# Patient Record
Sex: Female | Born: 1970 | Race: White | Hispanic: No | Marital: Married | Smoking: Never smoker
Health system: Southern US, Community
[De-identification: ages and names within clinical notes are randomized; demographics above are authoritative.]

## PROBLEM LIST (undated history)

## (undated) DIAGNOSIS — Z95810 Presence of automatic (implantable) cardiac defibrillator: Secondary | ICD-10-CM

## (undated) DIAGNOSIS — I472 Ventricular tachycardia: Secondary | ICD-10-CM

## (undated) DIAGNOSIS — R000 Tachycardia, unspecified: Secondary | ICD-10-CM

## (undated) DIAGNOSIS — F329 Major depressive disorder, single episode, unspecified: Secondary | ICD-10-CM

## (undated) DIAGNOSIS — F319 Bipolar disorder, unspecified: Secondary | ICD-10-CM

## (undated) DIAGNOSIS — T1491XA Suicide attempt, initial encounter: Secondary | ICD-10-CM

## (undated) DIAGNOSIS — I428 Other cardiomyopathies: Secondary | ICD-10-CM

## (undated) DIAGNOSIS — I10 Essential (primary) hypertension: Secondary | ICD-10-CM

## (undated) DIAGNOSIS — F419 Anxiety disorder, unspecified: Secondary | ICD-10-CM

## (undated) DIAGNOSIS — F32A Depression, unspecified: Secondary | ICD-10-CM

## (undated) DIAGNOSIS — Z9289 Personal history of other medical treatment: Secondary | ICD-10-CM

## (undated) DIAGNOSIS — M1990 Unspecified osteoarthritis, unspecified site: Secondary | ICD-10-CM

## (undated) HISTORY — DX: Tachycardia, unspecified: R00.0

## (undated) HISTORY — DX: Suicide attempt, initial encounter: T14.91XA

## (undated) HISTORY — DX: Unspecified osteoarthritis, unspecified site: M19.90

## (undated) HISTORY — DX: Other cardiomyopathies: I42.8

## (undated) HISTORY — PX: CARDIAC CATHETERIZATION: SHX172

## (undated) HISTORY — DX: Major depressive disorder, single episode, unspecified: F32.9

## (undated) HISTORY — PX: NEUROPLASTY / TRANSPOSITION MEDIAN NERVE AT CARPAL TUNNEL BILATERAL: SUR894

## (undated) HISTORY — DX: Ventricular tachycardia: I47.2

## (undated) HISTORY — DX: Depression, unspecified: F32.A

## (undated) HISTORY — DX: Anxiety disorder, unspecified: F41.9

---

## 1997-04-01 HISTORY — PX: TUBAL LIGATION: SHX77

## 1998-08-19 ENCOUNTER — Emergency Department (HOSPITAL_COMMUNITY): Admission: EM | Admit: 1998-08-19 | Discharge: 1998-08-19 | Payer: Self-pay | Admitting: Emergency Medicine

## 2001-05-07 ENCOUNTER — Encounter: Payer: Self-pay | Admitting: Emergency Medicine

## 2001-05-07 ENCOUNTER — Emergency Department (HOSPITAL_COMMUNITY): Admission: EM | Admit: 2001-05-07 | Discharge: 2001-05-07 | Payer: Self-pay | Admitting: Emergency Medicine

## 2001-12-22 ENCOUNTER — Inpatient Hospital Stay (HOSPITAL_COMMUNITY): Admission: EM | Admit: 2001-12-22 | Discharge: 2001-12-23 | Payer: Self-pay | Admitting: Emergency Medicine

## 2001-12-22 ENCOUNTER — Encounter: Payer: Self-pay | Admitting: Emergency Medicine

## 2002-04-18 ENCOUNTER — Emergency Department (HOSPITAL_COMMUNITY): Admission: EM | Admit: 2002-04-18 | Discharge: 2002-04-18 | Payer: Self-pay | Admitting: Internal Medicine

## 2002-04-18 ENCOUNTER — Encounter: Payer: Self-pay | Admitting: *Deleted

## 2002-07-06 ENCOUNTER — Encounter: Payer: Self-pay | Admitting: Emergency Medicine

## 2002-07-06 ENCOUNTER — Emergency Department (HOSPITAL_COMMUNITY): Admission: EM | Admit: 2002-07-06 | Discharge: 2002-07-06 | Payer: Self-pay | Admitting: Emergency Medicine

## 2002-07-14 ENCOUNTER — Ambulatory Visit (HOSPITAL_COMMUNITY): Admission: RE | Admit: 2002-07-14 | Discharge: 2002-07-14 | Payer: Self-pay | Admitting: Orthopaedic Surgery

## 2002-07-14 ENCOUNTER — Encounter: Payer: Self-pay | Admitting: Orthopaedic Surgery

## 2002-09-02 ENCOUNTER — Ambulatory Visit (HOSPITAL_COMMUNITY): Admission: RE | Admit: 2002-09-02 | Discharge: 2002-09-02 | Payer: Self-pay | Admitting: Orthopaedic Surgery

## 2002-09-02 ENCOUNTER — Encounter: Payer: Self-pay | Admitting: Orthopaedic Surgery

## 2002-09-27 ENCOUNTER — Emergency Department (HOSPITAL_COMMUNITY): Admission: EM | Admit: 2002-09-27 | Discharge: 2002-09-27 | Payer: Self-pay | Admitting: Emergency Medicine

## 2002-09-27 ENCOUNTER — Encounter: Payer: Self-pay | Admitting: Emergency Medicine

## 2002-10-14 ENCOUNTER — Encounter: Payer: Self-pay | Admitting: Family Medicine

## 2002-10-14 ENCOUNTER — Ambulatory Visit (HOSPITAL_COMMUNITY): Admission: RE | Admit: 2002-10-14 | Discharge: 2002-10-14 | Payer: Self-pay | Admitting: Family Medicine

## 2003-01-02 ENCOUNTER — Emergency Department (HOSPITAL_COMMUNITY): Admission: EM | Admit: 2003-01-02 | Discharge: 2003-01-02 | Payer: Self-pay | Admitting: Emergency Medicine

## 2003-01-02 ENCOUNTER — Encounter: Payer: Self-pay | Admitting: Emergency Medicine

## 2003-05-11 ENCOUNTER — Encounter (HOSPITAL_COMMUNITY): Admission: RE | Admit: 2003-05-11 | Discharge: 2003-06-10 | Payer: Self-pay | Admitting: Preventative Medicine

## 2003-05-30 ENCOUNTER — Ambulatory Visit (HOSPITAL_COMMUNITY): Admission: RE | Admit: 2003-05-30 | Discharge: 2003-05-30 | Payer: Self-pay | Admitting: Preventative Medicine

## 2003-06-30 ENCOUNTER — Ambulatory Visit (HOSPITAL_COMMUNITY): Admission: RE | Admit: 2003-06-30 | Discharge: 2003-06-30 | Payer: Self-pay | Admitting: Orthopaedic Surgery

## 2003-08-02 ENCOUNTER — Ambulatory Visit (HOSPITAL_COMMUNITY): Admission: RE | Admit: 2003-08-02 | Discharge: 2003-08-02 | Payer: Self-pay | Admitting: Orthopaedic Surgery

## 2003-09-12 ENCOUNTER — Encounter (HOSPITAL_COMMUNITY): Admission: RE | Admit: 2003-09-12 | Discharge: 2003-10-12 | Payer: Self-pay | Admitting: Orthopaedic Surgery

## 2003-09-22 ENCOUNTER — Emergency Department (HOSPITAL_COMMUNITY): Admission: EM | Admit: 2003-09-22 | Discharge: 2003-09-22 | Payer: Self-pay | Admitting: Emergency Medicine

## 2003-12-10 ENCOUNTER — Emergency Department (HOSPITAL_COMMUNITY): Admission: EM | Admit: 2003-12-10 | Discharge: 2003-12-10 | Payer: Self-pay | Admitting: Emergency Medicine

## 2004-02-06 ENCOUNTER — Ambulatory Visit (HOSPITAL_COMMUNITY): Admission: RE | Admit: 2004-02-06 | Discharge: 2004-02-06 | Payer: Self-pay | Admitting: Orthopaedic Surgery

## 2004-03-18 ENCOUNTER — Emergency Department (HOSPITAL_COMMUNITY): Admission: EM | Admit: 2004-03-18 | Discharge: 2004-03-18 | Payer: Self-pay | Admitting: Emergency Medicine

## 2004-08-19 ENCOUNTER — Emergency Department: Payer: Self-pay | Admitting: General Practice

## 2004-08-23 ENCOUNTER — Inpatient Hospital Stay (HOSPITAL_COMMUNITY): Admission: RE | Admit: 2004-08-23 | Discharge: 2004-08-28 | Payer: Self-pay | Admitting: Orthopaedic Surgery

## 2004-08-28 ENCOUNTER — Inpatient Hospital Stay: Admission: AD | Admit: 2004-08-28 | Discharge: 2004-08-30 | Payer: Self-pay | Admitting: Internal Medicine

## 2004-10-30 ENCOUNTER — Ambulatory Visit (HOSPITAL_COMMUNITY): Admission: RE | Admit: 2004-10-30 | Discharge: 2004-10-30 | Payer: Self-pay | Admitting: Orthopaedic Surgery

## 2005-05-30 ENCOUNTER — Emergency Department (HOSPITAL_COMMUNITY): Admission: EM | Admit: 2005-05-30 | Discharge: 2005-05-30 | Payer: Self-pay | Admitting: Emergency Medicine

## 2005-05-31 ENCOUNTER — Ambulatory Visit (HOSPITAL_COMMUNITY): Admission: RE | Admit: 2005-05-31 | Discharge: 2005-05-31 | Payer: Self-pay | Admitting: Emergency Medicine

## 2005-06-16 ENCOUNTER — Emergency Department (HOSPITAL_COMMUNITY): Admission: EM | Admit: 2005-06-16 | Discharge: 2005-06-16 | Payer: Self-pay

## 2005-08-29 ENCOUNTER — Emergency Department (HOSPITAL_COMMUNITY): Admission: EM | Admit: 2005-08-29 | Discharge: 2005-08-29 | Payer: Self-pay | Admitting: Emergency Medicine

## 2005-09-02 IMAGING — CR DG ANKLE COMPLETE 3+V*L*
1 series · 4 of 4 positions shown · non-contrast
Comparison: none

REASON FOR EXAM: Fall
COMMENTS:  LMP: 4 wks ago

[Series 1: view not recorded · 0.17mm/px · 4 of 4 slices shown]
[im 1/4]
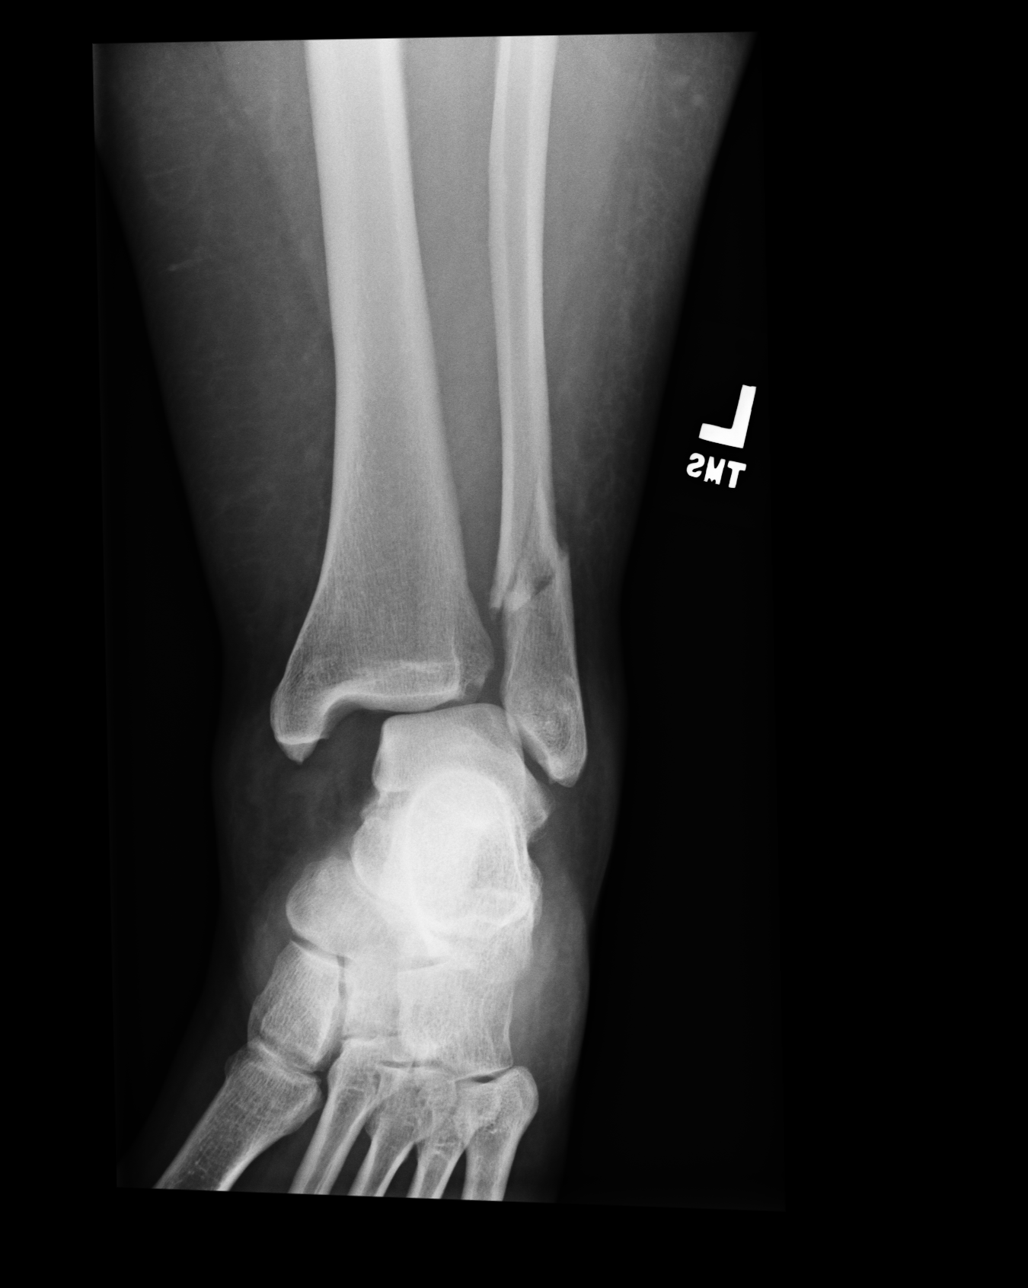
[im 2/4]
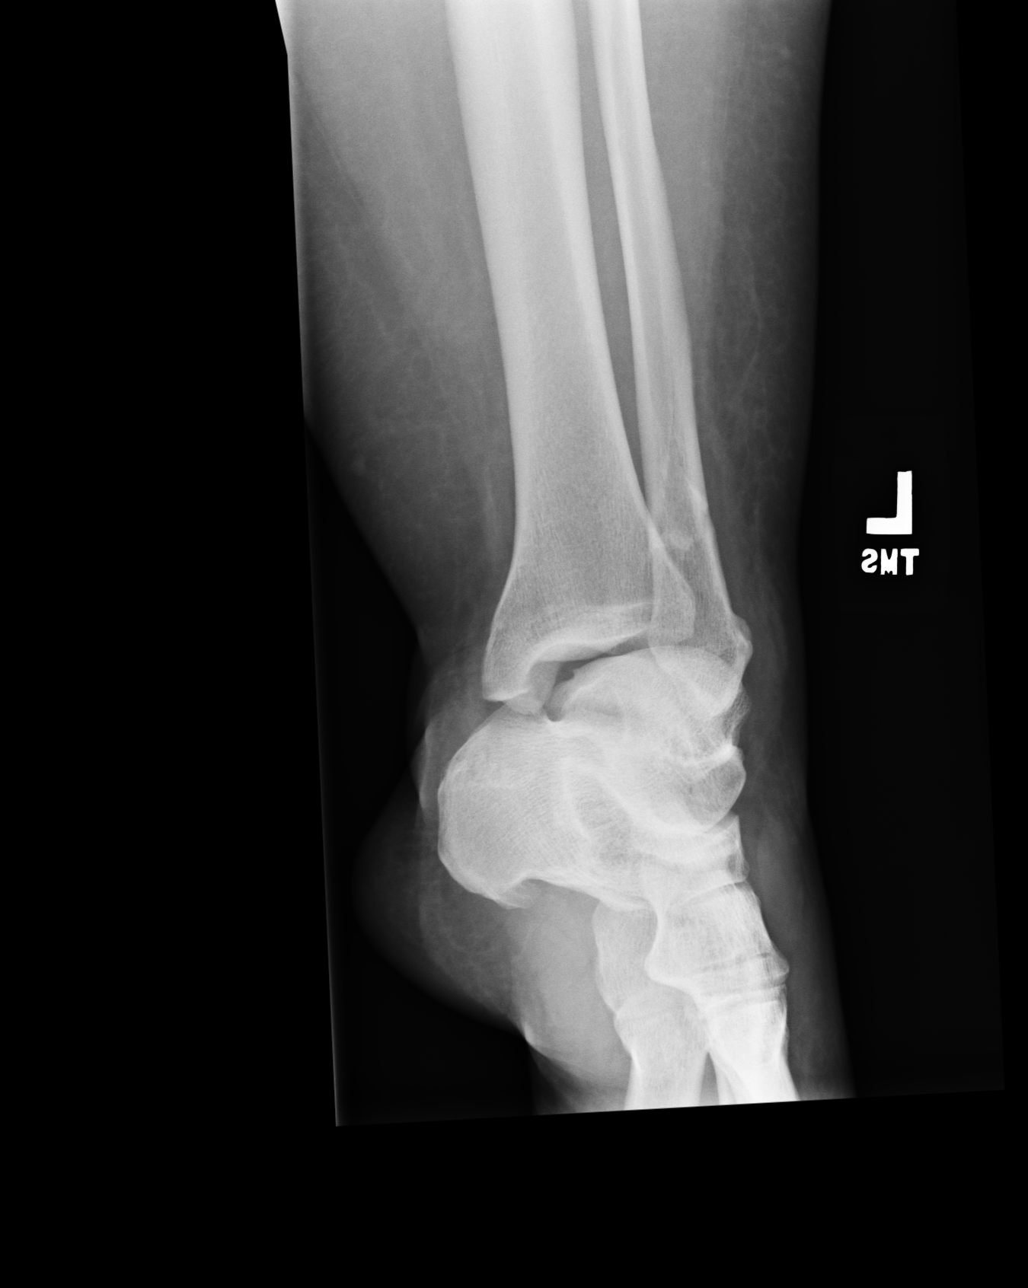
[im 3/4]
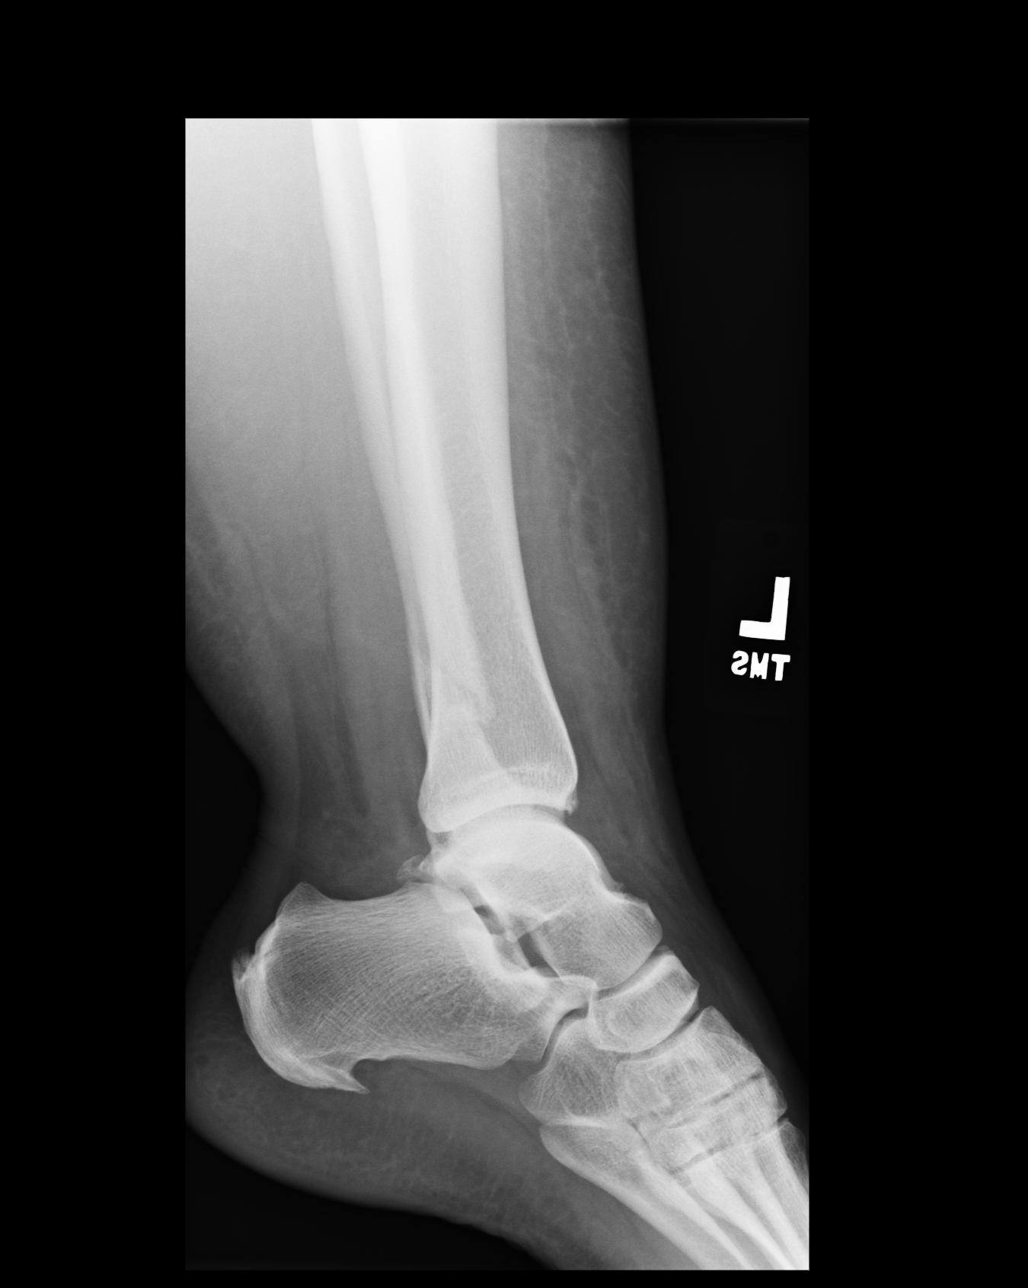
[im 4/4]
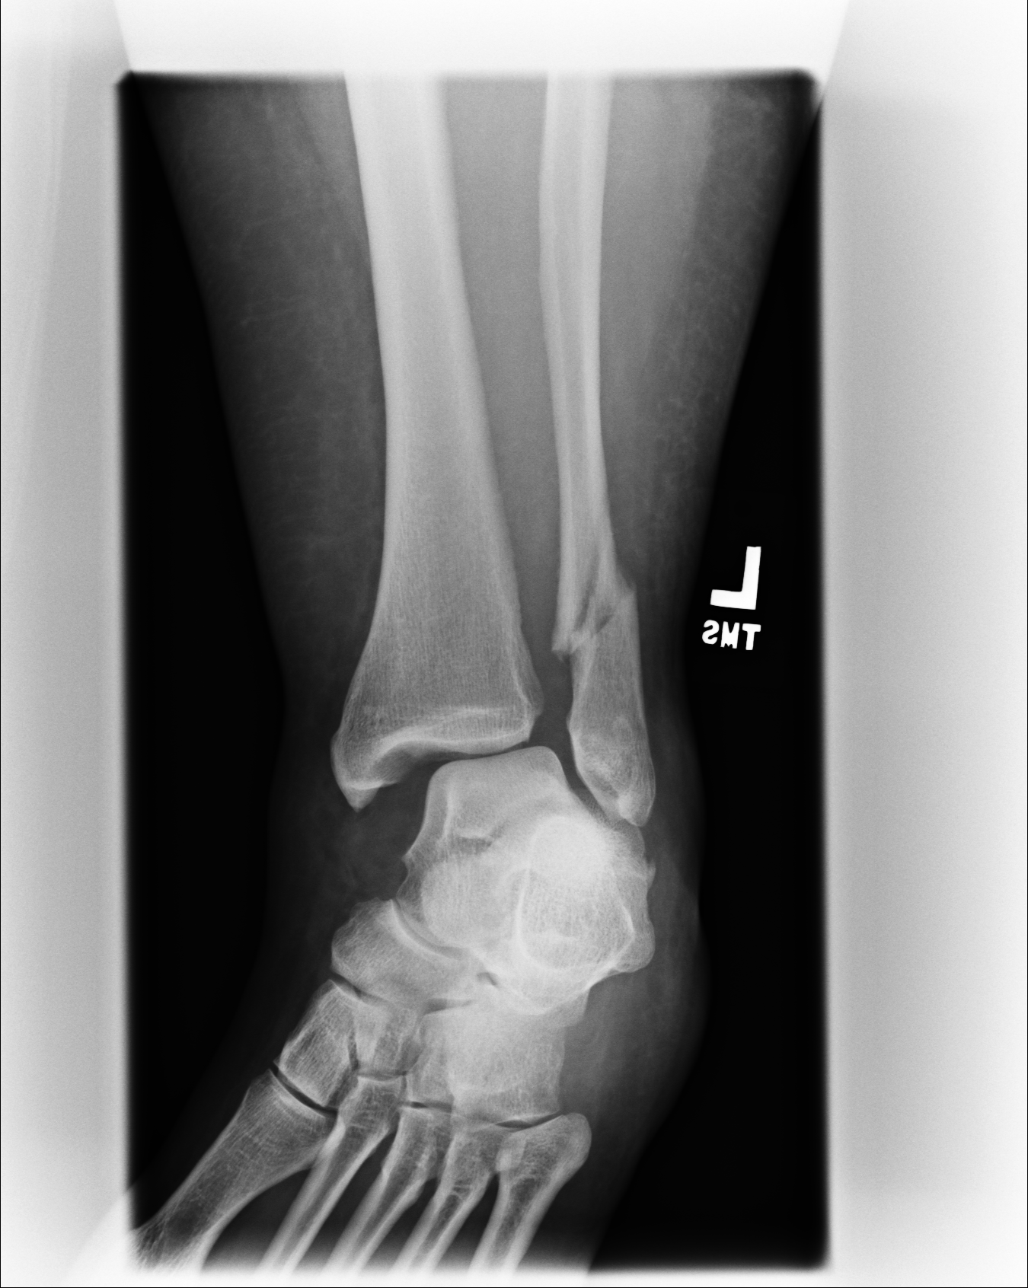

[4 of 4 positions shown; findings below may reference images not displayed]

PROCEDURE:     DXR - DXR ANKLE LEFT COMPLETE  - August 19, 2004  [DATE]

RESULT:     A comminuted spiral fracture is demonstrated along the distal
shaft of the fibula.  This demonstrates intraarticular involvement that
extends to the distal tibiofibular joint.  The distal fibula demonstrates
mild lateral displacement.  The tibia demonstrates medial displacement with
medial angulation.  There is destruction of the ankle mortise.  Degenerative
changes are demonstrated involving the calcaneus evident by spurring of the
Achilles insertion and along the plantar aspect of the calcaneus.  No
further fractures or dislocations are appreciated.
IMPRESSION: See above.

## 2005-09-07 ENCOUNTER — Emergency Department (HOSPITAL_COMMUNITY): Admission: EM | Admit: 2005-09-07 | Discharge: 2005-09-07 | Payer: Self-pay | Admitting: Emergency Medicine

## 2006-06-29 ENCOUNTER — Emergency Department (HOSPITAL_COMMUNITY): Admission: EM | Admit: 2006-06-29 | Discharge: 2006-06-29 | Payer: Self-pay | Admitting: Emergency Medicine

## 2008-07-14 ENCOUNTER — Emergency Department (HOSPITAL_COMMUNITY): Admission: EM | Admit: 2008-07-14 | Discharge: 2008-07-14 | Payer: Self-pay | Admitting: Emergency Medicine

## 2008-07-16 ENCOUNTER — Emergency Department (HOSPITAL_COMMUNITY): Admission: EM | Admit: 2008-07-16 | Discharge: 2008-07-16 | Payer: Self-pay | Admitting: Emergency Medicine

## 2009-09-17 ENCOUNTER — Emergency Department (HOSPITAL_COMMUNITY): Admission: EM | Admit: 2009-09-17 | Discharge: 2009-09-17 | Payer: Self-pay | Admitting: Emergency Medicine

## 2010-07-07 ENCOUNTER — Emergency Department (HOSPITAL_COMMUNITY)
Admission: EM | Admit: 2010-07-07 | Discharge: 2010-07-07 | Disposition: A | Discharge status: Home / Self Care | Payer: Medicaid Other | Attending: Emergency Medicine | Admitting: Emergency Medicine

## 2010-07-07 DIAGNOSIS — IMO0002 Reserved for concepts with insufficient information to code with codable children: Secondary | ICD-10-CM | POA: Insufficient documentation

## 2010-07-07 DIAGNOSIS — N61 Mastitis without abscess: Secondary | ICD-10-CM | POA: Insufficient documentation

## 2010-07-11 LAB — CULTURE, ROUTINE-ABSCESS

## 2010-07-11 LAB — GLUCOSE, CAPILLARY: Glucose-Capillary: 98 mg/dL (ref 70–99)

## 2010-08-17 ENCOUNTER — Emergency Department (HOSPITAL_COMMUNITY)
Admission: EM | Admit: 2010-08-17 | Discharge: 2010-08-17 | Disposition: A | Discharge status: Home / Self Care | Payer: Medicaid Other | Attending: Emergency Medicine | Admitting: Emergency Medicine

## 2010-08-17 DIAGNOSIS — I10 Essential (primary) hypertension: Secondary | ICD-10-CM | POA: Insufficient documentation

## 2010-08-17 DIAGNOSIS — IMO0002 Reserved for concepts with insufficient information to code with codable children: Secondary | ICD-10-CM | POA: Insufficient documentation

## 2010-08-17 DIAGNOSIS — Z79899 Other long term (current) drug therapy: Secondary | ICD-10-CM | POA: Insufficient documentation

## 2010-08-17 DIAGNOSIS — F319 Bipolar disorder, unspecified: Secondary | ICD-10-CM | POA: Insufficient documentation

## 2010-08-17 NOTE — Discharge Summary (Signed)
NAME:  Karla Anderson, Karla Anderson             ACCOUNT NO.:  [id number]   MEDICAL RECORD NO.:  [id number]          PATIENT TYPE:  INP   LOCATION:  A330                          FACILITY:  [facility]   PHYSICIAN:  Vickki Hearing, M.D.DATE OF BIRTH:  Jul 29, 1970   DATE OF ADMISSION:  08/23/2004  DATE OF DISCHARGE:  05/30/2006LH                                 DISCHARGE SUMMARY   ADMISSION DIAGNOSES:  1.  Weber C type fracture, left ankle with lateral malleolus fracture.  2.  Syndesmosis rupture and medial deltoid ligament tear.   DISCHARGE DIAGNOSES:  1.  Weber C type fracture, left ankle with lateral malleolus fracture.  2.  Syndesmosis rupture and medial deltoid ligament tear.   OPERATIVE PROCEDURE:  Open treatment internal fixation of left ankle with  syndesmosis screw placement and repair of medial deltoid ligament.   SURGEON:  J. Darreld Mclean, M.D.   ASSISTANT:  Lolita Cram, [facility].   ANESTHESIA:  General.   HISTORY OF PRESENT ILLNESS:  This is a 40 year old white female admitted by  Dr. Hilda Lias for fracture left ankle. The patient was seen in the office of  Dr. Hilda Lias on Aug 21, 2004. She injured her ankle on Aug 18, 2004. She was  stepping out of a door at home that had some new construction work and fell.  The patient has other medical problems including irregular heart beat,  hypertension, and depression/anxiety. Has a history of carpal tunnel release  on the right and left by Dr. Hilda Lias, bilateral tubal ligation, dental  surgery and cesarean section.   ADMISSION MEDICATIONS:  She was on Naprosyn, Vicodin ES, Zoloft, Topamax,  and Geodon.   ALLERGIES:  None.   HOSPITAL COURSE:  The patient was admitted on the date stated. Underwent  surgery on Aug 23, 2004. Had a lateral plate and repair of the medial  deltoid ligament and a syndesmosis screw placed. She tolerated the procedure  well and was brought back to the floor. Prior to surgery a medical  consultation was done to assist  in the patient's medical problems and their  management. His consultation was done by Dr. Vania Rea. She  progressed well with physical therapy and at the time of discharge, was  afebrile. Vital signs were stable. The pain was reasonably controlled with  Lortab 1.5 tablets every 4 hours for pain.   DISCHARGE MEDICATIONS:  1.  Tenormin 25 mg daily.  2.  Lovenox 40 mg subcutaneous q. 24. This should continue for a period of      20 days.  3.  Lorcet Plus 1 every 4 hours for pain.  4.  Zoloft 150 mg daily orally.  5.  Topamax 150 mg orally b.i.d.  6.  Geodon 60 mg daily at night.  7.  Ambien 10 mg orally q.h.s.   FOLLOW UP:  This patient should be seen in followup by Dr. Hilda Lias in  approximately 7 days.   DISPOSITION:  The patient is discharged to the [facility].   SPECIAL INSTRUCTIONS:  She is non-weight bearing. She should see physical  therapy daily for gait assistance and training. While recumbent,  legs should  be elevated. Any questions about the patient's care should be referred to  [phone], Dr. Sanjuan Dame office.   CONDITION ON DISCHARGE:  Overall condition is improved. Ambulatory with a  walker and standby assistance.      SEH/MEDQ  D:  08/28/2004  T:  08/28/2004  Job:  [id number]   cc:   Teola Bradley, M.D.  [phone] [address]  [place] [address]  Fax: [phone]   Vickki Hearing, M.D.  Fax: [phone]   [facility]

## 2010-08-17 NOTE — Op Note (Signed)
NAME:  Karla Anderson, Karla Anderson             ACCOUNT NO.:  [id number]   MEDICAL RECORD NO.:  [id number]          PATIENT TYPE:  AMB   LOCATION:  DAY                           FACILITY:  [facility]   PHYSICIAN:  J. Darreld Mclean, M.D. DATE OF BIRTH:  September 25, 1970   DATE OF PROCEDURE:  08/23/2004  DATE OF DISCHARGE:                                 OPERATIVE REPORT   PREOPERATIVE DIAGNOSES:  1.  Fracture lateral malleolus of the left ankle.  2.  Syndesmosis rupture, left ankle.  3.  Medial deltoid ligament tear, left ankle.   POSTOPERATIVE DIAGNOSES:  1.  Fracture lateral malleolus of the left ankle.  2.  Syndesmosis rupture, left ankle.  3.  Medial deltoid ligament tear, left ankle.   OPERATION/PROCEDURE:  1.  OTIR left ankle.  2.  Syndesmosis screw placement.  3.  Repair of the medial deltoid ligament.   SURGEON:  J. Darreld Mclean, M.D.   ASSISTANT:  Lolita Cram, P.A-C.   ANESTHESIA:  General.   DRAINS:  No drains used.  Posterior spinal applied.   INDICATIONS FOR PHYSICIAN ASSISTANT:  This is a small community hospital.  There is no house staff.  The use of a physician's assistant is medically  indicated for this rather difficult procedure.   INDICATIONS FOR PROCEDURE:  The patient is a 40 year old female, morbidly  obese. Sustained the above-mentioned injury over the weekend.  She was seen  at an outlying hospital.  We saw her in the office on Tuesday.  The patient  has significant injury.  She is morbidly obese and was tentatively scheduled  for bariatric-type surgery at [facility] later this year.  She is at high risk.  Risks and imponderables of the procedure were discussed with the patient in  detail.  The patient appeared to understand and agreed to the procedure as  outlined.   DESCRIPTION OF PROCEDURE:  The patient was seen in the holding area  identified.  The left ankle was identified as the surgical site.  The  patient had already placed a mark. I placed a mark.  The patient was  brought  to the operating room and given general anesthesia while supine. A  tourniquet placed, deflated, on the left upper thigh.  Prepped and draped in  the usual manner.  Time out was held.  Identified the left ankle as the  correct surgical site and identified the patient as Karla Anderson.  The leg  was then elevated and wrapped circumferentially with an Esmarch bandage,  tourniquet inflated to 350 mmHg.  Esmarch bandage was removed.  The incision  was made medially.  The deltoid ligament tear was identified. Tags were then  placed with #1 pop-off Surgilon suture laterally.  This was opened and  fracture was identified, reduced and a 7-hole plate was used with a locking  screw proximal.  The metal screw hole was left without a screw in it, just  at the fracture site.  I was going to use this as a syndesmosis screw.   Attention directed to the medial side.  Pulled back the medial deltoid  ligament with repair.  We placed this syndesmosis screw. It was a little low  and I repositioned it more anteriorly.  It was brought in the syndesmosis.  However, the medial joint mortise was still a little widened and we opened  up the repair slightly, looked in.  There was nothing blocking it.  There  were no fragments.  We reclosed this.  Wounds were reapproximated using 2-00  chromic and 0 chromic, 2-0 plain and skin staples on both sides.  Tourniquet  was left one hour and 20 minutes.  Bulky dressing applied.  Posterior splint  applied.  The patient tolerated the procedure well and went to recovery in  good condition.  She is admitted.      JWK/MEDQ  D:  08/23/2004  T:  08/23/2004  Job:  [id number]

## 2010-08-17 NOTE — H&P (Signed)
NAME:  Karla Anderson, Karla Anderson                       ACCOUNT NO.:  [id number]   MEDICAL RECORD NO.:  [id number]                   PATIENT TYPE:  EMS   LOCATION:  ED                                   FACILITY:  [facility]   PHYSICIAN:  Gracelyn Nurse, M.D.              DATE OF BIRTH:  02/14/1971   DATE OF ADMISSION:  12/22/2001  DATE OF DISCHARGE:                                HISTORY & PHYSICAL   CHIEF COMPLAINT:  Chest pain and palpitations.   HISTORY OF PRESENT ILLNESS:  This is a 40 year old white female who has a  known history of supraventricular tachycardia.  She presents today with a  one-day history of chest pain and palpitations.  She said this came on after  an argument with her ex-husband.  She has been taking Cardizem in the past  to control this, but she has been out of these medications for about a month  now.  She has no other symptoms associated with this.   PAST MEDICAL HISTORY:  1. Supraventricular tachycardia.  2. Heart murmur.  3. Hypertension.  4. Hyperlipidemia.  5. Status post C-section x 1.   ALLERGIES:  No known drug allergies.   MEDICATIONS:  1. Cardizem, dose unknown.  2. Benicar, dose unknown.   SOCIAL HISTORY:  She stopped smoking cigarettes two years ago.  Drinks  alcohol occasionally.  Does no illicit drugs.  She is divorced.  One child.  Works at [employer].   FAMILY HISTORY:  Her father is age 71.  He has had an MI and a stroke.  Mother, age 15, is generally healthy.  She has one sister who is generally  healthy.   REVIEW OF SYSTEMS:  As per HPI.  Also significant for weight gain, stress,  chronic nausea.  All other systems are reviewed and are normal.   PHYSICAL EXAMINATION:  VITAL SIGNS:  Temperature 100.7, pulse 145,  respirations 20, blood pressure 143/70.  GENERAL:  This is a morbidly obese white female in no acute distress.  HEENT:  Pupils are equal, round, and reactive to light.  Extraocular  movements intact.  Oral mucosa is moist.   Oropharynx is clear.  Poor  dentition.  CARDIOVASCULAR:  Tachycardic.  Very distant heart sounds.  There does appear  to be a 1/6 systolic murmur.  LUNGS:  Clear to auscultation.  ABDOMEN:  Obese.  Nontender, nondistended.  Bowel sounds are positive.  EXTREMITIES:  With 1+ lower extremity edema.  NEUROLOGIC:  Cranial nerves II-XII grossly intact.  Strength is 5/5  bilaterally.  SKIN:  Moist.  No rashes.   ADMISSION LABORATORY DATA:  White blood count 9.7, hemoglobin 12.2,  platelets 242.  Sodium 137, potassium 4.2, chloride 105, CO2 27, BUN 6,  creatinine 1.0, glucose 115.  CK is 124 with an MB fraction of 0.6 and  troponin I is 0.02.   EKG shows an supraventricular tachycardia with a rate of  124 and what  appears to be a left bundle branch block.   ASSESSMENT AND PLAN:  1. Supraventricular tachycardia:  The patient was given IV bolus of Cardizem     in the ER and placed on a Cardizem drip.  Heart rate has slowed down to     the low 100s.  Chest pain has been relieved.  Will go ahead and start the     patient on oral Cardizem and wean her off the drip.  I will go ahead and     cycle her cardiac enzymes, though I have low suspicion that this was from     coronary artery disease.  Will go ahead and consult cardiology since she     is followed by Dr. Graciela Husbands at [facility].  2. Hypertension:  Will monitor it once Cardizem is restarted as it appeared     to be controlled on that in the past.  3. Low-grade fever:  Will check a chest x-ray and a UA.                                               Gracelyn Nurse, M.D.    JDJ/MEDQ  D:  12/22/2001  T:  12/22/2001  Job:  [id number]

## 2010-08-17 NOTE — Op Note (Signed)
NAME:  Karla Anderson, Karla Anderson                       ACCOUNT NO.:  [id number]   MEDICAL RECORD NO.:  [id number]                   PATIENT TYPE:  AMB   LOCATION:  DAY                                  FACILITY:  [facility]   PHYSICIAN:  J. Darreld Mclean, M.D.              DATE OF BIRTH:  [birth year]   DATE OF PROCEDURE:  DATE OF DISCHARGE:                                 OPERATIVE REPORT   PREOPERATIVE DIAGNOSIS:  Carpal tunnel syndrome, left.   POSTOPERATIVE DIAGNOSIS:  Carpal tunnel syndrome, left.   PROCEDURE:  1. Release of volar carpal ligament, saline neurolysis.  2. Aponeurotomy left median nerve.   ANESTHESIA:  Bier block--please refer to anesthesia record for tourniquet  time.   SURGEON:  J. Darreld Mclean, M.D.   ASSISTANT:  Candace Cruise, [facility].   INDICATIONS:  The patient has pain and tenderness in the left hand, median  nerve distribution.  Nerve conduction velocities and EMGs showed carpal  tunnel syndrome bilaterally.  She underwent right carpal tunnel release  several weeks ago and now is here for her left.  She understands the  procedure, risks, and imponderables.   DESCRIPTION OF PROCEDURE:  The patient was placed supine on the operating  room table with the hand table attached.  The patient had Bier block  anesthesia given by anesthesia.  She was prepped and draped in the usual  manner.  A __________ incision was made.  We identified the left side as the  correct site; and identified the patient was Karla Anderson.   Incision was made with careful dissection; the median nerve was identified  proximally.  A vessel loop placed around the median nerve.  Saline  neurolysis aponeurotomy carried out.  The volar carpal ligament was incised.  Before doing this __________ .  The retinaculum was cut proximally.  A  specimen of the volar carpal ligament sent to pathology.   Nerve was inspected; no apparent injury.  The wound was then reapproximated  using 3-0 Nylon in an interrupted  vertical mattress manner.  Sterile  dressing applied; bulky dressing applied; sheet cotton applied.  Sheet  cotton cut  dorsally.  Volar plaster splint applied.  Ace bandage applied, loosely.  The  patient tolerated the procedure well and went to recovery in good condition.  Prescription for Vicodin ES given for pain.  I will see her in the office in  approximately 10 days to 2 weeks; if any difficulty, she is to contact me  through the office hospital beeper system.      ___________________________________________                                            Teola Bradley, M.D.   JWK/MEDQ  D:  08/02/2003  T:  08/02/2003  Job:  [id number]

## 2010-08-17 NOTE — Procedures (Signed)
[facility]  Patient:    Karla Anderson, Karla Anderson Visit Number: [id number] MRN: [id number]          Service Type: EMS Location: ED Attending Physician:  Herbert Seta Dictated by:   Kari Baars, M.D. Admit Date:  05/07/2001 Discharge Date: 05/07/2001                            EKG Interpretations  TIME/DATE:  1809, May 07, 2001.  The rhythm is a tachycardic rhythm with a rate about 153.  There appears to be a left bundle branch block.  This is a regular rhythm, and I do not believe this represents ventricular tachycardia, but that is a possibility, and clinical correlation is suggested.  IMPRESSION:  Abnormal electrocardiogram. Dictated by:   Kari Baars, M.D. Attending Physician:  Herbert Seta DD:  05/08/01 TD:  05/09/01 Job: [id number] XL/KG401

## 2010-08-17 NOTE — Op Note (Signed)
NAME:  Karla Anderson, Karla Anderson             ACCOUNT NO.:  [id number]   MEDICAL RECORD NO.:  [id number]          PATIENT TYPE:  AMB   LOCATION:  DAY                           FACILITY:  [facility]   PHYSICIAN:  J. Darreld Mclean, M.D. DATE OF BIRTH:  1970-08-17   DATE OF PROCEDURE:  10/30/2004  DATE OF DISCHARGE:                                 OPERATIVE REPORT   PREOPERATIVE DIAGNOSIS:  Status post fracture of the left ankle, syndesmosis  rupture.   POSTOPERATIVE DIAGNOSIS:  Status post fracture of the left ankle,  syndesmosis rupture.   PROCEDURE:  Removal of syndesmotic screw of the left ankle.   ANESTHESIA:  General.   SURGEON:  Dr. Hilda Lias.   SPECIMENS:  Removed orthopedic screw.   TOURNIQUET TIME:  12 minutes.   DRAINS:  No drains.   INDICATIONS:  The patient is a 40 year old female who is approximately eight  weeks status post syndesmotic rupture of the left ankle who fracture the  lateral malleolus and deltoid ligament rupture. She had repair of all of  these. She is now for removal of the syndesmosis screw. The patient is  morbidly obese and has no other significant medical problems. She is  medically stable and able to undergo the procedure. Risks and imponderables  of the procedure were expressed. The patient appeared to understand. Labs  were within normal limits.   DESCRIPTION OF PROCEDURE:  The patient was seen in the holding area. The  left ankle was identified as the correct surgical site. She placed a mark  over the area and so did I. She was brought back to the operating room and  placed supine on operating table and was given general anesthesia.  Tourniquet placed deflated in the left upper thigh. Prepped and draped in  the usual manner. We had a time out and identified the patient and  identified we were doing the left ankle for removal of the syndesmosis  screw. The leg was elevated, wrapped circumferentially with an Esmarch  bandage, tourniquet was inflated. Esmarch  bandage was removed. With the use  of a C arm, I was able to place a metal device near where the screw was and  made an incision. I made an incision over the skin. There was some what I  could not tell if it was purulent material, but it was not normal appearing  material, and I went ahead and got a culture of that. Screw was identified,  dissected down so I could see the screw, and I removed the screw. Then I  placed some orthopedic putty, bone graft type, in the screw hole. Wounds  were reapproximated using 2-0 chromic and skin staples. Tourniquet deflated  for 12 minutes. Sterile dressing applied. Bulky dressing applied. Ace  bandage  applied. She tolerated the procedure well and will go to recovery in good  condition. She can bear weight on it as tolerated. She has pain medications  at home. We will give her a prescription for Levaquin, pending the results  of the culture.       JWK/MEDQ  D:  10/30/2004  T:  10/30/2004  Job:  [id number]

## 2010-08-17 NOTE — H&P (Signed)
NAME:  Karla Anderson, Karla Anderson                       ACCOUNT NO.:  [id number]   MEDICAL RECORD NO.:  [id number]                   PATIENT TYPE:  AMB   LOCATION:  DAY                                  FACILITY:  [facility]   PHYSICIAN:  J. Darreld Mclean, M.D.              DATE OF BIRTH:  08-30-1970   DATE OF ADMISSION:  DATE OF DISCHARGE:                                HISTORY & PHYSICAL   CHIEF COMPLAINT:  Carpal tunnel syndrome.   Patient is a 40 year old female with pain and tenderness in both hands with  increasing numbness in both hands.  She is right-hand dominant.  She has  been having paresthesias and pain at night and getting progressively worse.  I had seen her in the office recently for low back pain and neck pain.  Because of associated __________ in both hands, I thought she had carpal  tunnel syndrome.  She underwent nerve conduction velocities in Dr. Chriss Driver  office on March 25th.  That shows abnormal nerve conduction velocities  indicative of carpal tunnel syndrome bilaterally, moderate, in both wrists.  Patient says the right hand hurts more than the left and she will have to  have her right hand done first.   Patient has a history of hypertension.  She complains at times that her  heart will race.  She has not been diagnosed as having any significant heart  problems.   CURRENT MEDICATIONS:  1. Zoloft 50 mg once a day.  2. Trazodone 100 mg once daily.  3. Topamax 25 mg 2 tablets b.i.d.  4. Dayspan 5 mg 1 every 6 hours p.r.n. back spasm.  5. Vicodin ES 1 every 4 hours p.r.n. pain.   She denies any allergies.   The patient does not smoke.  She denies alcoholic beverages.  She has a GED  degree.  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   She had a C-section in 1997 and dental surgeries in 2004 as her only  surgeries.   The family has a history of heart disease.  Her father had a stroke.   The patient is divorced.  Lives here in  [place].  Is currently  unemployed.   PHYSICAL EXAMINATION:  VITAL SIGNS:  BP 142/92, pulse 78, respirations 18.  Afebrile.  Height 5 feet 4-1/4.  She weighs over 350 pounds.  GENERAL:  Patient is alert and cooperative.  HEENT:  Negative.  NECK:  Supple.  LUNGS:  Clear to P&A.  HEART:  Regular rate and rhythm without murmur heard.  ABDOMEN:  Negative without masses felt except morbidly obese.  EXTREMITIES:  Decreased sensation in both hands and in the median nerve  distribution.  Positive Tinel's.  Positive Phalen's.  Left and right appear  to be equal.  Range of motion is good.  MUSCULOSKELETAL:  She has lower back pain and neck pain.  NEUROLOGIC:  Reflexes are normal.  DTRs  are normal.  SKIN:  Intact.   IMPRESSION:  1. Bilateral carpal tunnel syndrome, right greater than left.  2. History of low back pain.  3. History of neck pain.  4. History of hypertension.  5. Morbidly obese.   I have discussed with patient the plan and procedure of carpal tunnel  release as an outpatient on the right hand.  She appears to understand the  procedure.  I have discussed the risks and imponderables, including  infection, nerve injury, need for splint, need to keep it dry.  I have  discussed with her about a Bier block anesthesia.  She appears to understand  and agrees to the procedure as outlined.  Labs are pending.     ___________________________________________                                         Teola Bradley, M.D.   JWK/MEDQ  D:  06/29/2003  T:  06/29/2003  Job:  [id number]

## 2010-08-17 NOTE — Consult Note (Signed)
NAME:  Karla Anderson, Karla Anderson                       ACCOUNT NO.:  [id number]   MEDICAL RECORD NO.:  [id number]                   PATIENT TYPE:  INP   LOCATION:  A209                                 FACILITY:  [facility]   PHYSICIAN:  Gerrit Friends. Dietrich Pates, M.D. [facility]        DATE OF BIRTH:  Jul 13, 1970   DATE OF CONSULTATION:  12/22/2001  DATE OF DISCHARGE:                                   CONSULTATION   HISTORY OF PRESENT ILLNESS:  A 40 year old woman previously evaluated by Dr.  Sherryl Manges for a rate-related left bundle branch block, now admitted with  palpitations and chest pain.  The patient was last seen in September 2002,  at which time her symptoms appeared to be relatively well controlled with  atenolol 100 mg b.i.d.  She discontinued this medication a few months ago  without any dramatic change in her symptomatology.  She does have fairly  frequent palpitations.  On an event recorder obtained in 2002, symptoms were  not correlated with her rhythm.  She was admitted to [facility]  through the emergency department after developing recurrent symptoms during  psychologic stress.  She has done well in the hospital but continues to  report palpitations when her rhythm is normal sinus and sinus tachycardia at  a rate of approximately 100 beats per minute.  She has once again  demonstrated her rate-related left bundle,which occurs at a heart rate of  approximately 140.   PAST MEDICAL HISTORY:  Notable for hypertension that has been well  controlled with medical therapy.  She has a known heart murmur.  She has  been obese for some time.  An echocardiogram performed at an outside  physician's office in April 2002 was interpreted as normal.   SOCIAL HISTORY:  Divorced with one daughter; works as a Geophysicist/field seismologist.  Minimal tobacco use which was discontinued two years ago.  No excessive use  of alcohol.  She does have substantial caffeine intake.   FAMILY HISTORY:  Negative for  coronary disease.   REVIEW OF SYSTEMS:  Negative except for some dental problems.   PHYSICAL EXAMINATION:  GENERAL:  A pleasant overweight woman with a shy  demeanor.  VITAL SIGNS:  Heart rate 80 and regular, respirations 18, blood pressure  140/70.  NECK:  No jugular venous distention.  LUNGS:  Clear.  CARDIAC:  Grade 2/6 basilar systolic ejection murmur.  ABDOMEN:  Soft and nontender.  EXTREMITIES:  No edema; normal distal pulses.  ENDOCRINE:  No thyromegaly.  SKIN:  No significant lesions.   LABORATORY DATA:  Electrocardiograms were reviewed; her initial tracing  shows sinus tachycardia with a left bundle branch block.  Subsequent  tracings are normal.   Laboratory notable for hematuria.   IMPRESSION:  No paroxysmal supraventricular tachycardia has been documented  in the past or during this admission.  She once again does demonstrate rate-  related left bundle branch block.  In her case, this is  a benign condition.  I would recommend using beta-blocker on a p.r.n. basis for symptoms.  Benzodiazepines might be helpful as well.  She does not require any  additional hospitalization nor testing.  We will be happy to continue to  assist in this nice woman's care as needed.  A TSH level is pending at the  time of this dictation.                                                Gerrit Friends. Dietrich Pates, M.D. [facility]    RMR/MEDQ  D:  12/22/2001  T:  12/23/2001  Job:  [id number]

## 2010-08-17 NOTE — H&P (Signed)
NAME:  Karla Anderson, Karla Anderson             ACCOUNT NO.:  [id number]   MEDICAL RECORD NO.:  [id number]          PATIENT TYPE:  AMB   LOCATION:  DAY                           FACILITY:  [facility]   PHYSICIAN:  J. Darreld Mclean, M.D. DATE OF BIRTH:  07-30-1970   DATE OF ADMISSION:  DATE OF DISCHARGE:  LH                                HISTORY & PHYSICAL   CHIEF COMPLAINT:  Fracture of the left ankle.   HISTORY OF PRESENT ILLNESS:  The patient is a 40 year old white female who  was to be [facility] to [facility] to undergo removal of syndesmosis screw  from the left ankle.  She is now 8 weeks status post open reduction internal  fixation of a left ankle fracture.  Over the past 8 weeks she has done very  well.  X-rays now show the syndesmosis ligament has healed as well as the  lateral malleolus fracture to the left ankle.   It is now time to start physical therapy and the necessity to remove the  syndesmosis screw from the left ankle.   PAST MEDICAL HISTORY:  Positive for hypertension, depression, anxiety,  irregular heartbeat.   OPERATIONS:  C-section, dental surgery, bilateral tubal ligation, carpal  tunnel syndrome, right and left hand.   MEDICATIONS:  1.  Naprosyn 500 mg b.i.d. p.c.  2.  Flexeril 10 mg q.8h. p.r.n. for muscle spasm.  3.  Ambien 10 mg at bedtime.  4.  Geodon 80 mg 1 tab daily.  5.  Zoloft 100 mg 1 tab daily.  6.  Vicodin ES  1 tab q.4h. p.r.n. for pain.  7.  Tenormin 25 mg daily.   ALLERGIES:  None.   FAMILY/SOCIAL HISTORY:  The patient is divorced.  She has one child.  She  does not use alcohol or tobacco products.  The father has coronary artery  disease and has recently had a stroke.  The mother has a history of  congestive heart failure.  A sister has bipolar disease.   The patient has a GED diploma.   REVIEW OF SYSTEMS:  The patient is followed at the mental health center for  her depression and anxiety.   PHYSICAL EXAMINATION:  GENERAL APPEARANCE:  The  patient is 5 feet 4 inches  and weighs 325 pounds, alert and oriented times three. In general the  patient is an obese white female who is very pleasant and cooperative to  work with.  VITAL SIGNS:  Pulse 80, respirations 20.  The patient is afebrile.  HEENT:  Normal.  The patient has only one central incisor.  Otherwise she is  edentulous.  NECK:  Supple.  No thyromegaly or masses are palpated.  LUNGS:  Clear to A&P.  HEART:  Regular rhythm without murmur.  No organomegaly or masses are  palpated.  Normal bowel sounds are auscultated.  EXTREMITIES:  The left lower extremity is in a posterior splint.  She has  two surgical incisions to the ankle.  The more medial one is a 5-x-5-mm in  the mid portion of the ankle itself.  Laterally the wound is well healed.  There are signs of old fracture and blister to the ankle and foot, and  swelling to the ankle and foot.  Neurovascular is intact.  Range of motion  is limited secondary to immobilization.  The other extremities are within  normal limits.  Neurovascular is intact to them.  CNS:  Intact.  SKIN:  Intact.   IMPRESSION:  1.  Eight weeks status post lateral malleolus fracture and open treatment      and internal fixation thereof.  2.  Eight weeks status post syndesmosis ligament rupture, healed.  3.  Deltoid ligament rupture, left ankle, healed.  4.  A history of depression and hypertension, irregular heartbeat.   PLAN:  The patient is to be admitted to [facility] on October 30, 2004 to  undergo removal of syndesmosis screw from the left ankle.  Labs are pending.      Br   BC/MEDQ  D:  10/24/2004  T:  10/24/2004  Job:  [id number]

## 2010-08-17 NOTE — H&P (Signed)
NAME:  Karla Anderson, Karla Anderson                       ACCOUNT NO.:  [id number]   MEDICAL RECORD NO.:  [id number]                   PATIENT TYPE:  AMB   LOCATION:  DAY                                  FACILITY:  [facility]   PHYSICIAN:  J. Darreld Mclean, M.D.              DATE OF BIRTH:  May 29, 1970   DATE OF ADMISSION:  DATE OF DISCHARGE:                                HISTORY & PHYSICAL   CHIEF COMPLAINT:  Pain and tenderness in the left hand.   HISTORY OF PRESENT ILLNESS:  The patient is a 40 year old white female who  is being admitted through Day Surgery on Aug 02, 2003 to undergo release of  carpal tunnel of the left hand.   The patient has been followed here in the office, for the past several  months, because of carpal tunnel syndrome bilateral hands.  NCVs and EMGs  have shown that she has moderate carpal tunnel syndrome in the bilateral  hands. She most recently underwent carpal tunnel release in her right hand  and is doing very well postoperatively.  She has developed increased pain,  tenderness, and nocturnal numbness and tingling in the left hand.  She  denies carpal tunnel release of the left hand.   She underwent original procedure in the right hand a month ago.  She  __________ with preoperative as well as postoperative complications and  protocol.   PAST MEDICAL HISTORY:  Positive for irregular heart beat, hypertension,  depression, and exogenous obesity.  She denies diabetes mellitus, stroke,  cardiac or respiratory problems.   OPERATIONS:  C-section, bilateral tubal ligation, carpal tunnel release  right hand, dental work.   MEDICATIONS:  1. Zoloft 50 mg 1 tab daily.  2. Trazodone 100 mg every day.  3. Topamax 100 mg b.i.d.  4. __________ , antihypertensive medication, dosage unknown.   LOCAL MEDICAL DOCTOR:  She is followed up by the [facility]  physicians.   FAMILY AND SOCIAL HISTORY:  The patient is divorced.  She does not use  alcohol or tobacco  products.  Her father has had an MI and a stroke, but is  still living. Mother suffered a bout of congestive heart failure, but is  also living. She has 1 sister who has bipolar personality.   REVIEW OF SYSTEMS:  Review of systems is negative except for the following:  The patient has had a history of problems with drugs in the past; at one  time attempted suicide because of this.  She is now followed by the mental  health physicians in [place] in [facility].   PHYSICAL EXAMINATION:  VITAL SIGNS:  The patient is 5 feet 4 inches tall,  weighs 325 pounds, alert and oriented x3.  She is afebrile, pulse is 88,  respirations 12, blood pressure 140/110.  HEENT:  Within normal limits except for absence of multiple teeth.  There is  erythema around the tonsillar  pillars, otherwise negative.  NECK:  Supple.  No thyromegaly or masses palpated.  LUNGS:  Clear to A&P.  HEART:  Regular rhythm without murmur; no cardiomegaly noted.  ABDOMEN:  Abdomen is protuberant, obese, soft and nontender.  No  organomegaly or masses palpated.  Hyperactive bowel sounds are auscultated.  EXTREMITIES:  Left hand has positive Tinel's and Phalen's sign.  She had  full range of motion of the fingers and wrist.  Neurovascular is intact.  Examination of the right hand revealed well-healed surgical incision across  the volar surface of the right wrist.  She had full range of motion of the  fingers and hands. There was slight tenderness around the incision and  swelling in the thenar area of the right palm; otherwise was negative.  Lower extremities neurovascularly intact within normal limits.  NEUROLOGIC:  CNS was intact.  SKIN:  Intact.   IMPRESSION:  1. Carpal tunnel syndrome left hand.  2. Hypertension.  3. History of irregular heart beat.  4. Depression.  5. History of suicidal attempt and drug abuse.   PLAN:  The patient will be admitted to [facility] on Aug 02, 2003 to  undergo  carpal tunnel release of the left hand.  Labs are pending.     _____________________________________  ___________________________________________  Candace Cruise, Doylene Bode, M.D.   BB/MEDQ  D:  08/01/2003  T:  08/01/2003  Job:  [id number]

## 2010-08-17 NOTE — Op Note (Signed)
NAME:  Karla Anderson, Karla Anderson                       ACCOUNT NO.:  [id number]   MEDICAL RECORD NO.:  [id number]                   PATIENT TYPE:  AMB   LOCATION:  DAY                                  FACILITY:  [facility]   PHYSICIAN:  J. Darreld Mclean, M.D.              DATE OF BIRTH:  June 05, 1970   DATE OF PROCEDURE:  DATE OF DISCHARGE:                                 OPERATIVE REPORT   PREOPERATIVE DIAGNOSIS:  Carpal tunnel syndrome, right.   POSTOPERATIVE DIAGNOSIS:  Carpal tunnel syndrome, right.   PROCEDURE:  1. Release of volar carpal tunnel ligament, saline neurolysis.  2. Aponeurotomy right median nerve.   ANESTHESIA:  Bier block--please refer to anesthesia record for tourniquet  time.   DRAINS:  No drains.   SPLINTS:  Volar plaster splint.   SURGEON:  J. Darreld Mclean, M.D.   INDICATIONS:  This patient is a 40 year old female who had nerve conduction  velocities showing bilateral carpal tunnel.  She says the right hand hurts  more and she would like to have that done.  She has decreased sensation in  of the median nerve distribution.  The risks and imponderables were  discussed preoperatively; and she appeared to understand the procedure and  asked appropriate questions.   DESCRIPTION OF PROCEDURE:  The patient was seen in the anesthesia holding  area and the right hand was identified as the correct site for the carpal  tunnel release.  Marks were placed by me and the patient.  The patient was  taken to the operating room and placed supine on the operating room table  with hand table attached.  Bier block anesthesia was given.  We again  reascertained that the right hand was the correct site.   The patient was prepped and draped in the usual manner.  An outline for  incision was made with careful dissection.  The median nerve was identified  proximally.  A vessel loop placed around the nerve.  Then a groove director  placed in the volar carpal space.  The volar carpal  ligament was then  incised.  The nerve was markedly compressed.  The retinaculum was cut  proximally.  A specimen of volar carpal ligament was sent to pathology.   Saline neurolysis aponeurotomy carried out.  Nerve was inspected, no  apparent injury.  The wound was reapproximated with 3-0 Nylon interrupted  vertical mattress manner.  Sterile dressing applied; bulky dressing applied;  sheet cotton applied.  Sheet cotton cut dorsally.  Volar plaster splint  applied.  Ace bandage applied, loosely.  The patient tolerated the procedure well and went to recovery in good  condition.  Prescription for Vicodin ES given for pain.  I will the patient  in the office in approximately 10 days to 2 weeks; if any difficulty, she is  to contact me through the office hospital beeper system.      ___________________________________________  Teola Bradley, M.D.   JWK/MEDQ  D:  06/30/2003  T:  06/30/2003  Job:  [id number]

## 2010-08-17 NOTE — Discharge Summary (Signed)
   NAME:  Karla Anderson, Karla Anderson                       ACCOUNT NO.:  [id number]   MEDICAL RECORD NO.:  [id number]                   PATIENT TYPE:  INP   LOCATION:  A209                                 FACILITY:  [facility]   PHYSICIAN:  Gracelyn Nurse, M.D.              DATE OF BIRTH:  [birth year]   DATE OF ADMISSION:  12/22/2001  DATE OF DISCHARGE:  12/23/2001                                 DISCHARGE SUMMARY   DISCHARGE DIAGNOSES:  1. Sinus tachycardia.     a. Thought to be stress induced.  2. History of heart murmur.  3. Hypertension.  4. Hyperlipidemia.  5. Status post cesarean section x1.   DISCHARGE MEDICATIONS:  1. Atenolol 100 mg p.r.n. not to exceed 2 a day.  2. Xanax 0.25 mg t.i.d. p.r.n.   REASON FOR ADMISSION:  This is a 40 year old white female with known history  of tachycardia.  She has been seen in the past by a cardiologist and worked  up for this.  She is supposed to be on atenolol but ran out of it about a  month ago.  This episode of palpitations and some mild chest pain came on  after an argument with her ex-husband.   HOSPITAL COURSE:  1. SINUS TACHYCARDIA:  Cardiology was consulted and saw her and determined     this to be a sinus tachycardia, not a supraventricular tachycardia.  She     will be placed on atenolol on a p.r.n. basis and perhaps some anxiolytics     also would be helpful.  No further workup is planned at this time.   1. STRESS:  We will prescribed some anxiolytics to help with this.     Recommended that the patient seek counseling also.   1.     MORBID OBESITY:  She was interested in inquiring about bariatric surgery, so     I gave her the name and number of [facility] to further inquire     about this; however, it does seem that her family is more interested in     this than actually the patient.   DISPOSITION:  The patient is discharged in stable condition.  Heart rate is  around 82 at time of discharge.                      Gracelyn Nurse, M.D.    JDJ/MEDQ  D:  12/23/2001  T:  12/23/2001  Job:  [id number]   cc:   Gerrit Friends. Dietrich Pates, M.D. [facility]  [address]  [place]  [place] [address]  Fax: 1

## 2010-08-17 NOTE — Consult Note (Signed)
NAME:  Karla Anderson, Karla Anderson NO.:  [id number]   MEDICAL RECORD NO.:  [id number]          PATIENT TYPE:  INP   LOCATION:  A330                          FACILITY:  [facility]   PHYSICIAN:  Vania Rea, M.D. DATE OF BIRTH:  1970/06/21   DATE OF CONSULTATION:  08/23/2004  DATE OF DISCHARGE:                                   CONSULTATION   REQUESTING PHYSICIAN:  J. Darreld Mclean, M.D.   REASON FOR CONSULTATION:  Morbidly obese young lady with hypertension,  depression.  Admitted for repair of fractured left ankle.   HISTORY OF PRESENT ILLNESS:  This is a 40 year old Caucasian lady who is  status post internal fixation of a left lateral malleolar fracture today.  The patient apparently misstepped while coming out of her home, twisted her  ankle, and sustained multiple injuries around the ankle, including a  fracture.  Currently, the patient's only acute medical problems are  difficulty urinating postoperatively since being placed on a narcotic PCA.   PAST MEDICAL HISTORY:  1.  Depression with anxiety.  2.  Hypertension.  3.  Morbid obesity.  4.  Palpitations associated with anxiety.   PAST SURGICAL HISTORY:  1.  Status post bilateral tubal ligations.  2.  Status post bilateral carpal tunnel repair/release.  3.  History of C-section.   MEDICATIONS:  1.  Naproxen 500 mg twice daily for knee pain.  2.  Vicodin 1 tablet every 4 hours p.r.n. pain.  3.  Zoloft 150 mg daily.  4.  Topamax 150 mg twice daily.  5.  Geodon 60 mg at bedtime.   ALLERGIES:  No known drug allergies.   SOCIAL HISTORY:  Patient denies tobacco, alcohol, or illegal drug use.  She  is a single mother.   FAMILY HISTORY:  Significant for father with coronary artery disease and  stroke.  Mother with CHF.  Sister with bipolar disorder.   REVIEW OF SYSTEMS:  On a 10-point review of systems, only positive symptoms  are episodic headaches, self-treated with Naprosyn.  Dyspnea on exertion  related to  her morbid obesity.  She plans to have gastric bypass surgery  later this year.  Episodic pain in her right knee related to her morbidly  obese condition.   PHYSICAL EXAMINATION:  VITAL SIGNS:  Temperature 98.4, pulse 106,  respirations 20, blood pressure 121/81.  GENERAL:  This is a very pleasant young Caucasian female lying in bed.  HEENT:  Pupils are round and equal.  She is not dehydrated.  NECK:  She has no lymphadenopathy.  She has a thick neck.  Jugular venous  distention is not appreciated.  CHEST:  Clear to auscultation bilaterally.  CARDIOVASCULAR:  She has a regular rhythm with a 1/6 systolic murmur.  ABDOMEN:  Obese, soft and nontender.  EXTREMITIES:  Left lower extremity is in a cast and traction.  The right  lower extremity is without edema.  She has good dorsalis pedis pulse.   ASSESSMENT/PLAN:  1.  History of hypertension:  Apparently controlled on diet.  She does have      a history of Cardizem use but is  not using it currently.  Will monitor      her blood pressures throughout this visit.  2.  Status post repair of fractures and torn ligaments around the left      ankle.  The pain is adequately controlled by a narcotic pump.  3.  Urinary retention related to narcotic use.  Will place a Foley catheter.  4.  Depression without acute exacerbation:  Will continue her home      medications.  5.  Will get her baseline labs to be sure there is no metabolic disturbance      at this time.   We wish to thank Dr. Hilda Lias for allowing [place] to participate in the care of  this lady.      LC/MEDQ  D:  08/23/2004  T:  08/23/2004  Job:  [id number]

## 2010-08-17 NOTE — H&P (Signed)
NAME:  Karla Anderson, KRETSCH             ACCOUNT NO.:  [id number]   MEDICAL RECORD NO.:  [id number]          PATIENT TYPE:  AMB   LOCATION:  DAY                           FACILITY:  [facility]   PHYSICIAN:  J. Darreld Mclean, M.D. DATE OF BIRTH:  Jul 09, 1970   DATE OF ADMISSION:  DATE OF DISCHARGE:  LH                                HISTORY & PHYSICAL   CHIEF COMPLAINT:  Injury to the left ankle.   HISTORY OF PRESENT ILLNESS:  The patient is a 40 year old white female who  is to be admitted to the day hospital tomorrow, after undergoing open  treatment internal fixation of a lateral malleolus fracture and repair of  deltoid ligament.  The patient was initially seen here in our office, on Aug 21, 2004, after she sustained a fracture and an injury to the left ankle on  Aug 18, 2004.  She stated that she was stepping out of the door of her home  that had some new construction work.  She miscalculated the distance from  the step to the door and fell, twisting her left ankle.  In doing so, she  sustained the above mentioned injury.  She was taken by EMS to the [facility] for initial care.  There x-rays showed that she had a  nondisplaced fracture of the lateral malleolus and a deltoid ligament tear.  There were no other fractures or bony abnormalities noted.  The patient was  treated with the means of a posterior splint to the left lower extremity,  crutches, pain medication, and asked to follow up with her orthopedist.  She  denies loss of consciousness, head trauma, or syncope.  There were no other  reported injuries.   PAST MEDICAL HISTORY:  1.  Irregular heart beat.  2.  Hypertension.  3.  Depression/anxiety.   OPERATIONS:  1.  C-section.  2.  Dental surgery.  3.  Bilateral tubal ligation.  4.  Carpal tunnel release, right and left hands, per Dr. Hilda Lias.   MEDICATIONS:  1.  Naprosyn 500 mg b.i.d.  2.  Vicodin ES one tablet every four hours as needed for pain.  3.   Zoloft 150 mg one half tablet daily.  4.  Topamax 150 mg b.i.d.  5.  Geodon 60 mg at bedtime.   ALLERGIES:  None.   FAMILY AND SOCIAL HISTORY:  Her father has a history of coronary artery  disease and stroke.  Her mother has a history of CHF.  A sister is living,  has trouble with bipolar disease.  The patient is single.  She has one  child.  She does not use alcohol or tobacco products and states she has had  trouble with drugs in years past.  She has a GED diploma.   REVIEW OF SYSTEMS:  Positive for shortness of breath on exertion and  depression.  She is [facility] for her  depressive disorder.   PHYSICAL EXAMINATION:  VITAL SIGNS:  The patient is 5 foot 4 inches tall,  weighs 325 pounds.  She is afebrile.  Pulse is  80, respirations 20, blood  pressure 128/88.  GENERAL:  The patient is alert and oriented to person, place, and time.  HEENT:  Within normal limits except the patient has only one central  incisor, otherwise she is edentulous.  NECK:  Supple.  No thyromegaly or masses palpated.  LUNGS:  Clear to A&P.  HEART:  Regular rhythm.  ABDOMEN:  No organomegaly or masses palpated.  Normal bowel sounds are  auscultated.  There is no pain, tenderness on palpation.  EXTREMITIES:  Left lower extremity is in a posterior splint.  She has  swelling into the dorsum of the toes.  She had good capillary refill.  Neurovascular appears to be intact to that extremity.  Other extremities  within normal limits, neurovascularly intact exam.  CNS:  Intact.   IMPRESSION:  1.  Nondisplaced lateral malleolus fracture with ruptured deltoid ligament.  2.  History of depression.  3.  Hypertension.  4.  Irregular heart rhythm.   PLAN:  1.  The patient is to be admitted to the day hospital on Aug 23, 2004,      following open treatment internal fixation of the lateral malleolus and      repair of deltoid ligament.  2.  Labs are pending.      BC/MEDQ   D:  08/22/2004  T:  08/22/2004  Job:  [id number]

## 2010-10-31 DIAGNOSIS — Z9289 Personal history of other medical treatment: Secondary | ICD-10-CM

## 2010-10-31 HISTORY — DX: Personal history of other medical treatment: Z92.89

## 2010-11-06 ENCOUNTER — Emergency Department (HOSPITAL_COMMUNITY): Payer: Medicaid Other

## 2010-11-06 ENCOUNTER — Inpatient Hospital Stay (HOSPITAL_COMMUNITY)
Admission: EM | Admit: 2010-11-06 | Discharge: 2010-11-11 | DRG: 419 | Disposition: A | Discharge status: Home / Self Care | Payer: Medicaid Other | Attending: General Surgery | Admitting: General Surgery

## 2010-11-06 ENCOUNTER — Encounter: Payer: Self-pay | Admitting: *Deleted

## 2010-11-06 DIAGNOSIS — K819 Cholecystitis, unspecified: Secondary | ICD-10-CM

## 2010-11-06 DIAGNOSIS — F319 Bipolar disorder, unspecified: Secondary | ICD-10-CM | POA: Diagnosis present

## 2010-11-06 DIAGNOSIS — K8000 Calculus of gallbladder with acute cholecystitis without obstruction: Principal | ICD-10-CM | POA: Diagnosis present

## 2010-11-06 HISTORY — DX: Bipolar disorder, unspecified: F31.9

## 2010-11-06 LAB — DIFFERENTIAL
Basophils Absolute: 0 10*3/uL (ref 0.0–0.1)
Basophils Relative: 1 % (ref 0–1)
Eosinophils Absolute: 0.1 10*3/uL (ref 0.0–0.7)
Eosinophils Relative: 1 % (ref 0–5)
Lymphocytes Relative: 31 % (ref 12–46)
Monocytes Absolute: 0.8 10*3/uL (ref 0.1–1.0)

## 2010-11-06 LAB — URINALYSIS, ROUTINE W REFLEX MICROSCOPIC
Glucose, UA: NEGATIVE mg/dL
Leukocytes, UA: NEGATIVE
Specific Gravity, Urine: 1.02 (ref 1.005–1.030)
Urobilinogen, UA: 0.2 mg/dL (ref 0.0–1.0)

## 2010-11-06 LAB — CBC
HCT: 34.6 % — ABNORMAL LOW (ref 36.0–46.0)
MCH: 28.2 pg (ref 26.0–34.0)
MCHC: 32.1 g/dL (ref 30.0–36.0)
MCV: 88 fL (ref 78.0–100.0)
Platelets: 244 10*3/uL (ref 150–400)
RDW: 15.3 % (ref 11.5–15.5)

## 2010-11-06 LAB — COMPREHENSIVE METABOLIC PANEL
AST: 17 U/L (ref 0–37)
CO2: 23 mEq/L (ref 19–32)
Calcium: 8.7 mg/dL (ref 8.4–10.5)
Creatinine, Ser: 1.13 mg/dL — ABNORMAL HIGH (ref 0.50–1.10)
GFR calc non Af Amer: 54 mL/min — ABNORMAL LOW (ref >60)
Sodium: 136 mEq/L (ref 135–145)
Total Protein: 6 g/dL (ref 6.0–8.3)

## 2010-11-06 LAB — POCT PREGNANCY, URINE: Preg Test, Ur: NEGATIVE

## 2010-11-06 MED ORDER — PROMETHAZINE HCL 25 MG/ML IJ SOLN
12.5000 mg | Freq: Four times a day (QID) | INTRAMUSCULAR | Status: DC | PRN
  Administered 2010-11-08 – 2010-11-09 (×3): 12.5 mg via INTRAVENOUS
  Filled 2010-11-06 (×3): qty 1

## 2010-11-06 MED ORDER — ENOXAPARIN SODIUM 80 MG/0.8ML ~~LOC~~ SOLN
40.0000 mg | SUBCUTANEOUS | Status: DC
  Administered 2010-11-06 – 2010-11-10 (×4): 40 mg via SUBCUTANEOUS
  Filled 2010-11-06 (×4): qty 0.8

## 2010-11-06 MED ORDER — ONDANSETRON HCL 4 MG/2ML IJ SOLN
4.0000 mg | Freq: Once | INTRAMUSCULAR | Status: AC
  Administered 2010-11-06: 4 mg via INTRAVENOUS
  Filled 2010-11-06: qty 2

## 2010-11-06 MED ORDER — MORPHINE SULFATE 4 MG/ML IJ SOLN
4.0000 mg | Freq: Once | INTRAMUSCULAR | Status: AC
  Administered 2010-11-06: 4 mg via INTRAVENOUS
  Filled 2010-11-06: qty 1

## 2010-11-06 MED ORDER — HYDROMORPHONE HCL 1 MG/ML IJ SOLN
INTRAMUSCULAR | Status: AC
  Administered 2010-11-06: 1 mg via INTRAVENOUS
  Filled 2010-11-06: qty 1

## 2010-11-06 MED ORDER — ENOXAPARIN SODIUM 80 MG/0.8ML ~~LOC~~ SOLN
SUBCUTANEOUS | Status: AC
  Filled 2010-11-06: qty 0.8

## 2010-11-06 MED ORDER — LACTATED RINGERS IV SOLN
INTRAVENOUS | Status: DC
  Administered 2010-11-06 – 2010-11-08 (×2): via INTRAVENOUS
  Administered 2010-11-08 (×2): 100 mL/h via INTRAVENOUS
  Administered 2010-11-09: 07:00:00 via INTRAVENOUS

## 2010-11-06 MED ORDER — HYDROMORPHONE HCL 1 MG/ML IJ SOLN
1.0000 mg | INTRAMUSCULAR | Status: DC | PRN
  Administered 2010-11-06 – 2010-11-09 (×12): 1 mg via INTRAVENOUS
  Filled 2010-11-06 (×12): qty 1

## 2010-11-06 MED ORDER — ONDANSETRON HCL 4 MG/2ML IJ SOLN
4.0000 mg | Freq: Four times a day (QID) | INTRAMUSCULAR | Status: DC | PRN
  Administered 2010-11-06 – 2010-11-10 (×9): 4 mg via INTRAVENOUS
  Filled 2010-11-06 (×9): qty 2

## 2010-11-06 MED ORDER — MORPHINE SULFATE 4 MG/ML IJ SOLN: 4.0000 mg | Freq: Once | INTRAMUSCULAR | Status: DC

## 2010-11-06 NOTE — ED Notes (Signed)
Upper abd pain with n/v and constipation intermittently x 2 months.   Denies diarrhea.  Eating/drinking  Normally.

## 2010-11-06 NOTE — ED Notes (Signed)
Patient left ED in good condition. NAD noted. Left ED via wheelchair with ED tech.

## 2010-11-06 NOTE — ED Provider Notes (Signed)
History   Chart scribed for Joya Gaskins, MD by Enos Fling; the patient was seen in room APA07/APA07; this patient's care was started at 1:02 PM.    CSN: [id number] Arrival date & time: 11/06/2010 12:08 PM  Chief Complaint  Patient presents with  . Abdominal Pain   HPI Karla Anderson is a 40 y.o. female who presents to the Emergency Department complaining of abd pain. Pt reports upper abd pain, worse on the right, with associated nausea and vomiting has been persistent for several months. Pain is intermittent and non-radiating. Pt has been seen at the health department several times, last seen last week; no radiology but has checked blood work, low potassium found. No dysuria, f/c, hematuria, or vaginal bleeding/discharge. Pt states she used to weigh 365 pounds, and her aunt told her she was going to die, so she "stopped eating and lost 200lb in 2 months." Pt reports h/o c-section and tubal ligation and reversal many years ago, no other abd surgeries.  PAST MEDICAL HISTORY:  Past Medical History  Diagnosis Date  . Tachycardia   . Anxiety   . Depression   . Heart murmur   . Back pain   . Bipolar 1 disorder   . Arthritis     bil knee    PAST SURGICAL HISTORY:  Past Surgical History  Procedure Date  . Cesarean section   . Carpel tunn Carpel tunnel sx  . Tubal ligation   . Tubal ligation reversal      MEDICATIONS:  Previous Medications   HYDROXYZINE (ATARAX) 50 MG TABLET    Take 50 mg by mouth every 6 (six) hours as needed. For nerves    LUBIPROSTONE (AMITIZA) 24 MCG CAPSULE    Take 24 mcg by mouth daily.     QUETIAPINE (SEROQUEL) 300 MG TABLET    Take 600 mg by mouth at bedtime.     RANITIDINE (ZANTAC) 150 MG TABLET    Take 150 mg by mouth 2 (two) times daily.     SERTRALINE (ZOLOFT) 100 MG TABLET    Take 100 mg by mouth daily.     TRAZODONE (DESYREL) 150 MG TABLET    Take 150 mg by mouth at bedtime.     ZIPRASIDONE (GEODON) 80 MG CAPSULE    Take 160 mg by mouth at  bedtime.     ZOLPIDEM (AMBIEN) 10 MG TABLET    Take 10 mg by mouth at bedtime.       ALLERGIES:  Allergies as of 11/06/2010  . (No Known Allergies)     FAMILY HISTORY:  History reviewed. No pertinent family history.   SOCIAL HISTORY: History   Social History  . Marital Status: Divorced    Spouse Name: N/A    Number of Children: N/A  . Years of Education: N/A   Social History Main Topics  . Smoking status: Never Smoker   . Smokeless tobacco: None  . Alcohol Use: No  . Drug Use: No  . Sexually Active: Yes    Birth Control/ Protection: None   Other Topics Concern  . None   Social History Narrative  . None      Review of Systems 10 Systems reviewed and are negative for acute change except as noted in the HPI.  Physical Exam  BP 145/87  Pulse 116  Temp(Src) 98 F (36.7 C) (Oral)  Resp 20  Ht 5\' 5"  (1.651 m)  Wt 250 lb (113.399 kg)  BMI 41.60 kg/m2  SpO2 99%  LMP 10/01/2010  Physical Exam CONSTITUTIONAL: Well developed/well nourished HEAD AND FACE: Normocephalic/atraumatic EYES: EOMI/PERRL ENMT: Mucous membranes moist NECK: supple no meningeal signs SPINE:entire spine nontender CV: S1/S2 noted, no murmurs/rubs/gallops noted LUNGS: Lungs are clear to auscultation bilaterally, no apparent distress ABDOMEN: soft, no rebound or guarding, moderate RUQ tenderness GU:no cva tenderness NEURO: Pt is awake/alert, moves all extremitiesx4 EXTREMITIES: pulses normal, full ROM SKIN: warm, color normal PSYCH: no abnormalities of mood noted  ED Course  Procedures  OTHER DATA REVIEWED: Nursing notes and vital signs reviewed. All labs/vitals reviewed and considered  LABS / RADIOLOGY:  Results for orders placed during the hospital encounter of 11/06/10  CBC      Component Value Range   WBC 6.2  4.0 - 10.5 (K/uL)   RBC 3.93  3.87 - 5.11 (MIL/uL)   Hemoglobin 11.1 (*) 12.0 - 15.0 (g/dL)   HCT 16.1 (*) 09.6 - 46.0 (%)   MCV 88.0  78.0 - 100.0 (fL)   MCH 28.2   26.0 - 34.0 (pg)   MCHC 32.1  30.0 - 36.0 (g/dL)   RDW 04.5  40.9 - 81.1 (%)   Platelets 244  150 - 400 (K/uL)  DIFFERENTIAL      Component Value Range   Neutrophils Relative 55  43 - 77 (%)   Neutro Abs 3.4  1.7 - 7.7 (K/uL)   Lymphocytes Relative 31  12 - 46 (%)   Lymphs Abs 1.9  0.7 - 4.0 (K/uL)   Monocytes Relative 13 (*) 3 - 12 (%)   Monocytes Absolute 0.8  0.1 - 1.0 (K/uL)   Eosinophils Relative 1  0 - 5 (%)   Eosinophils Absolute 0.1  0.0 - 0.7 (K/uL)   Basophils Relative 1  0 - 1 (%)   Basophils Absolute 0.0  0.0 - 0.1 (K/uL)  COMPREHENSIVE METABOLIC PANEL      Component Value Range   Sodium 136  135 - 145 (mEq/L)   Potassium 3.5  3.5 - 5.1 (mEq/L)   Chloride 101  96 - 112 (mEq/L)   CO2 23  19 - 32 (mEq/L)   Glucose, Bld 91  70 - 99 (mg/dL)   BUN 7  6 - 23 (mg/dL)   Creatinine, Ser 9.14 (*) 0.50 - 1.10 (mg/dL)   Calcium 8.7  8.4 - 78.2 (mg/dL)   Total Protein 6.0  6.0 - 8.3 (g/dL)   Albumin 2.6 (*) 3.5 - 5.2 (g/dL)   AST 17  0 - 37 (U/L)   ALT 16  0 - 35 (U/L)   Alkaline Phosphatase 74  39 - 117 (U/L)   Total Bilirubin 0.4  0.3 - 1.2 (mg/dL)   GFR calc non Af Amer 54 (*) >60 (mL/min)   GFR calc Af Amer >60  >60 (mL/min)  LIPASE, BLOOD      Component Value Range   Lipase 149 (*) 11 - 59 (U/L)  URINALYSIS, ROUTINE W REFLEX MICROSCOPIC      Component Value Range   Color, Urine YELLOW  YELLOW    Appearance CLEAR  CLEAR    Specific Gravity, Urine 1.020  1.005 - 1.030    pH 7.5  5.0 - 8.0    Glucose, UA NEGATIVE  NEGATIVE (mg/dL)   Hgb urine dipstick NEGATIVE  NEGATIVE    Bilirubin Urine NEGATIVE  NEGATIVE    Ketones, ur NEGATIVE  NEGATIVE (mg/dL)   Protein, ur TRACE (*) NEGATIVE (mg/dL)   Urobilinogen, UA 0.2  0.0 - 1.0 (mg/dL)  Nitrite NEGATIVE  NEGATIVE    Leukocytes, UA NEGATIVE  NEGATIVE   POCT PREGNANCY, URINE      Component Value Range   Preg Test, Ur NEGATIVE    URINE MICROSCOPIC-ADD ON      Component Value Range   Squamous Epithelial / LPF MANY (*)  RARE    WBC, UA 0-2  <3 (WBC/hpf)   Bacteria, UA FEW (*) RARE    US Abdomen Complete  11/06/2010  *RADIOLOGY REPORT*  Clinical Data:  Right upper quadrant abdominal pain  ABDOMINAL ULTRASOUND COMPLETE  Comparison:  No similar prior study is available for comparison.  Findings:  Gallbladder:  The gallbladder is filled with stones.  The patient reported tenderness with examination of the gallbladder. Gallbladder wall thickness measures 4 mm.  No pericholecystic fluid identified.  Common Bile Duct:  Within normal limits in caliber.  Liver: No focal mass lesion identified.  Within normal limits in parenchymal echogenicity.  IVC:  Appears normal.  Pancreas:  Obscured by bowel gas  Spleen:  Within normal limits in size and echotexture.  Right kidney:  Normal in size and parenchymal echogenicity.  No evidence of mass or hydronephrosis.  Left kidney:  Normal in size and parenchymal echogenicity.  No evidence of mass or hydronephrosis.  Abdominal Aorta:  No aneurysm identified.  IMPRESSION: Gallbladder filled with stones, mild gallbladder wall thickening, and positive sonographic Murphy's sign.  Constellation of findings is highly suspicious for acute cholecystitis.  Original Report Authenticated By: Harrel Lemon, M.D.       MDM: D/w dr ziegler will admit He does not abx at this time Pt agreeable   IMPRESSION: cholecystitis    PLAN:  Admission  Pain improved; pt agrees to plan   MEDICATIONS GIVEN IN THE E.D.  morphine injection 4 mg (4 mg Intravenous Given 11/06/10 1359)  ondansetron (ZOFRAN) injection 4 mg (4 mg Intravenous Given 11/06/10 1356)     DISCHARGE MEDICATIONS: New Prescriptions   No medications on file    I personally performed the services described in this documentation, which was scribed in my presence. The recorded information has been reviewed and considered. Joya Gaskins, MD      Joya Gaskins, MD 11/06/10 [phone]

## 2010-11-06 NOTE — ED Notes (Signed)
Report called to Lupita Leash on [address]. Ready to accept patient.

## 2010-11-07 LAB — BASIC METABOLIC PANEL
BUN: 6 mg/dL (ref 6–23)
CO2: 24 mEq/L (ref 19–32)
Chloride: 105 mEq/L (ref 96–112)
Creatinine, Ser: 1.13 mg/dL — ABNORMAL HIGH (ref 0.50–1.10)
Potassium: 3.5 mEq/L (ref 3.5–5.1)

## 2010-11-07 LAB — CBC
HCT: 30.5 % — ABNORMAL LOW (ref 36.0–46.0)
Hemoglobin: 9.4 g/dL — ABNORMAL LOW (ref 12.0–15.0)
MCHC: 30.8 g/dL (ref 30.0–36.0)
MCV: 90.5 fL (ref 78.0–100.0)
RDW: 15.7 % — ABNORMAL HIGH (ref 11.5–15.5)

## 2010-11-07 MED ORDER — CHLORHEXIDINE GLUCONATE CLOTH 2 % EX PADS
6.0000 | MEDICATED_PAD | Freq: Every day | CUTANEOUS | Status: DC
  Administered 2010-11-08 – 2010-11-11 (×3): 6 via TOPICAL
  Filled 2010-11-07: qty 6

## 2010-11-07 MED ORDER — MUPIROCIN 2 % EX OINT
TOPICAL_OINTMENT | Freq: Two times a day (BID) | CUTANEOUS | Status: DC
  Administered 2010-11-07 – 2010-11-09 (×5): via NASAL
  Administered 2010-11-10: 1 via NASAL
  Administered 2010-11-10: 11:00:00 via NASAL
  Administered 2010-11-11: 1 via NASAL
  Filled 2010-11-07 (×2): qty 22

## 2010-11-07 NOTE — H&P (Signed)
Karla Anderson is an 40 y.o. female.   Chief Complaint: Right upper quadrant abdominal pain HPI: Patient is a 40 year old female who presented to [facility] with 2 month history of abdominal pain. Pain has been intermittent. It is exacerbated with by mouth intake. She denies any increased with fatty greasy foods. She does have associated nausea and occasional emesis. This is the nonbloody. She has not had any fever or chills. She has not noted similar symptomatology in the past. She has not had any episodes of jaundice. Pain is constant when it occurs in the right upper quadrant. Somewhat colicky in nature and description. No significant radiation. No significant relieving factors. She's not had any significant change in bowel movements. No melena, no hematochezia, no diarrhea, no constipation. She does have significant family history of biliary disease. She does have Native American ancestry.  Past Medical History  Diagnosis Date  . Tachycardia   . Anxiety   . Depression   . Heart murmur   . Back pain   . Bipolar 1 disorder   . Arthritis     bil knee    Past Surgical History  Procedure Date  . Cesarean section   . Carpel tunn Carpel tunnel sx  . Tubal ligation   . Tubal ligation reversal     History reviewed. No pertinent family history. Social History:  reports that she has never smoked. She does not have any smokeless tobacco history on file. She reports that she does not drink alcohol or use illicit drugs.  Allergies: No Known Allergies  Medications Prior to Admission  Medication Dose Route Frequency Provider Last Rate Last Dose  . Chlorhexidine Gluconate Cloth 2 % PADS 6 each  6 each Topical Q0600 Fabio Bering      . enoxaparin (LOVENOX) 80 MG/0.8ML injection           . enoxaparin (LOVENOX) injection 40 mg  40 mg Subcutaneous Q24H Braydn Carneiro C Jermario Kalmar   40 mg at 11/06/10 1623  . HYDROmorphone (DILAUDID) injection 1-2 mg  1-2 mg Intravenous Q4H PRN Fabio Bering   1  mg at 11/07/10 1134  . lactated ringers infusion   Intravenous Continuous Fabio Bering 100 mL/hr at 11/06/10 2200    . morphine injection 4 mg  4 mg Intravenous Once Joya Gaskins, MD   4 mg at 11/06/10 1359  . morphine injection 4 mg  4 mg Intravenous Once Joya Gaskins, MD      . mupirocin [facility]) 2 % ointment   Nasal BID Fabio Bering      . ondansetron (ZOFRAN) injection 4 mg  4 mg Intravenous Once Joya Gaskins, MD   4 mg at 11/06/10 1356  . ondansetron (ZOFRAN) injection 4 mg  4 mg Intravenous Once Joya Gaskins, MD   4 mg at 11/06/10 1549  . ondansetron (ZOFRAN) injection 4 mg  4 mg Intravenous Q6H PRN Fabio Bering   4 mg at 11/07/10 0946  . promethazine (PHENERGAN) injection 12.5 mg  12.5 mg Intravenous Q6H PRN Fabio Bering       No current outpatient prescriptions on file as of 11/07/2010.    Results for orders placed during the hospital encounter of 11/06/10 (from the past 48 hour(s))  URINALYSIS, ROUTINE W REFLEX MICROSCOPIC     Status: Abnormal   Collection Time   11/06/10  1:22 PM      Component Value Range Comment   Color, Urine YELLOW  YELLOW     Appearance CLEAR  CLEAR     Specific Gravity, Urine 1.020  1.005 - 1.030     pH 7.5  5.0 - 8.0     Glucose, UA NEGATIVE  NEGATIVE (mg/dL)    Hgb urine dipstick NEGATIVE  NEGATIVE     Bilirubin Urine NEGATIVE  NEGATIVE     Ketones, ur NEGATIVE  NEGATIVE (mg/dL)    Protein, ur TRACE (*) NEGATIVE (mg/dL)    Urobilinogen, UA 0.2  0.0 - 1.0 (mg/dL)    Nitrite NEGATIVE  NEGATIVE     Leukocytes, UA NEGATIVE  NEGATIVE    URINE MICROSCOPIC-ADD ON     Status: Abnormal   Collection Time   11/06/10  1:22 PM      Component Value Range Comment   Squamous Epithelial / LPF MANY (*) RARE     WBC, UA 0-2  <3 (WBC/hpf)    Bacteria, UA FEW (*) RARE    POCT PREGNANCY, URINE     Status: Normal   Collection Time   11/06/10  1:23 PM      Component Value Range Comment   Preg Test, Ur NEGATIVE     CBC     Status:  Abnormal   Collection Time   11/06/10  1:33 PM      Component Value Range Comment   WBC 6.2  4.0 - 10.5 (K/uL)    RBC 3.93  3.87 - 5.11 (MIL/uL)    Hemoglobin 11.1 (*) 12.0 - 15.0 (g/dL)    HCT 96.0 (*) 45.4 - 46.0 (%)    MCV 88.0  78.0 - 100.0 (fL)    MCH 28.2  26.0 - 34.0 (pg)    MCHC 32.1  30.0 - 36.0 (g/dL)    RDW 09.8  11.9 - 14.7 (%)    Platelets 244  150 - 400 (K/uL)   DIFFERENTIAL     Status: Abnormal   Collection Time   11/06/10  1:33 PM      Component Value Range Comment   Neutrophils Relative 55  43 - 77 (%)    Neutro Abs 3.4  1.7 - 7.7 (K/uL)    Lymphocytes Relative 31  12 - 46 (%)    Lymphs Abs 1.9  0.7 - 4.0 (K/uL)    Monocytes Relative 13 (*) 3 - 12 (%)    Monocytes Absolute 0.8  0.1 - 1.0 (K/uL)    Eosinophils Relative 1  0 - 5 (%)    Eosinophils Absolute 0.1  0.0 - 0.7 (K/uL)    Basophils Relative 1  0 - 1 (%)    Basophils Absolute 0.0  0.0 - 0.1 (K/uL)   COMPREHENSIVE METABOLIC PANEL     Status: Abnormal   Collection Time   11/06/10  1:33 PM      Component Value Range Comment   Sodium 136  135 - 145 (mEq/L)    Potassium 3.5  3.5 - 5.1 (mEq/L)    Chloride 101  96 - 112 (mEq/L)    CO2 23  19 - 32 (mEq/L)    Glucose, Bld 91  70 - 99 (mg/dL)    BUN 7  6 - 23 (mg/dL)    Creatinine, Ser 8.29 (*) 0.50 - 1.10 (mg/dL)    Calcium 8.7  8.4 - 10.5 (mg/dL)    Total Protein 6.0  6.0 - 8.3 (g/dL)    Albumin 2.6 (*) 3.5 - 5.2 (g/dL)    AST 17  0 - 37 (U/L)  ALT 16  0 - 35 (U/L)    Alkaline Phosphatase 74  39 - 117 (U/L)    Total Bilirubin 0.4  0.3 - 1.2 (mg/dL)    GFR calc non Af Amer 54 (*) >60 (mL/min)    GFR calc Af Amer >60  >60 (mL/min)   LIPASE, BLOOD     Status: Abnormal   Collection Time   11/06/10  1:33 PM      Component Value Range Comment   Lipase 149 (*) 11 - 59 (U/L)   MRSA PCR SCREENING     Status: Abnormal   Collection Time   11/06/10  7:34 PM      Component Value Range Comment   MRSA by PCR POSITIVE (*) NEGATIVE    BASIC METABOLIC PANEL     Status:  Abnormal   Collection Time   11/07/10  4:35 AM      Component Value Range Comment   Sodium 139  135 - 145 (mEq/L)    Potassium 3.5  3.5 - 5.1 (mEq/L)    Chloride 105  96 - 112 (mEq/L)    CO2 24  19 - 32 (mEq/L)    Glucose, Bld 77  70 - 99 (mg/dL)    BUN 6  6 - 23 (mg/dL)    Creatinine, Ser 1.61 (*) 0.50 - 1.10 (mg/dL)    Calcium 8.2 (*) 8.4 - 10.5 (mg/dL)    GFR calc non Af Amer 54 (*) >60 (mL/min)    GFR calc Af Amer >60  >60 (mL/min)   CBC     Status: Abnormal   Collection Time   11/07/10  4:35 AM      Component Value Range Comment   WBC 5.4  4.0 - 10.5 (K/uL)    RBC 3.37 (*) 3.87 - 5.11 (MIL/uL)    Hemoglobin 9.4 (*) 12.0 - 15.0 (g/dL)    HCT 09.6 (*) 04.5 - 46.0 (%)    MCV 90.5  78.0 - 100.0 (fL)    MCH 27.9  26.0 - 34.0 (pg)    MCHC 30.8  30.0 - 36.0 (g/dL)    RDW 40.9 (*) 81.1 - 15.5 (%)    Platelets 206  150 - 400 (K/uL)    US Abdomen Complete  11/06/2010  *RADIOLOGY REPORT*  Clinical Data:  Right upper quadrant abdominal pain  ABDOMINAL ULTRASOUND COMPLETE  Comparison:  No similar prior study is available for comparison.  Findings:  Gallbladder:  The gallbladder is filled with stones.  The patient reported tenderness with examination of the gallbladder. Gallbladder wall thickness measures 4 mm.  No pericholecystic fluid identified.  Common Bile Duct:  Within normal limits in caliber.  Liver: No focal mass lesion identified.  Within normal limits in parenchymal echogenicity.  IVC:  Appears normal.  Pancreas:  Obscured by bowel gas  Spleen:  Within normal limits in size and echotexture.  Right kidney:  Normal in size and parenchymal echogenicity.  No evidence of mass or hydronephrosis.  Left kidney:  Normal in size and parenchymal echogenicity.  No evidence of mass or hydronephrosis.  Abdominal Aorta:  No aneurysm identified.  IMPRESSION: Gallbladder filled with stones, mild gallbladder wall thickening, and positive sonographic Murphy's sign.  Constellation of findings is highly  suspicious for acute cholecystitis.  Original Report Authenticated By: Harrel Lemon, M.D.    Review of Systems  Constitutional: Positive for weight loss. Negative for fever, chills, malaise/fatigue and diaphoresis.  HENT: Negative.   Eyes: Negative.   Respiratory: Negative.  Cardiovascular: Negative.   Gastrointestinal: Positive for nausea, vomiting and abdominal pain (right upper quadrant). Negative for heartburn, diarrhea, constipation, blood in stool and melena.  Genitourinary: Negative.   Musculoskeletal: Negative.   Skin: Negative.   Neurological: Negative.  Negative for weakness.  Endo/Heme/Allergies: Negative.   Psychiatric/Behavioral: Negative.     Blood pressure 138/81, pulse 101, temperature 98 F (36.7 C), temperature source Oral, resp. rate 18, height 5\' 5"  (1.651 m), weight 113.399 kg (250 lb), last menstrual period 10/01/2010, SpO2 93.00%. Physical Exam  Constitutional: She is oriented to person, place, and time. She appears well-developed and well-nourished. No distress.       Obese  HENT:  Head: Normocephalic and atraumatic.  Eyes: Conjunctivae and EOM are normal. Pupils are equal, round, and reactive to light. No scleral icterus.  Neck: Normal range of motion. Neck supple. No tracheal deviation present. No thyromegaly present.  Cardiovascular: Normal rate, regular rhythm, normal heart sounds and intact distal pulses.   Respiratory: Breath sounds normal. No respiratory distress. She has no wheezes.  GI: Soft. Bowel sounds are normal. She exhibits no distension and no mass. There is tenderness (moderate right upper quadrant abdominal pain, positive Murphy sign.). There is no rebound and no guarding.  Musculoskeletal: Normal range of motion.  Lymphadenopathy:    She has no cervical adenopathy.  Neurological: She is alert and oriented to person, place, and time.  Skin: Skin is warm and dry.     Assessment/Plan Acute cholecystitis. At this point patient will be  admitted, continued on IV fluid hydration, continued on bowel rest, continued on DVT prophylaxis, and pain medication. Risks benefits alternatives of possible laparoscopic possible open cholecystectomy were discussed. At this point we will plan to proceed prior to discharge. At this point we'll continue attempts to hydrate patient and allow the inflammatory process to decrease. Patient understands as does her family. Patient will be admitted as an inpatient.  Briggett Tuccillo C 11/07/2010, 2:23 PM

## 2010-11-08 LAB — CBC
HCT: 33.9 % — ABNORMAL LOW (ref 36.0–46.0)
MCH: 28.7 pg (ref 26.0–34.0)
MCHC: 31.3 g/dL (ref 30.0–36.0)
MCV: 91.9 fL (ref 78.0–100.0)
Platelets: 211 10*3/uL (ref 150–400)
RDW: 15.3 % (ref 11.5–15.5)

## 2010-11-08 LAB — BASIC METABOLIC PANEL
BUN: 6 mg/dL (ref 6–23)
CO2: 24 mEq/L (ref 19–32)
Calcium: 8 mg/dL — ABNORMAL LOW (ref 8.4–10.5)
Chloride: 102 mEq/L (ref 96–112)
Creatinine, Ser: 0.96 mg/dL (ref 0.50–1.10)
Glucose, Bld: 83 mg/dL (ref 70–99)

## 2010-11-08 MED ORDER — SODIUM CHLORIDE 0.9 % IJ SOLN
INTRAMUSCULAR | Status: AC
  Administered 2010-11-08: 08:00:00
  Filled 2010-11-08: qty 10

## 2010-11-08 MED ORDER — CEFAZOLIN SODIUM-DEXTROSE 2-3 GM-% IV SOLR
2.0000 g | INTRAVENOUS | Status: AC
  Administered 2010-11-09: 2 g via INTRAVENOUS
  Filled 2010-11-08: qty 50

## 2010-11-08 MED ORDER — CEFAZOLIN SODIUM 1-5 GM-% IV SOLN
INTRAVENOUS | Status: AC
  Filled 2010-11-08: qty 50

## 2010-11-08 MED ORDER — SODIUM CHLORIDE 0.9 % IJ SOLN
INTRAMUSCULAR | Status: AC
  Filled 2010-11-08: qty 10

## 2010-11-08 NOTE — Progress Notes (Signed)
  Subjective: Some nausea.  No increased pain.  Still sore.  Objective: Vital signs in last 24 hours: Temp:  [97.6 F (36.4 C)-98.4 F (36.9 C)] 97.9 F (36.6 C) (08/09 0445) Pulse Rate:  [98-101] 101  (08/09 0445) Resp:  [18-20] 18  (08/09 0445) BP: (128-149)/(72-96) 143/96 mmHg (08/09 0445) SpO2:  [95 %-97 %] 96 % (08/09 0445) Last BM Date: 11/05/10  Intake/Output from previous day: 08/08 0701 - 08/09 0700 In: 3200 [I.V.:3200] Out: -  Intake/Output this shift:    General appearance: alert and no distress GI: soft obese, mod RUQ tenderness.  No peritoneal signs.  Lab Results:  @LABLAST2 (wbc:2,hgb:2,hct:2,plt:2) BMET  Basename 11/08/10 0525 11/07/10 0435  NA 136 139  K 3.9 3.5  CL 102 105  CO2 24 24  GLUCOSE 83 77  BUN 6 6  CREATININE 0.96 1.13*  CALCIUM 8.0* 8.2*   PT/INR No results found for this basename: LABPROT:2,INR:2 in the last 72 hours ABG No results found for this basename: PHART:2,PCO2:2,PO2:2,HCO3:2 in the last 72 hours  Studies/Results: US Abdomen Complete  11/06/2010  *RADIOLOGY REPORT*  Clinical Data:  Right upper quadrant abdominal pain  ABDOMINAL ULTRASOUND COMPLETE  Comparison:  No similar prior study is available for comparison.  Findings:  Gallbladder:  The gallbladder is filled with stones.  The patient reported tenderness with examination of the gallbladder. Gallbladder wall thickness measures 4 mm.  No pericholecystic fluid identified.  Common Bile Duct:  Within normal limits in caliber.  Liver: No focal mass lesion identified.  Within normal limits in parenchymal echogenicity.  IVC:  Appears normal.  Pancreas:  Obscured by bowel gas  Spleen:  Within normal limits in size and echotexture.  Right kidney:  Normal in size and parenchymal echogenicity.  No evidence of mass or hydronephrosis.  Left kidney:  Normal in size and parenchymal echogenicity.  No evidence of mass or hydronephrosis.  Abdominal Aorta:  No aneurysm identified.  IMPRESSION:  Gallbladder filled with stones, mild gallbladder wall thickening, and positive sonographic Murphy's sign.  Constellation of findings is highly suspicious for acute cholecystitis.  Original Report Authenticated By: Harrel Lemon, M.D.    Anti-infectives: Anti-infectives    None      Assessment/Plan: s/p Procedure(s): LAPAROSCOPIC CHOLECYSTECTOMY Acute chole.  To OR in Am as discussed.  NPO after MN.  LOS: 2 days    Idabell Picking C 11/08/2010

## 2010-11-09 ENCOUNTER — Encounter (HOSPITAL_COMMUNITY)
Admission: EM | Disposition: A | Discharge status: Home / Self Care | Payer: Self-pay | Source: Home / Self Care | Attending: General Surgery

## 2010-11-09 ENCOUNTER — Inpatient Hospital Stay (HOSPITAL_COMMUNITY): Payer: Medicaid Other | Admitting: Anesthesiology

## 2010-11-09 ENCOUNTER — Encounter (HOSPITAL_COMMUNITY): Payer: Self-pay | Admitting: Anesthesiology

## 2010-11-09 ENCOUNTER — Encounter (HOSPITAL_COMMUNITY): Payer: Self-pay

## 2010-11-09 ENCOUNTER — Other Ambulatory Visit: Payer: Self-pay | Admitting: General Surgery

## 2010-11-09 HISTORY — PX: CHOLECYSTECTOMY: SHX55

## 2010-11-09 LAB — DIFFERENTIAL
Basophils Absolute: 0 10*3/uL (ref 0.0–0.1)
Basophils Relative: 0 % (ref 0–1)
Eosinophils Absolute: 0.3 10*3/uL (ref 0.0–0.7)
Monocytes Relative: 11 % (ref 3–12)
Neutro Abs: 4.7 10*3/uL (ref 1.7–7.7)
Neutrophils Relative %: 55 % (ref 43–77)

## 2010-11-09 LAB — CBC
MCH: 28.8 pg (ref 26.0–34.0)
MCHC: 31.1 g/dL (ref 30.0–36.0)
RDW: 15.1 % (ref 11.5–15.5)

## 2010-11-09 LAB — COMPREHENSIVE METABOLIC PANEL
AST: 21 U/L (ref 0–37)
Albumin: 2.5 g/dL — ABNORMAL LOW (ref 3.5–5.2)
BUN: 6 mg/dL (ref 6–23)
Chloride: 100 mEq/L (ref 96–112)
Creatinine, Ser: 1.03 mg/dL (ref 0.50–1.10)
Potassium: 4 mEq/L (ref 3.5–5.1)
Total Bilirubin: 0.4 mg/dL (ref 0.3–1.2)
Total Protein: 6.1 g/dL (ref 6.0–8.3)

## 2010-11-09 SURGERY — LAPAROSCOPIC CHOLECYSTECTOMY
Anesthesia: General | Site: Abdomen | Wound class: Contaminated

## 2010-11-09 MED ORDER — ONDANSETRON HCL 4 MG/2ML IJ SOLN: 4.0000 mg | Freq: Once | INTRAMUSCULAR | Status: DC

## 2010-11-09 MED ORDER — ACETAMINOPHEN 325 MG PO TABS: 325.0000 mg | ORAL_TABLET | ORAL | Status: DC | PRN

## 2010-11-09 MED ORDER — BUPIVACAINE HCL (PF) 0.5 % IJ SOLN
INTRAMUSCULAR | Status: AC
  Filled 2010-11-09: qty 30

## 2010-11-09 MED ORDER — LACTATED RINGERS IV SOLN: INTRAVENOUS | Status: DC

## 2010-11-09 MED ORDER — ONDANSETRON HCL 4 MG/2ML IJ SOLN: 4.0000 mg | Freq: Once | INTRAMUSCULAR | Status: AC | PRN

## 2010-11-09 MED ORDER — OXIDIZED CELLULOSE EX PADS
MEDICATED_PAD | CUTANEOUS | Status: DC | PRN
  Administered 2010-11-09: 1 via CUTANEOUS

## 2010-11-09 MED ORDER — GLYCOPYRROLATE 0.2 MG/ML IJ SOLN
INTRAMUSCULAR | Status: AC
  Filled 2010-11-09: qty 1

## 2010-11-09 MED ORDER — ZOLPIDEM TARTRATE 5 MG PO TABS
10.0000 mg | ORAL_TABLET | Freq: Every day | ORAL | Status: DC
  Administered 2010-11-09 – 2010-11-10 (×2): 10 mg via ORAL
  Filled 2010-11-09: qty 2
  Filled 2010-11-09: qty 1
  Filled 2010-11-09: qty 2

## 2010-11-09 MED ORDER — MIDAZOLAM HCL 2 MG/2ML IJ SOLN
1.0000 mg | INTRAMUSCULAR | Status: DC | PRN
  Administered 2010-11-09: 2 mg via INTRAVENOUS

## 2010-11-09 MED ORDER — NEOSTIGMINE METHYLSULFATE 1 MG/ML IJ SOLN
INTRAMUSCULAR | Status: DC | PRN
  Administered 2010-11-09: 2 mg via INTRAMUSCULAR

## 2010-11-09 MED ORDER — FENTANYL CITRATE 0.05 MG/ML IJ SOLN
INTRAMUSCULAR | Status: AC
  Administered 2010-11-09: 50 ug via INTRAVENOUS
  Filled 2010-11-09: qty 2

## 2010-11-09 MED ORDER — LUBIPROSTONE 24 MCG PO CAPS
24.0000 ug | ORAL_CAPSULE | Freq: Every day | ORAL | Status: DC
  Administered 2010-11-09 – 2010-11-11 (×3): 24 ug via ORAL
  Filled 2010-11-09 (×4): qty 1

## 2010-11-09 MED ORDER — ZIPRASIDONE HCL 80 MG PO CAPS
160.0000 mg | ORAL_CAPSULE | Freq: Every day | ORAL | Status: DC
  Administered 2010-11-09 – 2010-11-10 (×2): 160 mg via ORAL
  Filled 2010-11-09 (×3): qty 2

## 2010-11-09 MED ORDER — NEOSTIGMINE METHYLSULFATE 1 MG/ML IJ SOLN
INTRAMUSCULAR | Status: AC
  Filled 2010-11-09: qty 10

## 2010-11-09 MED ORDER — MIDAZOLAM HCL 2 MG/2ML IJ SOLN
INTRAMUSCULAR | Status: AC
  Filled 2010-11-09: qty 2

## 2010-11-09 MED ORDER — SODIUM CHLORIDE 0.9 % IR SOLN
Status: DC | PRN
  Administered 2010-11-09: 1000 mL

## 2010-11-09 MED ORDER — PROPOFOL 10 MG/ML IV EMUL
INTRAVENOUS | Status: DC | PRN
  Administered 2010-11-09: 50 mg via INTRAVENOUS
  Administered 2010-11-09: 150 mg via INTRAVENOUS

## 2010-11-09 MED ORDER — FENTANYL CITRATE 0.05 MG/ML IJ SOLN
25.0000 ug | INTRAMUSCULAR | Status: DC | PRN
  Administered 2010-11-09 (×2): 50 ug via INTRAVENOUS

## 2010-11-09 MED ORDER — GLYCOPYRROLATE 0.2 MG/ML IJ SOLN
INTRAMUSCULAR | Status: DC | PRN
  Administered 2010-11-09: .2 mg via INTRAVENOUS

## 2010-11-09 MED ORDER — ROCURONIUM BROMIDE 50 MG/5ML IV SOLN
INTRAVENOUS | Status: AC
  Filled 2010-11-09: qty 1

## 2010-11-09 MED ORDER — FENTANYL CITRATE 0.05 MG/ML IJ SOLN
INTRAMUSCULAR | Status: DC | PRN
  Administered 2010-11-09 (×4): 50 ug via INTRAVENOUS
  Administered 2010-11-09 (×2): 25 ug via INTRAVENOUS
  Administered 2010-11-09 (×2): 50 ug via INTRAVENOUS

## 2010-11-09 MED ORDER — SUCCINYLCHOLINE CHLORIDE 20 MG/ML IJ SOLN
INTRAMUSCULAR | Status: AC
  Filled 2010-11-09: qty 1

## 2010-11-09 MED ORDER — MIDAZOLAM HCL 2 MG/2ML IJ SOLN
INTRAMUSCULAR | Status: AC
  Administered 2010-11-09: 2 mg via INTRAVENOUS
  Filled 2010-11-09: qty 2

## 2010-11-09 MED ORDER — MIDAZOLAM HCL 2 MG/2ML IJ SOLN: 1.0000 mg | INTRAMUSCULAR | Status: DC | PRN

## 2010-11-09 MED ORDER — SERTRALINE HCL 50 MG PO TABS
100.0000 mg | ORAL_TABLET | Freq: Every day | ORAL | Status: DC
  Administered 2010-11-09 – 2010-11-11 (×3): 100 mg via ORAL
  Filled 2010-11-09 (×3): qty 2

## 2010-11-09 MED ORDER — SUCCINYLCHOLINE CHLORIDE 20 MG/ML IJ SOLN
INTRAMUSCULAR | Status: DC | PRN
  Administered 2010-11-09: 120 mg via INTRAVENOUS

## 2010-11-09 MED ORDER — TRAZODONE HCL 50 MG PO TABS
150.0000 mg | ORAL_TABLET | Freq: Every day | ORAL | Status: DC
  Administered 2010-11-09 – 2010-11-10 (×2): 150 mg via ORAL
  Filled 2010-11-09 (×2): qty 3

## 2010-11-09 MED ORDER — QUETIAPINE FUMARATE 100 MG PO TABS
600.0000 mg | ORAL_TABLET | Freq: Every day | ORAL | Status: DC
  Administered 2010-11-09 – 2010-11-10 (×2): 600 mg via ORAL
  Filled 2010-11-09 (×2): qty 6

## 2010-11-09 MED ORDER — ONDANSETRON HCL 4 MG/2ML IJ SOLN
4.0000 mg | Freq: Once | INTRAMUSCULAR | Status: AC
  Administered 2010-11-09: 4 mg via INTRAVENOUS

## 2010-11-09 MED ORDER — MIDAZOLAM HCL 5 MG/5ML IJ SOLN
INTRAMUSCULAR | Status: DC | PRN
Start: 2010-11-09 — End: 2010-11-09
  Administered 2010-11-09: 2 mg via INTRAVENOUS

## 2010-11-09 MED ORDER — ROCURONIUM BROMIDE 100 MG/10ML IV SOLN
INTRAVENOUS | Status: DC | PRN
Start: 2010-11-09 — End: 2010-11-09
  Administered 2010-11-09: 25 mg via INTRAVENOUS
  Administered 2010-11-09: 5 mg via INTRAVENOUS

## 2010-11-09 MED ORDER — SODIUM CHLORIDE 0.9 % IR SOLN
Status: DC | PRN
  Administered 2010-11-09: 3000 mL

## 2010-11-09 MED ORDER — LIDOCAINE HCL (PF) 1 % IJ SOLN
INTRAMUSCULAR | Status: AC
  Filled 2010-11-09: qty 5

## 2010-11-09 MED ORDER — PROPOFOL 10 MG/ML IV EMUL
INTRAVENOUS | Status: AC
  Filled 2010-11-09: qty 20

## 2010-11-09 MED ORDER — BUPIVACAINE HCL (PF) 0.5 % IJ SOLN
INTRAMUSCULAR | Status: DC | PRN
  Administered 2010-11-09: 10 mL

## 2010-11-09 MED ORDER — HYDROCODONE-ACETAMINOPHEN 5-325 MG PO TABS
1.0000 | ORAL_TABLET | ORAL | Status: DC | PRN
  Administered 2010-11-11: 2 via ORAL
  Filled 2010-11-09: qty 2

## 2010-11-09 MED ORDER — HYDROMORPHONE HCL 1 MG/ML IJ SOLN
1.0000 mg | INTRAMUSCULAR | Status: DC | PRN
  Administered 2010-11-09: 1 mg via INTRAVENOUS
  Administered 2010-11-09 (×2): 2 mg via INTRAVENOUS
  Administered 2010-11-10 (×2): 1 mg via INTRAVENOUS
  Administered 2010-11-10: 2 mg via INTRAVENOUS
  Administered 2010-11-10: 1 mg via INTRAVENOUS
  Filled 2010-11-09: qty 2
  Filled 2010-11-09: qty 1
  Filled 2010-11-09 (×2): qty 2
  Filled 2010-11-09 (×3): qty 1

## 2010-11-09 MED ORDER — ONDANSETRON HCL 4 MG/2ML IJ SOLN
INTRAMUSCULAR | Status: AC
  Administered 2010-11-09: 4 mg via INTRAVENOUS
  Filled 2010-11-09: qty 2

## 2010-11-09 MED ORDER — GLYCOPYRROLATE 0.2 MG/ML IJ SOLN: 0.2000 mg | Freq: Once | INTRAMUSCULAR | Status: AC | PRN

## 2010-11-09 MED ORDER — FENTANYL CITRATE 0.05 MG/ML IJ SOLN
INTRAMUSCULAR | Status: AC
  Filled 2010-11-09: qty 5

## 2010-11-09 SURGICAL SUPPLY — 42 items
APL SKNCLS STERI-STRIP NONHPOA (GAUZE/BANDAGES/DRESSINGS) ×1
APPLIER CLIP UNV 5X34 EPIX (ENDOMECHANICALS) ×2 IMPLANT
APR XCLPCLP 20M/L UNV 34X5 (ENDOMECHANICALS) ×1
BAG HAMPER (MISCELLANEOUS) ×2 IMPLANT
BAG SPEC RTRVL LRG 6X4 10 (ENDOMECHANICALS) ×2
BENZOIN TINCTURE PRP APPL 2/3 (GAUZE/BANDAGES/DRESSINGS) ×2 IMPLANT
CLOTH BEACON ORANGE TIMEOUT ST (SAFETY) ×2 IMPLANT
COVER LIGHT HANDLE STERIS (MISCELLANEOUS) ×4 IMPLANT
DECANTER SPIKE VIAL GLASS SM (MISCELLANEOUS) ×2 IMPLANT
DEVICE TROCAR PUNCTURE CLOSURE (ENDOMECHANICALS) ×2 IMPLANT
DURAPREP 26ML APPLICATOR (WOUND CARE) ×2 IMPLANT
ELECT REM PT RETURN 9FT ADLT (ELECTROSURGICAL) ×2
ELECTRODE REM PT RTRN 9FT ADLT (ELECTROSURGICAL) ×1 IMPLANT
FILTER SMOKE EVAC LAPAROSHD (FILTER) ×2 IMPLANT
FORMALIN 10 PREFIL 120ML (MISCELLANEOUS) ×2 IMPLANT
GLOVE BIOGEL PI IND STRL 7.0 (GLOVE) IMPLANT
GLOVE BIOGEL PI IND STRL 7.5 (GLOVE) ×1 IMPLANT
GLOVE BIOGEL PI IND STRL 8.5 (GLOVE) IMPLANT
GLOVE BIOGEL PI INDICATOR 7.0 (GLOVE) ×2
GLOVE BIOGEL PI INDICATOR 7.5 (GLOVE) ×1
GLOVE BIOGEL PI INDICATOR 8.5 (GLOVE) ×1
GLOVE ECLIPSE 6.5 STRL STRAW (GLOVE) ×1 IMPLANT
GLOVE ECLIPSE 7.0 STRL STRAW (GLOVE) ×2 IMPLANT
GLOVE ECLIPSE 8.0 STRL XLNG CF (GLOVE) ×1 IMPLANT
GOWN BRE IMP SLV AUR XL STRL (GOWN DISPOSABLE) ×5 IMPLANT
GOWN STRL REIN 3XL LVL4 (GOWN DISPOSABLE) ×1 IMPLANT
HEMOSTAT SNOW SURGICEL 2X4 (HEMOSTASIS) ×2 IMPLANT
INST SET LAPROSCOPIC AP (KITS) ×2 IMPLANT
IV NS IRRIG 3000ML ARTHROMATIC (IV SOLUTION) ×2 IMPLANT
KIT ROOM TURNOVER APOR (KITS) ×2 IMPLANT
KIT TROCAR LAP CHOLE (TROCAR) ×2 IMPLANT
MANIFOLD NEPTUNE II (INSTRUMENTS) ×2 IMPLANT
PACK LAP CHOLE LZT030E (CUSTOM PROCEDURE TRAY) ×2 IMPLANT
PAD ARMBOARD 7.5X6 YLW CONV (MISCELLANEOUS) ×2 IMPLANT
POUCH SPECIMEN RETRIEVAL 10MM (ENDOMECHANICALS) ×3 IMPLANT
SET BASIN LINEN APH (SET/KITS/TRAYS/PACK) ×2 IMPLANT
SET TUBE IRRIG SUCTION NO TIP (IRRIGATION / IRRIGATOR) ×1 IMPLANT
SLEEVE Z-THREAD 5X100MM (TROCAR) ×2 IMPLANT
STRIP CLOSURE SKIN 1/2X4 (GAUZE/BANDAGES/DRESSINGS) ×2 IMPLANT
SUT MNCRL AB 4-0 PS2 18 (SUTURE) ×4 IMPLANT
SUT VIC AB 2-0 CT2 27 (SUTURE) ×3 IMPLANT
WARMER LAPAROSCOPE (MISCELLANEOUS) ×2 IMPLANT

## 2010-11-09 NOTE — Progress Notes (Signed)
Heart rate 128-132.  Dr. Tollie Eth notified.  No new orders given.

## 2010-11-09 NOTE — Interval H&P Note (Signed)
Pt seen and evaluated in Pre-op.  No acute change from progress notes or H&P.  R/B/A of the procedure again discussed.  Will proceed to OR as consented.

## 2010-11-09 NOTE — Anesthesia Preprocedure Evaluation (Addendum)
Anesthesia Evaluation  Name, MR# and DOB Patient awake  General Assessment Comment  Reviewed: Allergy & Precautions, H&P , NPO status , Patient's Chart, lab work & pertinent test results  History of Anesthesia Complications Negative for: history of anesthetic complications  Airway Mallampati: I TM Distance: >3 FB Neck ROM: Full    Dental  (+) Missing and Chipped,    Pulmonary    pulmonary exam normalPulmonary Exam Normal     Cardiovascular - dysrhythmias (sinus tachycardia) I+ Valvular Problems/Murmurs (probably mitral valve prolapse, difficult to hear) Regular Tachycardia+ Systolic murmurs- Diastolic murmurs, - Friction Rub, - Carotid Bruit, - Peripheral Edema and - Systolic Click    Neuro/Psych    (+) PSYCHIATRIC DISORDERS, Anxiety, Depression,  Negative Neurological ROS    GI/Hepatic/Renal negative GI ROS, negative Liver ROS, and negative Renal ROS (+)       Endo/Other  (+)   Morbid obesity  Abdominal (+) obese,  Abdomen: soft. Bowel sounds: normal.  Musculoskeletal negative musculoskeletal ROS (+)   Hematology negative hematology ROS (+)   Peds  Reproductive/Obstetrics negative OB ROS    Anesthesia Other Findings            Anesthesia Physical Anesthesia Plan  ASA: III  Anesthesia Plan: General   Post-op Pain Management:    Induction: Intravenous, Rapid sequence and Cricoid pressure planned  Airway Management Planned: Oral ETT  Additional Equipment:   Intra-op Plan:   Post-operative Plan: Extubation in OR  Informed Consent: I have reviewed the patients History and Physical, chart, labs and discussed the procedure including the risks, benefits and alternatives for the proposed anesthesia with the patient or authorized representative who has indicated his/her understanding and acceptance.   Dental advisory given  Plan Discussed with: CRNA  Anesthesia Plan Comments:          Anesthesia Quick Evaluation

## 2010-11-09 NOTE — Op Note (Signed)
Patient:  Karla Anderson  DOB:  06/30/1970  MRN:  [id number]   Preop Diagnosis:  Acute cholecystitis  Postop Diagnosis:  The same  Procedure:  Laparoscopic cholecystectomy  Surgeon:  Dr. Tilford Pillar  Anes:  Gen. endotracheal  Indications:  Patient is a 40 year old female presented to [facility] with right upper quadrant abdominal pain. Workup was consistent for acute cholecystitis. Risks benefits alternatives of a laparoscopic possible open cholecystectomy were discussed at length the patient, including but not limited to bleeding, infection, bile leak, small bowel injury, common bile duct injury, intraoperative cardiac and pulmonary events. His questions and concerns were addressed the patient was consented for planned procedure.  Procedure note:  Patient was taken to the OR and placed in a supine position on the OR table. Patient was administered general anesthetic once patient was asleep she was endotracheally sedated by anesthesiology. This point her abdomen was prepped with DuraPrep solution and draped in standard fashion. A stab incision was created supraumbilically with an 11 blade scalpel additional dissection down through the subcuticular tissue was carried out using a Coker clamp. This was utilized to grasp the anterior nominal fascia and lift his anteriorly. A Veress needle was inserted saline drop test was utilized to confirm intraperitoneal placement. At this point pneumoperitoneum was initiated. Once sufficient pneumoperitoneum was obtained, an 11 mm trocar  was inserted over the laparoscope allowing visualization the trocar entering into the peritoneal cavity. At this point the inner cannula was removed the laparoscope was reinserted there is no evidence of any trocar or Veress needle placement injury. At this point the remaining trochars replaced with a 5 mm trocar in the epigastrium a 5 mm trocar in the midline and a 5 mm trocar in the right lateral abdominal wall. The  fundus of the gallbladder was grasped and lifted up and over the right lobe of the liver. The patient was placed into reverse Trendelenburg left lateral decubitus position. The peritoneal reflection off the infundibulum was bluntly stripped using a Art gallery manager. A window was created behind the cystic duct and cystic artery. 3 endoclips were placed proximally on the cystic duct one distally and the cystic duct was divided between the 2 most distal clips. Similarly the cystic artery was ligated with 2 endoclips proximally one distally and the cystic artery was divided between 2 most distal clips. At this point the gallbladder was removed the gallbladder fossa using electrocautery. A small accessory piece of liver was also removed with intimately attached to the fundus of the gallbladder. Small cholecystotomy was created during the dissection. Was quickly controlled. The gallbladder was placed into an Endo Catch bag as was the stone that escape from the cholecystotomy. The Endo Catch bag was placed into the right lower quadrant. At this point the hemostasis was obtained in the gallbladder fossa. Due to the raw nature of the gallbladder fossa did opt to place a piece of Surgicel snow into the gallbladder fossa. The endoclips were inspected there is no evidence of any bleeding or bile leak. At this time attention was turned to closure.  The 10 mm scope was exchanged for a 5 mm scope. At this point a Endo Close suture passing device was utilized to pass a 2-0 Vicryl sutures to the umbilical trocar site. With the suture in place the gallbladder was retrieved was removed through the umbilical trocar site and intact Endo Catch bag. To note during this removal 1 of the Endo Catch bags did tear it was removed in  its entirety. Due to the fact that there is potential for stone spillage I did opt to bring a second Endo Catch bag to the field. The second Endo Catch bag was retrieved without difficulty through the umbilical  trocar site and intact. The gallbladder was placed in the back table and sent as a permanent specimen to pathology. At this point the pneumoperitoneum was evacuated. The trochars were removed. The Vicryl suture was secured. Local anesthetic was instilled. A 4-0 Monocryl was utilized to reapproximate the skin edges at all 4 trocar sites. The skin was washed and dried a moist dry towel. The benzoin is applied around the incision and then covered with half-inch Steri-Strips. At this point the drapes were removed the patient was allowed to come out of general anesthetic and was transferred back to PACU in stable condition. At the conclusion of procedure all isthmus sponge and needle counts are correct the patient tolerated procedure extremely well.  Complications:  None  EBL:  Minimal  Specimen:  Gallbladder

## 2010-11-09 NOTE — Addendum Note (Signed)
Addendum  created 11/09/10 1430 by Minerva Areola, CRNA   Modules edited:Anesthesia Flowsheet

## 2010-11-09 NOTE — Transfer of Care (Signed)
Immediate Anesthesia Transfer of Care Note  Patient: Karla Anderson  Procedure(s) Performed:  LAPAROSCOPIC CHOLECYSTECTOMY  Patient Location: PACU  Anesthesia Type: General  Level of Consciousness: awake  Airway & Oxygen Therapy: Patient Spontanous Breathing and non-rebreather face mask  Post-op Assessment: Report given to PACU RN, Post -op Vital signs reviewed and stable and Patient moving all extremities X 4  Post vital signs: Reviewed and stable  Complications: No apparent anesthesia complications

## 2010-11-09 NOTE — Addendum Note (Signed)
Addendum  created 11/09/10 1430 by Treylin Burtch, CRNA   Modules edited:Anesthesia Flowsheet    

## 2010-11-09 NOTE — Anesthesia Procedure Notes (Signed)
Procedure Name: Intubation Date/Time: 11/09/2010 8:51 AM Performed by: Minerva Areola Pre-anesthesia Checklist: Patient identified, Patient being monitored, Timeout performed, Emergency Drugs available and Suction available Patient Re-evaluated:Patient Re-evaluated prior to inductionOxygen Delivery Method: Circle System Utilized Preoxygenation: Pre-oxygenation with 100% oxygen Intubation Type: Rapid sequence, Circoid Pressure applied and IV induction Laryngoscope Size: Miller and 2 Grade View: Grade I Tube type: Oral Tube size: 7.0 mm Number of attempts: 1 Airway Equipment and Method: stylet Placement Confirmation: ETT inserted through vocal cords under direct vision,  positive ETCO2 and breath sounds checked- equal and bilateral Secured at: 21 cm Tube secured with: Tape Dental Injury: Teeth and Oropharynx as per pre-operative assessment

## 2010-11-09 NOTE — Anesthesia Postprocedure Evaluation (Signed)
  Anesthesia Post-op Note  Patient: Karla Anderson  Procedure(s) Performed:  LAPAROSCOPIC CHOLECYSTECTOMY  Patient Location: PACU  Anesthesia Type: General  Level of Consciousness: awake, alert , oriented and patient cooperative  Airway and Oxygen Therapy: Patient Spontanous Breathing  Post-op Pain: 6 /10, moderate  Post-op Assessment: Post-op Vital signs reviewed, Patient's Cardiovascular Status Stable, Respiratory Function Stable, No signs of Nausea or vomiting and Pain level controlled  Post-op Vital Signs: Reviewed and stable  Complications: No apparent anesthesia complications

## 2010-11-09 NOTE — Progress Notes (Signed)
  Pt seen and evaluated in Pre-op. No acute change from progress notes or H&P. R/B/A of the procedure again discussed. Will proceed to OR as consented.

## 2010-11-10 NOTE — Progress Notes (Signed)
1 Day Post-Op  Subjective: Pain fairly well controlled.  Some nausea.  Ambulating a bit.  Objective: Vital signs in last 24 hours: Temp:  [98 F (36.7 C)-98.3 F (36.8 C)] 98 F (36.7 C) (08/11 0601) Pulse Rate:  [110-122] 122  (08/11 0758) Resp:  [16-20] 16  (08/11 0601) BP: (138-161)/(89-100) 161/100 mmHg (08/11 0601) SpO2:  [87 %-100 %] 92 % (08/11 0758) Last BM Date: 11/07/10  Intake/Output from previous day: 08/10 0701 - 08/11 0700 In: 1020 [P.O.:120; I.V.:900] Out: 26 [Urine:1; Blood:25] Intake/Output this shift: I/O this shift: In: 240 [P.O.:240] Out: -   General appearance: alert and no distress GI: Soft, obese, expected RUQ tenderness.  Inc c/d/i.  No peritoneal signs.  Lab Results:  @LABLAST2 (wbc:2,hgb:2,hct:2,plt:2) BMET  Basename 11/09/10 0530 11/08/10 0525  NA 138 136  K 4.0 3.9  CL 100 102  CO2 22 24  GLUCOSE 87 83  BUN 6 6  CREATININE 1.03 0.96  CALCIUM 8.7 8.0*   PT/INR No results found for this basename: LABPROT:2,INR:2 in the last 72 hours ABG No results found for this basename: PHART:2,PCO2:2,PO2:2,HCO3:2 in the last 72 hours  Studies/Results: No results found.  Anti-infectives: Anti-infectives    None      Assessment/Plan: s/p Procedure(s): LAPAROSCOPIC CHOLECYSTECTOMY Advance diet as tolerated.  Post-op nausea:  monitor.  Likelty home tomorrw if continues to improve.  LOS: 4 days    Karla Anderson C 11/10/2010

## 2010-11-10 NOTE — Anesthesia Postprocedure Evaluation (Signed)
Anesthesia Post Note  Patient: Karla Anderson  Procedure(s) Performed:  LAPAROSCOPIC CHOLECYSTECTOMY  Anesthesia type: General  Patient location:Rm 337  Post pain: Pain level controlled,sleepy  Post assessment: Post-op Vital signs reviewed, Patient's Cardiovascular Status Stable, Respiratory Function Stable, Patent Airway, No signs of Nausea or vomiting and Pain level controlled  Last Vitals:  Filed Vitals:   11/10/10 0758  BP:   Pulse: 122  Temp:   Resp:     Post vital signs: Reviewed and stable Hr remains elevated 120"s  Level of consciousness:tsleepy  Complications: No apparent anesthesia complications  Anesthesia Post Note  Patient: Karla Anderson  Procedure(s) Performed:  LAPAROSCOPIC CHOLECYSTECTOMY  Anesthesia type: General  Patient location: PACU  Post pain: Pain level controlled  Post assessment: Post-op Vital signs reviewed, Patient's Cardiovascular Status Stable, Respiratory Function Stable, Patent Airway, No signs of Nausea or vomiting and Pain level controlled  Last Vitals:  Filed Vitals:   11/10/10 0758  BP:   Pulse: 122  Temp:   Resp:     Post vital signs: Reviewed and stable  Level of consciousness: awake and alert   Complications: No apparent anesthesia complications

## 2010-11-10 NOTE — Progress Notes (Signed)
Encounter addended by: Sachit Gilman, CRNA on: 11/10/2010 10:14 AM<BR>     Documentation filed: Notes Section

## 2010-11-11 MED ORDER — HYDROCODONE-ACETAMINOPHEN 5-325 MG PO TABS: 1.0000 | ORAL_TABLET | ORAL | Status: AC | PRN

## 2010-11-11 NOTE — Progress Notes (Signed)
Discharged home with family.

## 2010-11-11 NOTE — Discharge Summary (Signed)
Physician Discharge Summary  Patient ID: Karla Anderson MRN: [id number] DOB/AGE: Dec 26, 1970 40 y.o.  Admit date: 11/06/2010 Discharge date: 11/11/2010  Admission Diagnoses: Acute cholecystitis  Discharge Diagnoses: Acute cholecystitis status post laparoscopic cholecystectomy, bipolar, depression Active Problems:  * No active hospital problems. *    Discharged Condition: stable  Hospital Course: Patient was admitted with right upper quadrant abdominal pain. She was kept in an n.p.o. status. Kept on IV fluid hydration and IV antibiotics. She was taken to the operating room for laparoscopic cholecystectomy. She tolerated this extremely well. She was advanced back on a diet. Her pain was fairly well controlled on oral pain medications and plans were made for discharge to home.  Consults: none  Significant Diagnostic Studies: radiology: CT scan: Abdomen and pelvis demonstrating cholelithiasis and distention the gallbladder.  Treatments: surgery: Laparoscopic cholecystectomy  Discharge Exam: Blood pressure 136/85, pulse 113, temperature 97.5 F (36.4 C), temperature source Oral, resp. rate 24, height 5\' 5"  (1.651 m), weight 113.399 kg (250 lb), last menstrual period 10/01/2010, SpO2 98.00%. General appearance: alert and no distress Resp: clear to auscultation bilaterally Cardio: regular rate and rhythm GI: Positive bowel sounds, soft, obese, expected postoperative pain. Incisions clean dry and intact.  Disposition: Home or Self Care  Discharge Orders    Future Orders Please Complete By Expires   Diet - low sodium heart healthy      Increase activity slowly      Discharge instructions      Comments:   Increase activity as tolerated.   Driving Restrictions      Comments:   No driving while on pain  Medications.   Lifting restrictions      Comments:   No lifting over 20lbs for 4 wks    Discharge wound care:      Comments:   Soap and water.   Call MD for:  temperature >100.4       Call MD for:  persistant nausea and vomiting      Call MD for:  severe uncontrolled pain      Call MD for:  redness, tenderness, or signs of infection (pain, swelling, redness, odor or green/yellow discharge around incision site)        Current Discharge Medication List    START taking these medications   Details  HYDROcodone-acetaminophen (NORCO) 5-325 MG per tablet Take 1-2 tablets by mouth every 4 (four) hours as needed for pain. Qty: 45 tablet, Refills: 0      CONTINUE these medications which have NOT CHANGED   Details  hydrOXYzine (ATARAX) 50 MG tablet Take 50 mg by mouth every 6 (six) hours as needed. For nerves     lubiprostone (AMITIZA) 24 MCG capsule Take 24 mcg by mouth daily.      QUEtiapine (SEROQUEL) 300 MG tablet Take 600 mg by mouth at bedtime.      ranitidine (ZANTAC) 150 MG tablet Take 150 mg by mouth 2 (two) times daily.      sertraline (ZOLOFT) 100 MG tablet Take 100 mg by mouth daily.      traZODone (DESYREL) 150 MG tablet Take 150 mg by mouth at bedtime.      ziprasidone (GEODON) 80 MG capsule Take 160 mg by mouth at bedtime.      zolpidem (AMBIEN) 10 MG tablet Take 10 mg by mouth at bedtime.         Follow-up Information    Follow up with Tanelle Lanzo C in 3 weeks.   Contact information:  [address] [place] [address] [phone]          Signed: Fabio Bering 11/11/2010, 3:01 PM

## 2010-11-15 ENCOUNTER — Encounter (HOSPITAL_COMMUNITY): Payer: Self-pay | Admitting: General Surgery

## 2010-11-18 ENCOUNTER — Inpatient Hospital Stay (HOSPITAL_COMMUNITY)
Admission: EM | Admit: 2010-11-18 | Discharge: 2010-11-26 | DRG: 327 | Disposition: A | Discharge status: Home / Self Care | Payer: Medicaid Other | Attending: General Surgery | Admitting: General Surgery

## 2010-11-18 ENCOUNTER — Encounter (HOSPITAL_COMMUNITY): Payer: Self-pay | Admitting: *Deleted

## 2010-11-18 ENCOUNTER — Emergency Department (HOSPITAL_COMMUNITY): Payer: Medicaid Other

## 2010-11-18 DIAGNOSIS — F319 Bipolar disorder, unspecified: Secondary | ICD-10-CM | POA: Diagnosis present

## 2010-11-18 DIAGNOSIS — Z9089 Acquired absence of other organs: Secondary | ICD-10-CM

## 2010-11-18 DIAGNOSIS — K264 Chronic or unspecified duodenal ulcer with hemorrhage: Principal | ICD-10-CM | POA: Diagnosis present

## 2010-11-18 DIAGNOSIS — N179 Acute kidney failure, unspecified: Secondary | ICD-10-CM | POA: Diagnosis present

## 2010-11-18 DIAGNOSIS — E876 Hypokalemia: Secondary | ICD-10-CM | POA: Diagnosis not present

## 2010-11-18 DIAGNOSIS — K922 Gastrointestinal hemorrhage, unspecified: Secondary | ICD-10-CM | POA: Diagnosis present

## 2010-11-18 DIAGNOSIS — D62 Acute posthemorrhagic anemia: Secondary | ICD-10-CM | POA: Diagnosis present

## 2010-11-18 LAB — DIFFERENTIAL
Basophils Absolute: 0 10*3/uL (ref 0.0–0.1)
Eosinophils Relative: 1 % (ref 0–5)
Lymphocytes Relative: 22 % (ref 12–46)
Monocytes Relative: 8 % (ref 3–12)
Neutrophils Relative %: 69 % (ref 43–77)

## 2010-11-18 LAB — CBC
MCV: 90 fL (ref 78.0–100.0)
Platelets: 357 10*3/uL (ref 150–400)
RBC: 2.09 MIL/uL — ABNORMAL LOW (ref 3.87–5.11)
RDW: 15.9 % — ABNORMAL HIGH (ref 11.5–15.5)
WBC: 8.2 10*3/uL (ref 4.0–10.5)

## 2010-11-18 LAB — GLUCOSE, CAPILLARY: Glucose-Capillary: 120 mg/dL — ABNORMAL HIGH (ref 70–99)

## 2010-11-18 LAB — HEPATIC FUNCTION PANEL
Bilirubin, Direct: 0.1 mg/dL (ref 0.0–0.3)
Indirect Bilirubin: 0.1 mg/dL — ABNORMAL LOW (ref 0.3–0.9)

## 2010-11-18 LAB — BASIC METABOLIC PANEL
CO2: 22 mEq/L (ref 19–32)
Chloride: 105 mEq/L (ref 96–112)
Creatinine, Ser: 1.24 mg/dL — ABNORMAL HIGH (ref 0.50–1.10)
GFR calc Af Amer: 58 mL/min — ABNORMAL LOW (ref >60)
Potassium: 3.9 mEq/L (ref 3.5–5.1)
Sodium: 136 mEq/L (ref 135–145)

## 2010-11-18 LAB — PREPARE RBC (CROSSMATCH)

## 2010-11-18 MED ORDER — SODIUM CHLORIDE 0.9 % IJ SOLN
INTRAMUSCULAR | Status: AC
  Filled 2010-11-18: qty 20

## 2010-11-18 MED ORDER — ONDANSETRON HCL 4 MG/2ML IJ SOLN: 4.0000 mg | Freq: Four times a day (QID) | INTRAMUSCULAR | Status: DC | PRN

## 2010-11-18 MED ORDER — INSULIN ASPART 100 UNIT/ML ~~LOC~~ SOLN
0.0000 [IU] | SUBCUTANEOUS | Status: DC
  Administered 2010-11-19: 2 [IU] via SUBCUTANEOUS
  Administered 2010-11-20 – 2010-11-26 (×5): 1 [IU] via SUBCUTANEOUS
  Filled 2010-11-18: qty 3

## 2010-11-18 MED ORDER — DIPHENHYDRAMINE HCL 50 MG/ML IJ SOLN: 25.0000 mg | Freq: Once | INTRAMUSCULAR | Status: DC

## 2010-11-18 MED ORDER — PROMETHAZINE HCL 12.5 MG PO TABS: 12.5000 mg | ORAL_TABLET | Freq: Four times a day (QID) | ORAL | Status: DC | PRN

## 2010-11-18 MED ORDER — ONDANSETRON HCL 4 MG/2ML IJ SOLN
4.0000 mg | Freq: Once | INTRAMUSCULAR | Status: AC
  Administered 2010-11-18: 4 mg via INTRAVENOUS
  Filled 2010-11-18: qty 2

## 2010-11-18 MED ORDER — SODIUM CHLORIDE 0.9 % IV SOLN
INTRAVENOUS | Status: DC
  Administered 2010-11-19: 04:00:00 via INTRAVENOUS

## 2010-11-18 MED ORDER — SODIUM CHLORIDE 0.9 % IV SOLN
80.0000 mg | Freq: Once | INTRAVENOUS | Status: AC
  Administered 2010-11-18: 80 mg via INTRAVENOUS
  Filled 2010-11-18 (×2): qty 80

## 2010-11-18 MED ORDER — INSULIN ASPART 100 UNIT/ML ~~LOC~~ SOLN: 0.0000 [IU] | Freq: Every day | SUBCUTANEOUS | Status: DC

## 2010-11-18 MED ORDER — ONDANSETRON HCL 4 MG PO TABS
4.0000 mg | ORAL_TABLET | Freq: Four times a day (QID) | ORAL | Status: DC | PRN
  Administered 2010-11-18: 4 mg via ORAL
  Filled 2010-11-18: qty 1

## 2010-11-18 MED ORDER — SODIUM CHLORIDE 0.9 % IJ SOLN
INTRAMUSCULAR | Status: AC
  Filled 2010-11-18: qty 10

## 2010-11-18 MED ORDER — PROMETHAZINE HCL 25 MG/ML IJ SOLN: 12.5000 mg | Freq: Four times a day (QID) | INTRAMUSCULAR | Status: DC | PRN

## 2010-11-18 MED ORDER — SODIUM CHLORIDE 0.9 % IV SOLN
8.0000 mg/h | INTRAVENOUS | Status: DC
  Administered 2010-11-18 – 2010-11-19 (×3): 8 mg/h via INTRAVENOUS
  Filled 2010-11-18 (×10): qty 80

## 2010-11-18 NOTE — ED Notes (Signed)
EDP in with pt at this time.  

## 2010-11-18 NOTE — H&P (Signed)
PCP:  [facility].    Chief Complaint:  Vomiting blood this morning.  HPI: This is a 40 year old obese Caucasian lady, with a history of bipolar depression who is status post laparoscopic cholecystectomy last week with Dr. Tilford Pillar, and who reports that since discharge about one week local she's been having recurrent episodes of dizziness and falling. This morning patient felt too sick to go to church and stayed home she reports that just after her boyfriend left for church of about 11 AM she had an episode of vomiting of bright red blood in family members brought her to the emergency room for evaluation.   Patient denies dyspeptic denies use of alcohol or excessively spicy food and denies black stool. She initially denied use of Ibuprofen, Advil or Aleve with repeated questioning, but later admitted to a year his of ibuprofen use for arthritis.  In the emergency room patient was found to have a blood pressure in the 80s and blood transfusion was started.  Denies any previous history of stomach problems or vomiting.  Review of Systems:  The patient denies anorexia, fever, weight loss,, vision loss, decreased hearing, hoarseness, chest pain,  dyspnea on exertion, peripheral edema, balance deficits, hemoptysis, abdominal pain, melena, hematochezia, severe indigestion/heartburn, hematuria, incontinence, genital sores, muscle weakness, suspicious skin lesions, transient blindness, difficulty walking, unusual weight change, abnormal bleeding, enlarged lymph nodes, angioedema, and breast masses.  Past Medical History: Past Medical History  Diagnosis Date  . Tachycardia   . Anxiety   . Depression   . Heart murmur   . Back pain   . Bipolar 1 disorder   . Arthritis     bil knee   Past Surgical History  Procedure Date  . Cesarean section   . Carpel tunn Carpel tunnel sx  . Tubal ligation   . Tubal ligation reversal   . Cholecystectomy 11/09/2010    Procedure: LAPAROSCOPIC  CHOLECYSTECTOMY;  Surgeon: Fabio Bering;  Location: [facility];  Service: General;  Laterality: N/A;    Medications: Prior to Admission medications   Medication Sig Start Date End Date Taking? Authorizing Provider  HYDROcodone-acetaminophen (NORCO) 5-325 MG per tablet Take 1-2 tablets by mouth every 4 (four) hours as needed for pain. 11/11/10 11/21/10 Yes Fabio Bering  hydrOXYzine (ATARAX) 50 MG tablet Take 50 mg by mouth every 6 (six) hours as needed. For nerves    Yes Historical Provider, MD  lubiprostone (AMITIZA) 24 MCG capsule Take 24 mcg by mouth daily.     Yes Historical Provider, MD  QUEtiapine (SEROQUEL) 300 MG tablet Take 600 mg by mouth at bedtime.     Yes Historical Provider, MD  ranitidine (ZANTAC) 150 MG tablet Take 150 mg by mouth 2 (two) times daily.     Yes Historical Provider, MD  sertraline (ZOLOFT) 100 MG tablet Take 100 mg by mouth daily.     Yes Historical Provider, MD  traZODone (DESYREL) 150 MG tablet Take 150 mg by mouth at bedtime.     Yes Historical Provider, MD  ziprasidone (GEODON) 80 MG capsule Take 160 mg by mouth at bedtime.     Yes Historical Provider, MD  zolpidem (AMBIEN) 10 MG tablet Take 10 mg by mouth at bedtime.     Yes Historical Provider, MD    Allergies:  No Known Allergies  Social History:  reports that she has never smoked. She does not have any smokeless tobacco history on file. She reports that she does not drink alcohol or use illicit  drugs. patient is on disability because of her bipolar depression.  Family History: Family History  Problem Relation Age of Onset  . Stroke Father     Died recently, Age 67  . Heart failure Mother   . Diabetes Sister   . Pneumonia Father   . Coronary artery disease Father   . Parkinsonism Mother     Physical Exam: Filed Vitals:   11/18/10 1545 11/18/10 1554 11/18/10 1600 11/18/10 1651  BP:  94/61  103/57  Pulse: 123 118 118 117  Temp:  98.1 F (36.7 C)    TempSrc:  Oral    Resp:    17  Height:       Weight:      SpO2: 100% 100% 100% 100%   General appearance: alert, cooperative, moderately obese and slowed mentation Eyes: conjunctivae/corneas clear. PERRL mm pale and dry Neck: Thick neck  no adenopathy, no carotid bruit, no JVD, supple, symmetrical, trachea midline and thyroid not enlarged, symmetric, no tenderness/mass/nodules Resp: clear to auscultation bilaterally Chest wall: no tenderness Cardio: tachycardic, regular  rhythm, S1, S2 normal, no murmur, click, rub or gallop GI: soft, non-tender; bowel sounds normal; no masses,  no organomegaly Extremities: obese, thick legs,  extremities normal, atraumatic, no cyanosis or edema Skin: Skin color pale, texture, turgor normal. No rashes or lesions CNS: alert, grossly normal.    Labs on Admission:   [facility] 11/18/10 1213  NA 136  K 3.9  CL 105  CO2 22  GLUCOSE 173*  BUN 14  CREATININE 1.24*  CALCIUM 7.5*  MG --  PHOS --    Basename 11/18/10 1213  AST 10  ALT 8  ALKPHOS 75  BILITOT 0.2*  PROT 4.0*  ALBUMIN 1.6*   No results found for this basename: LIPASE:2,AMYLASE:2 in the last 72 hours  Basename 11/18/10 1213  WBC 8.2  NEUTROABS 5.6  HGB 6.2*  HCT 18.8*  MCV 90.0  PLT 357   No results found for this basename: CKTOTAL:3,CKMB:3,CKMBINDEX:3,TROPONINI:3 in the last 72 hours No results found for this basename: TSH,T4TOTAL,FREET3,T3FREE,THYROIDAB in the last 72 hours No results found for this basename: VITAMINB12:2,FOLATE:2,FERRITIN:2,TIBC:2,IRON:2,RETICCTPCT:2 in the last 72 hours  Radiological Exams on Admission: US Abdomen Complete  11/06/2010  *RADIOLOGY REPORT*  Clinical Data:  Right upper quadrant abdominal pain  ABDOMINAL ULTRASOUND COMPLETE  Comparison:  No similar prior study is available for comparison.  Findings:  Gallbladder:  The gallbladder is filled with stones.  The patient reported tenderness with examination of the gallbladder. Gallbladder wall thickness measures 4 mm.  No pericholecystic  fluid identified.  Common Bile Duct:  Within normal limits in caliber.  Liver: No focal mass lesion identified.  Within normal limits in parenchymal echogenicity.  IVC:  Appears normal.  Pancreas:  Obscured by bowel gas  Spleen:  Within normal limits in size and echotexture.  Right kidney:  Normal in size and parenchymal echogenicity.  No evidence of mass or hydronephrosis.  Left kidney:  Normal in size and parenchymal echogenicity.  No evidence of mass or hydronephrosis.  Abdominal Aorta:  No aneurysm identified.  IMPRESSION: Gallbladder filled with stones, mild gallbladder wall thickening, and positive sonographic Murphy's sign.  Constellation of findings is highly suspicious for acute cholecystitis.  Original Report Authenticated By: Harrel Lemon, M.D.   Dg Abd Acute W/chest  11/18/2010  *RADIOLOGY REPORT*  Clinical Data: Hematemesis  ACUTE ABDOMEN SERIES (ABDOMEN 2 VIEW & CHEST 1 VIEW)  Comparison: None.  Findings: Low lung volumes.  Clear lungs.  Normal heart size.  No free intraperitoneal gas no disproportionate dilatation of bowel.  Post cholecystectomy staples.  Unremarkable soft tissues.  IMPRESSION: Nonobstructive bowel gas pattern.  No active cardiopulmonary disease.  Original Report Authenticated By: Donavan Burnet, M.D.    Assessment/Plan Present on Admission:  .Upper GI bleed .Anemia associated with acute blood loss .Bipolar 1 disorder  Probable NSAID induced ulceration.  Will transfuse 4 units to Hb of about 10 mg/dl, and keep 2 units in reserve.  Will continue protonix gtt Consult GI service for EGD  Other plans as per orders.  Critical care time 60 minutes.   Davan Hark 11/18/2010, 4:58 PM

## 2010-11-18 NOTE — ED Notes (Signed)
Continuous protonix 8mg /hr going at 56ml/hr going at this time.

## 2010-11-18 NOTE — ED Notes (Signed)
First bag of blood hanging at this time to r hand.

## 2010-11-18 NOTE — ED Notes (Signed)
Pt taken to xray at this time. No change. Awaiting protonix bag from pharmacy

## 2010-11-18 NOTE — ED Notes (Signed)
Pt is a week post gallbladder removal, started with n/v with  blood this am, per ems they had a hard time palpating radial pulses on their arrival at the house, approx. 1000cc blood with clots at residence. Pt also states that she has fallen three times this past week, pt denies any pain at present, pt pale, clothes have blood on them, pt placed in hospital gown,

## 2010-11-18 NOTE — ED Notes (Signed)
Received critical Lab value from Darl Pikes in lab or HGB 6.2. Will notify RN and MD.

## 2010-11-18 NOTE — ED Notes (Signed)
Pt states she feels better. Dr. Orvan Falconer has been in to see pt. Awaiting to give report.

## 2010-11-18 NOTE — Consult Note (Signed)
NAMEBROOKLYNE, RADKE             ACCOUNT NO.:  [id number]  MEDICAL RECORD NO.:  [id number]  LOCATION:  IC08                          FACILITY:  [facility]  PHYSICIAN:  Lionel December, M.D.    DATE OF BIRTH:  11-25-1970  DATE OF CONSULTATION:  11/18/2010 DATE OF DISCHARGE:                                CONSULTATION   REASON FOR CONSULTATION:  Upper GI bleed and anemia.  HISTORY OF PRESENT ILLNESS:  The patient is a 40 year old Caucasian female who was brought to the emergency room by EMS because of hematemesis, weakness and postural symptoms.  The patient is status post laparoscopic cholecystectomy on November 09, 2010, for symptomatic cholelithiasis.  She went home on November 11, 2010, and was doing quite well.  The patient states that 6 days ago, she woke up 4 in the morning and had a syncopal episode and rest of the day she was fine.  She has done fine until yesterday when she had another syncopal episode.  She states she has been eating well.  She does not recall that she had melena or rectal bleeding.  At this morning, she felt very weak and dizzy when she stood up.  She ate orange for breakfast and soon thereafter she began vomiting.  She vomited bright red blood clots and dark blood.  A 9-1-1 was called.  The patient was brought to emergency room, where she was evaluated by Dr. Karma Ganja and noted to be tachycardic and hypotensive. Her hemoglobin was 6.2 and hematocrit was 18.8.  Her H and H on November 09, 2010, was 12.3 and 39.5, however, on November 07, 2010, was 9.4 and 30.5.  The patient was begun on IV fluids and unit of PRBCs.  She was admitted to ICU by Dr. Orvan Falconer.  She is getting started on her second unit.  She had a NG tube placed and she had some burgundy dark blood, but no fresh blood noted.  The patient denies history of peptic ulcer disease or abdominal pain. She also denies heartburn or dysphagia.  She tells me that she has lost 100 pounds in 2 months because she could  not eat anything because of gallbladder disease.  She complains of chronic back pain as well as knee pain.  She has been on ibuprofen 800 mg up to 4 times a day and last dose was 12 days ago.  Her daughter believes she took a dose yesterday along with a hydrocodone which she had been taking for postop pain.  MEDICATIONS AT HOME: 1. Hydrocodone/acetaminophen 5/25, one to two tablets q.4 p.r.n. pain. 2. Hydroxyzine 50 mg every 6 h. as needed for nerves. 3. Amitiza 24 mcg daily. 4. Seroquel 600 mg daily at bedtime. 5. Ranitidine 150 mg twice daily. 6. Sertraline 100 mg p.o. daily. 7. Trazodone 150 mg p.o. daily at bedtime. 8. Geodon 160 mg daily at bedtime. 9. Zolpidem 10 mg daily at bedtime.  CURRENT MEDICATIONS: 1. Pantoprazole 8 mg per hour as a continuous infusion. 2. She is also receiving normal saline IV fluids. 3. Zofran 4 mg IV or p.o. q.6 p.r.n. 4. Promethazine 12.5 mg IV or p.o. q.6 p.r.n. 5. She is also on coverage with NovoLog via  sliding scale.  PAST MEDICAL HISTORY:  She was diagnosed with bipolar disorder at age 59 and she states this is well controlled.  She is receiving her care through clinic in [place].  She has history of paroxysmal arrhythmia diagnosed 6 years ago, currently on no medications.  History of obesity weight record as above.  She has chronic low back pain, bilateral knee pain secondary to arthritis.  She had facial surgery at age 35 for injury resulting from a dog bite.  She had C-section in 1997.  She had surgery on her left ankle few years ago from fracture secondary to fall.  She has had decompression of bilateral carpal tunnel 5 or 6 years ago.  ALLERGIES:  NK.  FAMILY HISTORY:  Father had multiple problems and died past 04-15-22 at age 1.  Mother is 62 also with multiple problems including Parkinson's disease, fibromyalgia and CHF.  She has a sister who is diabetic overall doing well.  SOCIAL HISTORY:  She is divorced, but she is engaged  to be remarried.  I did talk with her fiance Alinda Money.  She has a daughter age 81.  She works at [employer] for couple years, but now disabled.  She dropped her school in ninth grade and hoping to go back to get her GED.  She has never smoked cigarettes or drank alcohol.  OBJECTIVE:  VITAL SIGNS:  She weighs 120 kg which is equal to 264 pounds.  She is 65 inches tall, pulse 122 per minute, blood pressure 134/60, temperature is 99.1 and respiratory rate is 17. HEENT:  She is pale.  Conjunctivae are also pale.  Sclerae are nonicteric.  Oropharyngeal mucosa is normal.  She has very few teeth left one in the lower jaw is carious. NECK:  No neck masses or thyromegaly noted.  She has NG tube in place. CARDIAC:  With regular rhythm.  Normal S1 and S2.  No murmur or gallop noted. LUNGS:  Clear to auscultation. ABDOMEN:  Obese.  Bowel sounds are normal.  Well-healed a laparoscopy scars.  She has mild tenderness below the right costal margin, but liver edge is not palpable.  No tenderness noted in the epigastric region. Rectal exam is deferred.  No peripheral edema or clubbing or koilonychia noted.  LABORATORY DATA FROM THIS ADMISSION:  WBC 8.2, H and H 6.2 and 18.8, MCV 90, platelet count 357 K.  On November 09, 2010, her H and H was 12.3 and 39.5 and other readings as above.  INR is 1.18 and PTT is 39.  Serum sodium 136, potassium 3.9, chloride 105, CO2 is 22, glucose 173, BUN 14, creatinine 1.24, calcium is 7.4.  Total bilirubin is 0.2, AP 75, AST 10, ALT 8, total protein 4.0 with albumin of 1.6.  Please note, her albumin on November 06, 2010 was 2.6 and 2.5 on November 09, 2010.  ASSESSMENT:  The patient is a 40 year old Caucasian female who presents with upper gastrointestinal bleed and profound anemia.  She is receiving second unit of PRBCs.  She is status post laparoscopic cholecystectomy on November 09, 2010.  She has NG tube placed and it was lavaged with dark blood, but no fresh blood was  noted.  NG tube was, therefore, removed.  The patient has been using fairly high dose of ibuprofen until last week.  It is worrisome to note that she has lost 100 pounds in 2 months; doing this is accurate, and may indicate significant GI disease.  Her serum albumin  was  quite low during her recent hospitalization    Therefore, one has to be concerned about another condition in addition to peptic ulcer disease.  I suspect upper gastrointestinal bleed is secondary to NSAID-induced peptic ulcer disease, although we have to consider other diagnosis in the differential i.e., tumor or leiomyoma or vascular abnormalities.  RECOMMENDATIONS:  Agree with current therapy consisting of IV fluids, PRBCs and continuous infusion with proton-pump inhibitor.  Serial H and H is as you are doing.  She will undergo esophagogastroduodenoscopy in a.m.  I have reviewed the procedure with the patient and she is agreeable.  We will also do urinalysis at some point to make sure she does not have significant proteinuria.  We appreciate the opportunity to participate in care of this nice lady.          ______________________________ Lionel December, M.D.     NR/MEDQ  D:  11/18/2010  T:  11/18/2010  Job:  [id number]

## 2010-11-18 NOTE — Consult Note (Signed)
Job [id number]

## 2010-11-18 NOTE — ED Provider Notes (Signed)
History   Scribed for Karla Chick, MD, the patient was seen in room [facility]. This chart was scribed by Clarita Crane.   CSN: [id number] Arrival date & time: 11/18/2010 12:09 PM  Chief Complaint  Patient presents with  . Hematemesis   HPI  Karla Anderson is a 40 y.o. female who presents to the Emergency Department complaining of hematemesis with onset occuring this morning and associated syncopal episodes. Patient states that hematemesis occurred while getting ready to eat suddenly with clots in emesis and syncopal episode several minutes later. Reports LOC lasted several minutes. Patient reports a history of multiple syncopal episodes within the last six days. States syncopal episodes six days ago was preceded by dizzyness and syncopal episode yesterday preceded by weakness and dizziness. Patient recently had laparoscopic cholecystectomy performed by Dr.Zeigler 9 days ago with no complications. Patient denies history of anemia. LNMP - October 01, 2010.      HPI ELEMENTS: Onset: This morning Timing: Episodic Quality: Clots in emesis.  Context: as above  Associated symptoms: Syncope, dizziness and weakness.    PAST MEDICAL HISTORY:  Past Medical History  Diagnosis Date  . Tachycardia   . Anxiety   . Depression   . Heart murmur   . Back pain   . Bipolar 1 disorder   . Arthritis     bil knee    PAST SURGICAL HISTORY:  Past Surgical History  Procedure Date  . Cesarean section   . Carpel tunn Carpel tunnel sx  . Tubal ligation   . Tubal ligation reversal   . Cholecystectomy 11/09/2010    Procedure: LAPAROSCOPIC CHOLECYSTECTOMY;  Surgeon: Fabio Bering;  Location: [facility];  Service: General;  Laterality: N/A;    MEDICATIONS:  Previous Medications   HYDROCODONE-ACETAMINOPHEN (NORCO) 5-325 MG PER TABLET    Take 1-2 tablets by mouth every 4 (four) hours as needed for pain.   HYDROXYZINE (ATARAX) 50 MG TABLET    Take 50 mg by mouth every 6 (six) hours as needed. For  nerves    LUBIPROSTONE (AMITIZA) 24 MCG CAPSULE    Take 24 mcg by mouth daily.     QUETIAPINE (SEROQUEL) 300 MG TABLET    Take 600 mg by mouth at bedtime.     RANITIDINE (ZANTAC) 150 MG TABLET    Take 150 mg by mouth 2 (two) times daily.     SERTRALINE (ZOLOFT) 100 MG TABLET    Take 100 mg by mouth daily.     TRAZODONE (DESYREL) 150 MG TABLET    Take 150 mg by mouth at bedtime.     ZIPRASIDONE (GEODON) 80 MG CAPSULE    Take 160 mg by mouth at bedtime.     ZOLPIDEM (AMBIEN) 10 MG TABLET    Take 10 mg by mouth at bedtime.       ALLERGIES:  Allergies as of 11/18/2010  . (No Known Allergies)     FAMILY HISTORY:  History reviewed. No pertinent family history.   SOCIAL HISTORY: History   Social History  . Marital Status: Divorced    Spouse Name: N/A    Number of Children: N/A  . Years of Education: N/A   Social History Main Topics  . Smoking status: Never Smoker   . Smokeless tobacco: None  . Alcohol Use: No  . Drug Use: No  . Sexually Active: Yes    Birth Control/ Protection: None   Other Topics Concern  . None   Social History Narrative  . None  Review of Systems  10 Systems reviewed and are negative for acute change except as noted in the HPI.  Physical Exam  BP 91/70  Pulse 118  Temp(Src) 97.9 F (36.6 C) (Oral)  Resp 18  Ht 5\' 5"  (1.651 m)  Wt 240 lb (108.863 kg)  BMI 39.94 kg/m2  SpO2 98%  LMP 10/01/2010  Physical Exam  Constitutional: She is oriented to person, place, and time. She appears well-developed and well-nourished.  HENT:  Head: Normocephalic and atraumatic.  Mouth/Throat: Oropharynx is clear and moist.       Blood stains around the lips. Mucous membranes were dry.   Eyes: EOM are normal. Pupils are equal, round, and reactive to light.       Conjunctiva pale.   Neck: Neck supple.  Cardiovascular: Normal rate and regular rhythm.  Exam reveals no gallop and no friction rub.   No murmur heard. Pulmonary/Chest: Effort normal and breath  sounds normal. She has no wheezes.  Abdominal: Soft. Bowel sounds are normal. She exhibits no distension. There is no tenderness.       4 laparoscopic incisions consistent with recent cholecystectomy with scant dried blood and no surrounding erythema.     Musculoskeletal: Normal range of motion. She exhibits no edema.  Neurological: She is alert and oriented to person, place, and time. No sensory deficit.  Skin: Skin is dry. There is pallor.  Psychiatric: She has a normal mood and affect. Her behavior is normal.    ED Course  Procedures  MDM OTHER DATA REVIEWED: Nursing notes, vital signs, and past medical records reviewed. Lab results reviewed and considered Imaging results reviewed and considered  DIAGNOSTIC STUDIES: Oxygen Saturation is 100% on room air, normal by my interpretation.    LABS / RADIOLOGY: Results for orders placed during the hospital encounter of 11/18/10  CBC      Component Value Range   WBC 8.2  4.0 - 10.5 (K/uL)   RBC 2.09 (*) 3.87 - 5.11 (MIL/uL)   Hemoglobin 6.2 (*) 12.0 - 15.0 (g/dL)   HCT 16.1 (*) 09.6 - 46.0 (%)   MCV 90.0  78.0 - 100.0 (fL)   MCH 29.7  26.0 - 34.0 (pg)   MCHC 33.0  30.0 - 36.0 (g/dL)   RDW 04.5 (*) 40.9 - 15.5 (%)   Platelets 357  150 - 400 (K/uL)  DIFFERENTIAL      Component Value Range   Neutrophils Relative 69  43 - 77 (%)   Lymphocytes Relative 22  12 - 46 (%)   Monocytes Relative 8  3 - 12 (%)   Eosinophils Relative 1  0 - 5 (%)   Basophils Relative 0  0 - 1 (%)   Neutro Abs 5.6  1.7 - 7.7 (K/uL)   Lymphs Abs 1.8  0.7 - 4.0 (K/uL)   Monocytes Absolute 0.7  0.1 - 1.0 (K/uL)   Eosinophils Absolute 0.1  0.0 - 0.7 (K/uL)   Basophils Absolute 0.0  0.0 - 0.1 (K/uL)   RBC Morphology POLYCHROMASIA PRESENT    BASIC METABOLIC PANEL      Component Value Range   Sodium 136  135 - 145 (mEq/L)   Potassium 3.9  3.5 - 5.1 (mEq/L)   Chloride 105  96 - 112 (mEq/L)   CO2 22  19 - 32 (mEq/L)   Glucose, Bld 173 (*) 70 - 99 (mg/dL)    BUN 14  6 - 23 (mg/dL)   Creatinine, Ser 8.11 (*) 0.50 - 1.10 (mg/dL)  Calcium 7.5 (*) 8.4 - 10.5 (mg/dL)   GFR calc non Af Amer 48 (*) >60 (mL/min)   GFR calc Af Amer 58 (*) >60 (mL/min)  TYPE AND SCREEN      Component Value Range   ABO/RH(D) O POS     Antibody Screen NEG     Sample Expiration 11/21/2010     Unit Number [id number]     Blood Component Type RED CELLS,LR     Unit division 00     Status of Unit ALLOCATED     Transfusion Status OK TO TRANSFUSE     Crossmatch Result Compatible     Unit Number [id number]     Blood Component Type RED CELLS,LR     Unit division 00     Status of Unit ALLOCATED     Transfusion Status OK TO TRANSFUSE     Crossmatch Result Compatible    PREPARE RBC (CROSSMATCH)      Component Value Range   Order Confirmation ORDER PROCESSED BY BLOOD BANK    PROTIME-INR      Component Value Range   Prothrombin Time 15.3 (*) 11.6 - 15.2 (seconds)   INR 1.18  0.00 - 1.49   APTT      Component Value Range   aPTT 39 (*) 24 - 37 (seconds)  ABO/RH      Component Value Range   ABO/RH(D) O POS     Dg Abd Acute W/chest  11/18/2010  *RADIOLOGY REPORT*  Clinical Data: Hematemesis  ACUTE ABDOMEN SERIES (ABDOMEN 2 VIEW & CHEST 1 VIEW)  Comparison: None.  Findings: Low lung volumes.  Clear lungs.  Normal heart size.  No free intraperitoneal gas no disproportionate dilatation of bowel.  Post cholecystectomy staples.  Unremarkable soft tissues.  IMPRESSION: Nonobstructive bowel gas pattern.  No active cardiopulmonary disease.  Original Report Authenticated By: Donavan Burnet, M.D.   ED COURSE / COORDINATION OF CARE: Orders Placed This Encounter  Procedures  . DG Abd Acute W/Chest  . CBC  . Differential  . Basic metabolic panel  . Protime-INR  . APTT  . DIET NPO  . Irrigate NG tube  . Type and screen  . Prepare RBC (crossmatch)  . ABO/Rh  2:20PM- Patient reports she has not vomited since initial exam performed and feels the same as she did at that time.  Informed of treatment plan at this time and intent to call Dr. Leticia Penna. Patient is agreeable to current plan.  2:34PM- Consult complete with General surgery completed. Patient case explained and discussed. Treatment plan discussed.   MDM: 2:51 PM Pt with no further hematemasis in ED, awaiting blood products D/w Dr. Caesar Bookman who states he would expect more abdominal pain and more quickly after surgery if there was duodenal/gastric perf.  Abdomen without significant tenderness or distension.  Will call GI now.   D/w Dr. Karilyn Cota, GI - he will consult on patient today and likely EGD tomorrow. Will call hospitalist for admission now.  3:41 PM D/w Dr. Lendell Caprice, pt to be admitted to ICU.  Pt continues to mentate well, hypotensive, PRBCs are hanging, NG tube without active red blood at this time.    CRITICAL CARE NOTE: Critical care time was provided for exclusive of separately billable procedures and treating other patients.  This was necessary to treat or prevent further deterioration of the following condition(s) anemia which the patient had and/or had a high probability of suddenly developing. This involved direct bedside patient care, speaking with family members,  review of past medical records, reviewing the results of the laboratory and diagnostic studies, consulting with other physicians, as well as evaluating the effectiveness of the therapy instituted as described.  PLAN: as above  CONDITION ON ADMISSION: gaurded   MEDICATIONS GIVEN IN THE E.D.  Medications  pantoprazole (PROTONIX) 80 mg in sodium chloride 0.9 % 250 mL infusion (8 mg/hr Intravenous New Bag 11/18/10 1455)  ondansetron (ZOFRAN) injection 4 mg (4 mg Intravenous Given 11/18/10 1339)  pantoprazole (PROTONIX) 80 mg in sodium chloride 0.9 % 100 mL IVPB (80 mg Intravenous Given 11/18/10 1418)      I personally performed the services described in this documentation, which was scribed in my presence. The recorded  information has been reviewed and considered.         Karla Chick, MD 12/18/10 [phone]

## 2010-11-18 NOTE — ED Notes (Signed)
Floor unable to take report at this time.

## 2010-11-19 ENCOUNTER — Encounter (HOSPITAL_COMMUNITY): Payer: Self-pay | Admitting: Anesthesiology

## 2010-11-19 ENCOUNTER — Inpatient Hospital Stay (HOSPITAL_COMMUNITY): Payer: Medicaid Other | Admitting: Anesthesiology

## 2010-11-19 ENCOUNTER — Encounter (HOSPITAL_COMMUNITY)
Admission: EM | Disposition: A | Discharge status: Home / Self Care | Payer: Self-pay | Source: Home / Self Care | Attending: General Surgery

## 2010-11-19 ENCOUNTER — Other Ambulatory Visit: Payer: Self-pay | Admitting: General Surgery

## 2010-11-19 ENCOUNTER — Encounter (HOSPITAL_COMMUNITY): Payer: Self-pay | Admitting: *Deleted

## 2010-11-19 DIAGNOSIS — N179 Acute kidney failure, unspecified: Secondary | ICD-10-CM | POA: Diagnosis present

## 2010-11-19 HISTORY — PX: GASTRECTOMY: SHX58

## 2010-11-19 HISTORY — PX: GASTROJEJUNOSTOMY: SHX1697

## 2010-11-19 HISTORY — PX: LAPAROTOMY: SHX154

## 2010-11-19 HISTORY — PX: ESOPHAGOGASTRODUODENOSCOPY: SHX5428

## 2010-11-19 LAB — CBC
HCT: 27 % — ABNORMAL LOW (ref 36.0–46.0)
HCT: 23.5 % — ABNORMAL LOW (ref 36.0–46.0)
HCT: 29.5 % — ABNORMAL LOW (ref 36.0–46.0)
Hemoglobin: 8 g/dL — ABNORMAL LOW (ref 12.0–15.0)
MCH: 29.7 pg (ref 26.0–34.0)
MCH: 29.6 pg (ref 26.0–34.0)
MCHC: 34 g/dL (ref 30.0–36.0)
MCV: 87.1 fL (ref 78.0–100.0)
Platelets: 308 10*3/uL (ref 150–400)
Platelets: 293 10*3/uL (ref 150–400)
RDW: 16.1 % — ABNORMAL HIGH (ref 11.5–15.5)
RDW: 16.3 % — ABNORMAL HIGH (ref 11.5–15.5)
RDW: 15.7 % — ABNORMAL HIGH (ref 11.5–15.5)
WBC: 12.2 10*3/uL — ABNORMAL HIGH (ref 4.0–10.5)
WBC: 18.7 10*3/uL — ABNORMAL HIGH (ref 4.0–10.5)

## 2010-11-19 LAB — PREPARE RBC (CROSSMATCH)

## 2010-11-19 LAB — GLUCOSE, CAPILLARY
Glucose-Capillary: 154 mg/dL — ABNORMAL HIGH (ref 70–99)
Glucose-Capillary: 162 mg/dL — ABNORMAL HIGH (ref 70–99)

## 2010-11-19 LAB — BASIC METABOLIC PANEL
CO2: 20 mEq/L (ref 19–32)
Calcium: 7.1 mg/dL — ABNORMAL LOW (ref 8.4–10.5)
Chloride: 108 mEq/L (ref 96–112)
Creatinine, Ser: 1.4 mg/dL — ABNORMAL HIGH (ref 0.50–1.10)
Glucose, Bld: 113 mg/dL — ABNORMAL HIGH (ref 70–99)

## 2010-11-19 LAB — HEMOGLOBIN AND HEMATOCRIT, BLOOD
HCT: 26 % — ABNORMAL LOW (ref 36.0–46.0)
HCT: 30.5 % — ABNORMAL LOW (ref 36.0–46.0)
Hemoglobin: 10.5 g/dL — ABNORMAL LOW (ref 12.0–15.0)
Hemoglobin: 8.9 g/dL — ABNORMAL LOW (ref 12.0–15.0)

## 2010-11-19 LAB — HEMOGLOBIN A1C: Mean Plasma Glucose: 85 mg/dL (ref <117)

## 2010-11-19 SURGERY — EGD (ESOPHAGOGASTRODUODENOSCOPY)
Anesthesia: Moderate Sedation

## 2010-11-19 SURGERY — LAPAROTOMY, EXPLORATORY
Anesthesia: General | Wound class: Contaminated

## 2010-11-19 MED ORDER — GLYCOPYRROLATE 0.2 MG/ML IJ SOLN
INTRAMUSCULAR | Status: DC | PRN
  Administered 2010-11-19 (×2): 0.2 mg via INTRAVENOUS

## 2010-11-19 MED ORDER — DEXTROSE 5 % IV SOLN
INTRAVENOUS | Status: AC
  Filled 2010-11-19: qty 2

## 2010-11-19 MED ORDER — POVIDONE-IODINE 10 % EX OINT
TOPICAL_OINTMENT | CUTANEOUS | Status: AC
  Filled 2010-11-19: qty 2

## 2010-11-19 MED ORDER — SUCCINYLCHOLINE CHLORIDE 20 MG/ML IJ SOLN
INTRAMUSCULAR | Status: DC | PRN
  Administered 2010-11-19: 110 mg via INTRAVENOUS

## 2010-11-19 MED ORDER — ONDANSETRON HCL 4 MG/2ML IJ SOLN: 4.0000 mg | Freq: Once | INTRAMUSCULAR | Status: DC | PRN

## 2010-11-19 MED ORDER — MEPERIDINE HCL 25 MG/ML IJ SOLN
INTRAMUSCULAR | Status: DC | PRN
  Administered 2010-11-19 (×2): 25 mg via INTRAVENOUS

## 2010-11-19 MED ORDER — SUCCINYLCHOLINE CHLORIDE 20 MG/ML IJ SOLN
INTRAMUSCULAR | Status: AC
  Filled 2010-11-19: qty 1

## 2010-11-19 MED ORDER — MIDAZOLAM HCL 2 MG/2ML IJ SOLN
INTRAMUSCULAR | Status: AC
  Administered 2010-11-19: 2 mg via INTRAVENOUS
  Filled 2010-11-19: qty 2

## 2010-11-19 MED ORDER — LACTATED RINGERS IV SOLN
INTRAVENOUS | Status: DC
Start: 2010-11-19 — End: 2010-11-19

## 2010-11-19 MED ORDER — PROPOFOL 10 MG/ML IV EMUL
INTRAVENOUS | Status: DC | PRN
  Administered 2010-11-19: 110 mg via INTRAVENOUS

## 2010-11-19 MED ORDER — DEXTROSE 5 % IV SOLN
2.0000 g | INTRAVENOUS | Status: AC
  Administered 2010-11-19: 2 g via INTRAVENOUS
  Filled 2010-11-19: qty 2

## 2010-11-19 MED ORDER — SODIUM CHLORIDE 0.9 % IV SOLN
Freq: Once | INTRAVENOUS | Status: AC
  Administered 2010-11-19: 16:00:00 via INTRAVENOUS

## 2010-11-19 MED ORDER — LACTATED RINGERS IV SOLN
INTRAVENOUS | Status: DC | PRN
  Administered 2010-11-19 (×3): via INTRAVENOUS

## 2010-11-19 MED ORDER — ROCURONIUM BROMIDE 50 MG/5ML IV SOLN
INTRAVENOUS | Status: AC
  Filled 2010-11-19: qty 1

## 2010-11-19 MED ORDER — SODIUM CHLORIDE 0.9 % IR SOLN
Status: DC | PRN
  Administered 2010-11-19: 1000 mL

## 2010-11-19 MED ORDER — ROCURONIUM BROMIDE 100 MG/10ML IV SOLN
INTRAVENOUS | Status: DC | PRN
  Administered 2010-11-19: 5 mg via INTRAVENOUS
  Administered 2010-11-19 (×2): 10 mg via INTRAVENOUS
  Administered 2010-11-19: 5 mg via INTRAVENOUS
  Administered 2010-11-19: 20 mg via INTRAVENOUS
  Administered 2010-11-19: 10 mg via INTRAVENOUS

## 2010-11-19 MED ORDER — SODIUM CHLORIDE 0.9 % IJ SOLN
INTRAMUSCULAR | Status: AC
  Filled 2010-11-19: qty 20

## 2010-11-19 MED ORDER — POVIDONE-IODINE 10 % OINT PACKET
TOPICAL_OINTMENT | CUTANEOUS | Status: DC | PRN
  Administered 2010-11-19: 1 via TOPICAL

## 2010-11-19 MED ORDER — MIDAZOLAM HCL 5 MG/5ML IJ SOLN
INTRAMUSCULAR | Status: DC | PRN
  Administered 2010-11-19 (×5): 2 mg via INTRAVENOUS

## 2010-11-19 MED ORDER — LACTATED RINGERS IV SOLN
INTRAVENOUS | Status: DC
  Administered 2010-11-20: 02:00:00 via INTRAVENOUS
  Administered 2010-11-21: 1000 mL via INTRAVENOUS
  Administered 2010-11-22: via INTRAVENOUS
  Administered 2010-11-22: 1000 mL via INTRAVENOUS

## 2010-11-19 MED ORDER — SUFENTANIL CITRATE 50 MCG/ML IV SOLN
INTRAVENOUS | Status: DC | PRN
  Administered 2010-11-19 (×2): 10 ug via INTRAVENOUS
  Administered 2010-11-19: 5 ug via INTRAVENOUS
  Administered 2010-11-19 (×2): 10 ug via INTRAVENOUS
  Administered 2010-11-19: 15 ug via INTRAVENOUS
  Administered 2010-11-19: 10 ug via INTRAVENOUS

## 2010-11-19 MED ORDER — EPINEPHRINE HCL 0.1 MG/ML IJ SOLN
INTRAMUSCULAR | Status: AC
  Filled 2010-11-19: qty 10

## 2010-11-19 MED ORDER — HYDROMORPHONE HCL 1 MG/ML IJ SOLN: 1.0000 mg | INTRAMUSCULAR | Status: DC | PRN

## 2010-11-19 MED ORDER — MIDAZOLAM HCL 2 MG/2ML IJ SOLN
1.0000 mg | INTRAMUSCULAR | Status: DC | PRN
  Administered 2010-11-19: 2 mg via INTRAVENOUS

## 2010-11-19 MED ORDER — HYDROMORPHONE HCL 1 MG/ML IJ SOLN
1.0000 mg | INTRAMUSCULAR | Status: DC | PRN
  Administered 2010-11-19 – 2010-11-20 (×2): 1 mg via INTRAVENOUS
  Administered 2010-11-20 – 2010-11-22 (×8): 2 mg via INTRAVENOUS
  Administered 2010-11-23: 1 mg via INTRAVENOUS
  Administered 2010-11-23 (×2): 2 mg via INTRAVENOUS
  Administered 2010-11-24 – 2010-11-25 (×4): 1 mg via INTRAVENOUS
  Filled 2010-11-19: qty 1
  Filled 2010-11-19 (×4): qty 2
  Filled 2010-11-19 (×3): qty 1
  Filled 2010-11-19 (×4): qty 2
  Filled 2010-11-19 (×2): qty 1
  Filled 2010-11-19: qty 2
  Filled 2010-11-19: qty 1
  Filled 2010-11-19: qty 2

## 2010-11-19 MED ORDER — SODIUM CHLORIDE 0.9 % IJ SOLN
INTRAMUSCULAR | Status: AC
  Filled 2010-11-19: qty 10

## 2010-11-19 MED ORDER — SODIUM CHLORIDE 0.9 % IV SOLN
INTRAVENOUS | Status: DC | PRN
  Administered 2010-11-19 (×2): via INTRAVENOUS

## 2010-11-19 MED ORDER — NEOSTIGMINE METHYLSULFATE 1 MG/ML IJ SOLN
INTRAMUSCULAR | Status: DC | PRN
  Administered 2010-11-19 (×2): 1.25 mg via INTRAMUSCULAR

## 2010-11-19 MED ORDER — PHENYLEPHRINE HCL 10 MG/ML IJ SOLN
INTRAMUSCULAR | Status: AC
  Filled 2010-11-19: qty 1

## 2010-11-19 MED ORDER — MIDAZOLAM HCL 5 MG/5ML IJ SOLN
INTRAMUSCULAR | Status: AC
  Filled 2010-11-19: qty 10

## 2010-11-19 MED ORDER — PANTOPRAZOLE SODIUM 40 MG IV SOLR
INTRAVENOUS | Status: AC
  Filled 2010-11-19: qty 80

## 2010-11-19 MED ORDER — SODIUM CHLORIDE 0.9 % IV SOLN
250.0000 mg | Freq: Once | INTRAVENOUS | Status: AC
  Administered 2010-11-19: 250 mg via INTRAVENOUS
  Filled 2010-11-19: qty 250

## 2010-11-19 MED ORDER — SUFENTANIL CITRATE 50 MCG/ML IV SOLN
INTRAVENOUS | Status: AC
  Filled 2010-11-19: qty 1

## 2010-11-19 MED ORDER — METOPROLOL TARTRATE 1 MG/ML IV SOLN
INTRAVENOUS | Status: DC | PRN
  Administered 2010-11-19: 5 mg via INTRAVENOUS

## 2010-11-19 MED ORDER — ERYTHROMYCIN LACTOBIONATE 500 MG IV SOLR
INTRAVENOUS | Status: AC
  Filled 2010-11-19: qty 500

## 2010-11-19 MED ORDER — PROPOFOL 10 MG/ML IV EMUL
INTRAVENOUS | Status: AC
  Filled 2010-11-19: qty 20

## 2010-11-19 MED ORDER — METOPROLOL TARTRATE 1 MG/ML IV SOLN
INTRAVENOUS | Status: AC
  Filled 2010-11-19: qty 5

## 2010-11-19 MED ORDER — ENOXAPARIN SODIUM 40 MG/0.4ML ~~LOC~~ SOLN
40.0000 mg | SUBCUTANEOUS | Status: DC
  Administered 2010-11-19 – 2010-11-25 (×7): 40 mg via SUBCUTANEOUS
  Filled 2010-11-19 (×7): qty 0.4

## 2010-11-19 MED ORDER — SODIUM CHLORIDE 0.9 % IV SOLN
INTRAVENOUS | Status: DC
  Filled 2010-11-19 (×14): qty 1000

## 2010-11-19 MED ORDER — MEPERIDINE HCL 50 MG/ML IJ SOLN
INTRAMUSCULAR | Status: AC
  Filled 2010-11-19: qty 1

## 2010-11-19 SURGICAL SUPPLY — 76 items
APPLIER CLIP 11 MED OPEN (CLIP)
APPLIER CLIP 13 LRG OPEN (CLIP) ×2
APR CLP LRG 13 20 CLIP (CLIP) ×1
APR CLP MED 11 20 MLT OPN (CLIP)
BAG HAMPER (MISCELLANEOUS) ×2 IMPLANT
BARRIER SKIN 2 3/4 (OSTOMY) IMPLANT
BARRIER SKIN OD2.25 2 3/4 FLNG (OSTOMY) IMPLANT
BRR SKN FLT 2.75X2.25 2 PC (OSTOMY)
CLAMP POUCH DRAINAGE QUIET (OSTOMY) IMPLANT
CLIP APPLIE 11 MED OPEN (CLIP) IMPLANT
CLIP APPLIE 13 LRG OPEN (CLIP) IMPLANT
CLOTH BEACON ORANGE TIMEOUT ST (SAFETY) ×2 IMPLANT
COVER LIGHT HANDLE STERIS (MISCELLANEOUS) ×4 IMPLANT
DRAIN PENROSE 18X1/2 LTX STRL (DRAIN) ×2 IMPLANT
DRAPE WARM FLUID 44X44 (DRAPE) ×2 IMPLANT
DURAPREP 26ML APPLICATOR (WOUND CARE) ×2 IMPLANT
ELECT BLADE 6 FLAT ULTRCLN (ELECTRODE) ×2 IMPLANT
ELECT REM PT RETURN 9FT ADLT (ELECTROSURGICAL) ×2
ELECTRODE REM PT RTRN 9FT ADLT (ELECTROSURGICAL) ×1 IMPLANT
EVACUATOR DRAINAGE 10X20 100CC (DRAIN) ×1 IMPLANT
EVACUATOR SILICONE 100CC (DRAIN) ×2
GLOVE BIOGEL PI IND STRL 7.5 (GLOVE) ×1 IMPLANT
GLOVE BIOGEL PI INDICATOR 7.5 (GLOVE) ×3
GLOVE ECLIPSE 6.5 STRL STRAW (GLOVE) ×1 IMPLANT
GLOVE ECLIPSE 7.0 STRL STRAW (GLOVE) ×4 IMPLANT
GLOVE INDICATOR 7.0 STRL GRN (GLOVE) ×1 IMPLANT
GOWN BRE IMP SLV AUR XL STRL (GOWN DISPOSABLE) ×6 IMPLANT
HARMONIC SHEARS 14CM COAG (MISCELLANEOUS) IMPLANT
INST SET MAJOR GENERAL (KITS) ×2 IMPLANT
KIT ROOM TURNOVER APOR (KITS) ×2 IMPLANT
LIGASURE IMPACT 36 18CM CVD LR (INSTRUMENTS) ×1 IMPLANT
MANIFOLD NEPTUNE II (INSTRUMENTS) ×2 IMPLANT
NS IRRIG 1000ML POUR BTL (IV SOLUTION) ×3 IMPLANT
PACK ABDOMINAL MAJOR (CUSTOM PROCEDURE TRAY) ×2 IMPLANT
PAD ABD 5X9 TENDERSORB (GAUZE/BANDAGES/DRESSINGS) ×2 IMPLANT
PAD ARMBOARD 7.5X6 YLW CONV (MISCELLANEOUS) ×2 IMPLANT
POUCH OSTOMY 2 3/4  H 3804 (WOUND CARE)
POUCH OSTOMY 2 3/4 H 3804 (WOUND CARE)
POUCH OSTOMY 2 PC DRNBL 2.75 (WOUND CARE) IMPLANT
RELOAD LINEAR CUT PROX 55 BLUE (ENDOMECHANICALS) IMPLANT
RELOAD PROXIMATE 75MM BLUE (ENDOMECHANICALS) ×8 IMPLANT
RELOAD STAPLE 55 3.8 BLU REG (ENDOMECHANICALS) IMPLANT
RELOAD STAPLE 75 3.8 BLU REG (ENDOMECHANICALS) IMPLANT
RETRACTOR WND ALEXIS 25 LRG (MISCELLANEOUS) IMPLANT
RETRACTOR WOUND ALXS 34CM XLRG (MISCELLANEOUS) IMPLANT
RTRCTR WOUND ALEXIS 25CM LRG (MISCELLANEOUS) ×2
RTRCTR WOUND ALEXIS 34CM XLRG (MISCELLANEOUS)
SEALER TISSUE G2 CVD JAW 35 (ENDOMECHANICALS) IMPLANT
SEALER TISSUE G2 CVD JAW 45CM (ENDOMECHANICALS) ×1
SET BASIN LINEN APH (SET/KITS/TRAYS/PACK) ×2 IMPLANT
SHEARS HARMONIC 23CM COAG (MISCELLANEOUS) IMPLANT
SPONGE DRAIN TRACH 4X4 STRL 2S (GAUZE/BANDAGES/DRESSINGS) ×2 IMPLANT
SPONGE GAUZE 4X4 12PLY (GAUZE/BANDAGES/DRESSINGS) ×1 IMPLANT
SPONGE INTESTINAL PEANUT (DISPOSABLE) ×1 IMPLANT
SPONGE LAP 18X18 X RAY DECT (DISPOSABLE) ×1 IMPLANT
STAPLER AUT SUT LDS 15W (STAPLE) IMPLANT
STAPLER GUN LINEAR PROX 60 (STAPLE) IMPLANT
STAPLER PROXIMATE 55 BLUE (STAPLE) IMPLANT
STAPLER PROXIMATE 75MM BLUE (STAPLE) ×1 IMPLANT
STAPLER PROXIMATE 90 DIAL (STAPLE) IMPLANT
STAPLER VISISTAT (STAPLE) ×2 IMPLANT
SUCTION POOLE TIP (SUCTIONS) ×2 IMPLANT
SUT CHROMIC 0 SH (SUTURE) IMPLANT
SUT CHROMIC 2 0 SH (SUTURE) IMPLANT
SUT ETHILON 3 0 FSL (SUTURE) ×1 IMPLANT
SUT NOVA NAB GS-26 0 60 (SUTURE) ×3 IMPLANT
SUT PROLENE 2 0 SH 30 (SUTURE) ×1 IMPLANT
SUT PROLENE NAB BLUE 3-0 30IN (SUTURE) ×2 IMPLANT
SUT SILK 2 0 (SUTURE) ×2
SUT SILK 2-0 18XBRD TIE 12 (SUTURE) ×1 IMPLANT
SUT SILK 3 0 SH CR/8 (SUTURE) ×3 IMPLANT
SYR BULB IRRIGATION 50ML (SYRINGE) ×2 IMPLANT
TAPE CLOTH SURG 4X10 WHT LF (GAUZE/BANDAGES/DRESSINGS) ×2 IMPLANT
TOWEL BLUE STERILE X RAY DET (MISCELLANEOUS) ×2 IMPLANT
TOWEL OR 17X26 4PK STRL BLUE (TOWEL DISPOSABLE) ×2 IMPLANT
TRAY FOLEY CATH 14FR (SET/KITS/TRAYS/PACK) ×2 IMPLANT

## 2010-11-19 NOTE — Anesthesia Postprocedure Evaluation (Signed)
  Anesthesia Post-op Note  Patient: Karla Anderson  Procedure(s) Performed:  EXPLORATORY LAPAROTOMY; GASTRECTOMY - Partial Gastrectomy; GASTROJEJUNOSTOMY - Roux-En-Y  Patient Location: PACU  Anesthesia Type: General  Level of Consciousness: awake and alert   Airway and Oxygen Therapy: Patient Spontanous Breathing and Patient connected to face mask oxygen  Post-op Pain: none  Post-op Assessment: Post-op Vital signs reviewed, Patient's Cardiovascular Status Stable and Respiratory Function Stable  Post-op Vital Signs: Reviewed and stable  Complications: No apparent anesthesia complications

## 2010-11-19 NOTE — Progress Notes (Signed)
Patient brought to recovery after endo per dr Jayme Cloud request. Monitored as a post endo recovery patient. Then patient ordered to go to surgery per dr ziegler and dr Karilyn Cota. Multiple issues addressed attempted to place patient back in pre procedure to perform surgical pre checking and orders. Very difficult charting never had an occurrence of this nature. Patient tolerated well. Vital were stable upon release to surgery.family was allowed  back to see patient.patient alert and oriented on signing consent for surgery.

## 2010-11-19 NOTE — Brief Op Note (Signed)
11/18/2010 - 11/19/2010  8:28 AM  PATIENT:  Karla Anderson  40 y.o. female  PRE-OPERATIVE DIAGNOSIS:  UGI Bleed  POST-OPERATIVE DIAGNOSIS:  Mild Reflux Bulbar Ulcer  PROCEDURE:  Procedure(s): ESOPHAGOGASTRODUODENOSCOPY (EGD)  SURGEON:  Surgeon(s): Malissa Hippo, MD  Brief EGD note. Erosion at GE junction. Stomach containing large fresh clot. Large circumferential circumferential ulcer Wal[place] all of the bulb with visible vessel and some fresh blood around it. Attempted to inject with dilute diluted epinephrine for using gold probe. Started to bleed briskly; did not attempt or that there was a risk of duodenal perforation. Patient will need surgical intervention.

## 2010-11-19 NOTE — Op Note (Signed)
Karla Anderson, Karla Anderson             ACCOUNT NO.:  [id number]  MEDICAL RECORD NO.:  [id number]  LOCATION:  IC08                          FACILITY:  [facility]  PHYSICIAN:  Lionel December, M.D.    DATE OF BIRTH:  10-26-1970  DATE OF PROCEDURE:  11/19/2010 DATE OF DISCHARGE:                              OPERATIVE REPORT   PROCEDURE:  Esophagogastroduodenoscopy.  INDICATION:  Karla Anderson is a 40 year old Caucasian female with upper GI bleed.  She presented yesterday with a hemoglobin of less than 7 g and hematemesis.  She has been on ibuprofen high-dose until about 12 days ago.  She has received 2 units of PRBCs and her hemoglobin is up to 9.2. She is undergoing EGD with therapeutic intention.  Procedure risks were reviewed with the patient and informed consent was obtained.  MEDICATIONS FOR CONSCIOUS SEDATION: 1. Cetacaine spray for pharyngeal topical anesthesia. 2. Demerol 50 mg IV. 3. Versed 10 mg IV. 4. Metoprolol 5 mg IV.  FINDINGS:  Procedure performed in endoscopy suite.  The patient's vital signs and O2 sats were monitored during the procedure.  Her blood pressure was normal, but she had a resting sinus tachycardia with a rate of around 130-135.  She was given 5 mg of metoprolol IV and her heart rate dropped to 100.  Her pressure remained normal throughout.  The patient was placed in left lateral recumbent position and Pentax videoscope was passed through oropharynx without any difficulty into esophagus.  Esophagus.  Mucosa of the esophagus was normal.  She has focal erythema just proximal to GE junction and single erosion at GE junction.  Stomach.  There was coffee-ground material and large clots.  Examination of gastric mucosa was somewhat limited, but no bleeding lesion was identified.  Pyloric channel was patent.  Bulb.  Mucosa of the proximal bulb was normal.  Distally, there was narrowing and a circumferential ulcer which involved 360 degrees.  It had some fresh blood and  protruding blood vessel.  I did not attempt to pass the scope into the second part of the duodenum because there was narrowing to the angle of the duodenum.  I tried to inject 1:20,000 epinephrine into the area surrounding this vessel before attempting coagulation with Gold probe.  However, as I was injecting it started to bleed briskly.  I could not see this area very well.  Therefore, endoscope was withdrawn.  I was also concerned that blind therapy may result in the perforation.  Endoscope was withdrawn.  The patient tolerated the procedure well.  FINAL DIAGNOSES: 1. Large circumferential bulbar ulcer with protruding vessel and stigmata of bleed. Injection therapy accelerated the bleeding. 2. This bleeding vessel cannot be controlled with endoscopic therapy.  RECOMMENDATIONS: 1. We will consult Dr. Leticia Penna as she will need surgical intervention. 2. Please note that Dr. Leticia Penna knows the patient as she had a     cholecystectomy on November 09, 2010.          ______________________________ Lionel December, M.D.     NR/MEDQ  D:  11/19/2010  T:  11/19/2010  Job:  [id number]

## 2010-11-19 NOTE — Preoperative (Signed)
Beta Blockers   Reason not to administer Beta Blockers:Not Applicable.  2 Grams Mefoxitin IVPB Pre op

## 2010-11-19 NOTE — Anesthesia Preprocedure Evaluation (Signed)
Anesthesia Evaluation  Name, MR# and DOB Patient awake  General Assessment Comment  Reviewed: Allergy & Precautions, H&P , NPO status , Patient's Chart, lab work & pertinent test results  History of Anesthesia Complications Negative for: history of anesthetic complications  Airway Mallampati: I TM Distance: >3 FB Neck ROM: Full    Dental  (+) Missing and Chipped,    Pulmonary    pulmonary exam normalPulmonary Exam Normal     Cardiovascular - dysrhythmias (sinus tachycardia) I+ Valvular Problems/Murmurs (probably mitral valve prolapse, difficult to hear) Regular Tachycardia+ Systolic murmurs- Diastolic murmurs, - Friction Rub, - Carotid Bruit, - Peripheral Edema and - Systolic Click    Neuro/Psych    (+) PSYCHIATRIC DISORDERS, Anxiety, Depression, Bipolar Disorder,  Negative Neurological ROS    GI/Hepatic/Renal negative GI ROS  negative Liver ROS  negative Renal ROS  PUD,       Endo/Other  (+)   Morbid obesity  Abdominal (+) obese,  Abdomen: soft. Bowel sounds: normal.  Musculoskeletal negative musculoskeletal ROS (+)   Hematology negative hematology ROS (+)   Peds  Reproductive/Obstetrics negative OB ROS    Anesthesia Other Findings             Anesthesia Physical Anesthesia Plan  ASA: II and Emergent  Anesthesia Plan: General   Post-op Pain Management:    Induction: Intravenous, Rapid sequence and Cricoid pressure planned  Airway Management Planned: Oral ETT  Additional Equipment:   Intra-op Plan:   Post-operative Plan: Extubation in OR  Informed Consent: I have reviewed the patients History and Physical, chart, labs and discussed the procedure including the risks, benefits and alternatives for the proposed anesthesia with the patient or authorized representative who has indicated his/her understanding and acceptance.     Plan Discussed with:   Anesthesia Plan Comments: (Tranfuse  PRBC prior to induction.)        Anesthesia Quick Evaluation

## 2010-11-19 NOTE — Op Note (Signed)
Patient:  Karla Anderson  DOB:  [birth year]  MRN:  [id number]   Preop Diagnosis:  Refractory gastric bleed  Postop Diagnosis:  The same  Procedure:  Exploratory laparotomy, antrectomy with Roux-en-Y gastrojejunostomy, intra-abdominal drain placement and closure of duodenal stump.  Surgeon:  Dr. Tilford Pillar  Asst.: Dr. Franky Macho  Anes:  GETA  Indications:  Patient is a 40 year old female return to [facility] with hematemesis. Upper endoscopy by Dr. Karilyn Cota demonstrated a large gastric/duodenal bulb ulcer with visible vessel and active bleeding. At endoscopic therapy was not feasible the risks benefits and alternatives of a gastric resection were discussed with the patient. Risks including but not limited to risk of bleeding infection and anastomotic leak as well as intra-abdominal cardiac and pulmonary events were discussed. She was consented for emergent exploratory laparotomy partial gastrectomy.  Procedure note:  Patient was taken to the OR was placed in a supine position on the OR table. General anesthetic was administered and then the patient was endotracheally intubated by anesthesia. Her abdomen was prepped with DuraPrep solution after a Foley catheter had been successfully placed in standard sterile fashion by the operative room staff. Drapes are placed in standard fashion. An upper midline incision was created with a 10 blade scalpel. Additional dissection down through subcuticular tissues carried out using electrocautery including the division of the anterior bowel wall fascia. The peritoneum was grasped with hemostats and lifted anteriorly and then opened sharply with Metzenbaum scissors. At this point the peritoneum was opened superior and inferiorly with electrocautery. Multiple loops of blood filled small intestine were identified. The transverse colon was elevated and the gastrocolic ligament was divided using an EnSeal bipolar device. This time I continued the  dissection with bipolar towards the distal stomach along the greater curvature. The pylorus was identified a window was created at this point along the lesser curvature with the bipolar device. A half inch Penrose drain was placed to help retract the stomach up into the field. A bleeding vessel was encountered which was ligated with a 3-0 silk in a figure-of-eight fashion. Continued dissection it was noted that this area was adherent over the porta hepatis. Her full right angle dissection was carried out to free the first portion of the duodenum of the portal triad. Additionally I kocherized the C-loop of the duodenum begin noted that the first portion of the duodenum was adherent in this area. Attempts to carefully dissect the duodenal bulb and distal pylorus off of this area I did enter into the duodenum. This did demonstrate the ulcer along the posterior aspect of the first portion of the duodenum. The previously ligated vessel was noted to be coursing through this area as well. This to get enough exposure to divide the remainder of the pylorus off of the first portion of the duodenum. At this point the stomach was distally free and I opted to divide the distal stomach with a 75 GIA standard stapler. A second reload was required to complete that this division at this point the stomach was free in place on the back table as a permanent specimen.  At this point did return my attention back to the duodenal stump. Not feeling I had any additional room to mobilize distally to the second portion of the duodenum we decided the best option would be to close the duodenal stump. This was completed using a 3-0 Prolene in a running continuous fashion. The posterior wall was noted to be somewhat friable but I was able to  successfully close the duodenal stump. Additionally I was able to irritate the suture line by rotating the stump posteriorly and securing it with pexing 3-0 silks. A tong of omentum was eventually mobilized  and additionally placed in this area for additional dressing and reinforcement. A lap sponge was placed in this position and attention was turned to creation of the Roux-en-Y.  A retro colic defect was created in the transverse mesocolon. A loop of small intestine was identified easily reach the divided stomach. The small intestine was divided with a reload of the 75 GIA stapler and the distal end was brought through the mesenteric defect. This was placed along the posterior wall of the stomach, pexed with a 3-0 silk and both ends were opened with electrocautery. A reload of the GIA stapler was utilized to create the stapled anastomoses along the posterior aspect of the stomach. The remaining enterotomy was closed with simple interrupted 3-0 silks. At this point I turned my attention to creation of the biliary limb anastomoses. Biliary limb and distal small bowel by side to side approximation and pexed with a 3-0 silk. Enterotomies were created on both sides with electrocautery and a reload of the GIA stapler was utilized to create the stapled anastomoses. The final enterotomy was closed with 3-0 silk sutures in simple interrupted fashion. Apex of the anastomoses was also secured with a 3-0 silk. At this point a return my attention to the duodenal stump.  The lap sponge was removed. There is no evidence of any biliary drainage. A 10 JP drain was placed through the right lateral abdominal wall and position adjacent to the duodenal stump. The abdominal cavity was irrigated with copious amount of sterile saline. Then attention was turned to closure. The drain was secured to the skin with a 3-0 nylon. The fascia was reapproximated with 2 separate oh looped Novafil sutures. These were secured to each other. The subcuticular tissue was irrigated. Skin staples were utilized to reapproximate the skin edges. Skin was washed and dried a moist dry towel. Betadine ointment was placed on the incision. Sterile dressings were  placed. The drapes were removed, the dressings were secured, and the patient was transferred back to the postanesthetic care unit in stable condition. At the conclusion of the procedure all instrument, sponge, needle counts were correct. The patient tolerated the procedure extremely well.   Complications:   None  EBL:   150 mL Intra-Op  Specimen:   Stomach(distal)

## 2010-11-19 NOTE — Anesthesia Preprocedure Evaluation (Deleted)
Anesthesia Evaluation  Name, MR# and DOB Patient awake  General Assessment Comment  Reviewed: Allergy & Precautions, H&P , NPO status , Patient's Chart, lab work & pertinent test results  History of Anesthesia Complications Negative for: history of anesthetic complications  Airway Mallampati: I TM Distance: >3 FB Neck ROM: Full    Dental  (+) Missing and Chipped,    Pulmonary    pulmonary exam normalPulmonary Exam Normal     Cardiovascular - dysrhythmias (sinus tachycardia) I+ Valvular Problems/Murmurs (probably mitral valve prolapse, difficult to hear) Regular Tachycardia+ Systolic murmurs- Diastolic murmurs, - Friction Rub, - Carotid Bruit, - Peripheral Edema and - Systolic Click    Neuro/Psych    (+) PSYCHIATRIC DISORDERS, Anxiety, Depression,  Negative Neurological ROS    GI/Hepatic/Renal negative GI ROS  negative Liver ROS  negative Renal ROS        Endo/Other  (+)   Morbid obesity  Abdominal (+) obese,  Abdomen: soft. Bowel sounds: normal.  Musculoskeletal negative musculoskeletal ROS (+)   Hematology negative hematology ROS (+)   Peds  Reproductive/Obstetrics negative OB ROS    Anesthesia Other Findings             Anesthesia Physical Anesthesia Plan Anesthesia Quick Evaluation

## 2010-11-19 NOTE — Progress Notes (Signed)
Emptied 80cc from jp drain, dr Nelva Nay of patient h&h no order received. Patient transported to room voa bed. Reported tp pam tate rn

## 2010-11-19 NOTE — Progress Notes (Signed)
Events noted for evidence of a large bulbar bleeding ulcer per EGD by Dr. Karilyn Cota. She is now status post exploratory laparotomy, antrectomy, Roux-en Y. gastro-jejunostomy, intra-abdominal drain placement, and closure of duodenal stump, performed by Dr. Leticia Penna and Dr. Lovell Sheehan. Chronically, she has bipolar disorder but no history of coronary artery disease, COPD, hypertension, or diabetes mellitus. Dr. Leticia Penna was gracious enough to take over total care of the patient and, therefore, the patient will be transferred to his service. If our services are needed, we will be happy to consult.

## 2010-11-19 NOTE — Consult Note (Signed)
Reason for Consult: Upper GI bleed Referring Physician: Dr. Daivd Council is an 40 y.o. female.  HPI: Patient is well-known to me after recent cholecystectomy for gallstones and biliary symptomatology. She actually recovered well from her gallbladder surgery with no significant complaints of pain. Apparently over the last several days she's had increasing episodes of nausea and several episodes of bloody emesis. She's also had some lightheadedness over the last 24 hours and therefore presented to the emergency department. Workup in the emergency department demonstrated high suspicion for upper GI bleed. Her exam was reported otherwise to be relatively benign by the emergency room physician and she was admitted for evaluation by gastroenterology. Due to her profound anemia she was transfused several units of packed red blood cells. She did undergo endoscopy which demonstrated a circumferential ulcer in the bulb with evidence of significant bleeding within the stomach. Due to the appearance of the ulceration endoscopic treatment and therapy was not available or pursued.  Patient has no significant implants in the PACU. She does have some thirst. She denies any abdominal pain. No current lightheadedness. No chest pain or shortness of breath.  Past Medical History  Diagnosis Date  . Tachycardia   . Anxiety   . Depression   . Heart murmur   . Back pain   . Bipolar 1 disorder   . Arthritis     bil knee    Past Surgical History  Procedure Date  . Cesarean section   . Carpel tunn Carpel tunnel sx  . Tubal ligation   . Tubal ligation reversal   . Cholecystectomy 11/09/2010    Procedure: LAPAROSCOPIC CHOLECYSTECTOMY;  Surgeon: Fabio Bering;  Location: [facility];  Service: General;  Laterality: N/A;    Family History  Problem Relation Age of Onset  . Stroke Father     Died recently, Age 28  . Heart failure Mother   . Diabetes Sister   . Pneumonia Father   . Coronary artery  disease Father   . Parkinsonism Mother     Social History:  reports that she has never smoked. She does not have any smokeless tobacco history on file. She reports that she does not drink alcohol or use illicit drugs.  Allergies: No Known Allergies  Medications:  Scheduled:   . cefOXitin  2 g Intravenous 60 min Pre-Op  . diphenhydrAMINE  25 mg Intravenous Once  . EPINEPHrine      . erythromycin  250 mg Intravenous Once  . insulin aspart  0-5 Units Subcutaneous QHS  . insulin aspart  0-9 Units Subcutaneous Q4H  . meperidine      . metoprolol      . midazolam      . midazolam      . ondansetron  4 mg Intravenous Once  . pantoprazole (PROTONIX) IV  80 mg Intravenous Once  . phenylephrine      . propofol      . sodium chloride      . sodium chloride      . sodium chloride      . sodium chloride      . succinylcholine      . SUFentanil       Continuous:   . lactated ringers    . pantoprozole (PROTONIX) infusion 8 mg/hr (11/19/10 0625)  . sodium chloride 0.9 % 1,000 mL with potassium chloride 10 mEq infusion    . DISCONTD: sodium chloride 200 mL/hr at 11/19/10 0344   ZOX:[id number], ondansetron (  ZOFRAN) IV, ondansetron, promethazine, promethazine, DISCONTD: meperidine, DISCONTD: metoprolol, DISCONTD: midazolam  Results for orders placed during the hospital encounter of 11/18/10 (from the past 48 hour(s))  CBC     Status: Abnormal   Collection Time   11/18/10 12:13 PM      Component Value Range Comment   WBC 8.2  4.0 - 10.5 (K/uL)    RBC 2.09 (*) 3.87 - 5.11 (MIL/uL)    Hemoglobin 6.2 (*) 12.0 - 15.0 (g/dL)    HCT 16.1 (*) 09.6 - 46.0 (%)    MCV 90.0  78.0 - 100.0 (fL)    MCH 29.7  26.0 - 34.0 (pg)    MCHC 33.0  30.0 - 36.0 (g/dL)    RDW 04.5 (*) 40.9 - 15.5 (%)    Platelets 357  150 - 400 (K/uL)   DIFFERENTIAL     Status: Normal   Collection Time   11/18/10 12:13 PM      Component Value Range Comment   Neutrophils Relative 69  43 - 77 (%)    Lymphocytes Relative 22   12 - 46 (%)    Monocytes Relative 8  3 - 12 (%)    Eosinophils Relative 1  0 - 5 (%)    Basophils Relative 0  0 - 1 (%)    Neutro Abs 5.6  1.7 - 7.7 (K/uL)    Lymphs Abs 1.8  0.7 - 4.0 (K/uL)    Monocytes Absolute 0.7  0.1 - 1.0 (K/uL)    Eosinophils Absolute 0.1  0.0 - 0.7 (K/uL)    Basophils Absolute 0.0  0.0 - 0.1 (K/uL)    RBC Morphology POLYCHROMASIA PRESENT     BASIC METABOLIC PANEL     Status: Abnormal   Collection Time   11/18/10 12:13 PM      Component Value Range Comment   Sodium 136  135 - 145 (mEq/L)    Potassium 3.9  3.5 - 5.1 (mEq/L)    Chloride 105  96 - 112 (mEq/L)    CO2 22  19 - 32 (mEq/L)    Glucose, Bld 173 (*) 70 - 99 (mg/dL)    BUN 14  6 - 23 (mg/dL)    Creatinine, Ser 8.11 (*) 0.50 - 1.10 (mg/dL)    Calcium 7.5 (*) 8.4 - 10.5 (mg/dL)    GFR calc non Af Amer 48 (*) >60 (mL/min)    GFR calc Af Amer 58 (*) >60 (mL/min)   HEPATIC FUNCTION PANEL     Status: Abnormal   Collection Time   11/18/10 12:13 PM      Component Value Range Comment   Total Protein 4.0 (*) 6.0 - 8.3 (g/dL)    Albumin 1.6 (*) 3.5 - 5.2 (g/dL)    AST 10  0 - 37 (U/L)    ALT 8  0 - 35 (U/L)    Alkaline Phosphatase 75  39 - 117 (U/L)    Total Bilirubin 0.2 (*) 0.3 - 1.2 (mg/dL)    Bilirubin, Direct 0.1  0.0 - 0.3 (mg/dL)    Indirect Bilirubin 0.1 (*) 0.3 - 0.9 (mg/dL)   PREPARE RBC (CROSSMATCH)     Status: Normal   Collection Time   11/18/10  1:00 PM      Component Value Range Comment   Order Confirmation ORDER PROCESSED BY BLOOD BANK     TYPE AND SCREEN     Status: Normal (Preliminary result)   Collection Time   11/18/10  1:25 PM  Component Value Range Comment   ABO/RH(D) O POS      Antibody Screen NEG      Sample Expiration 11/21/2010      Unit Number [id number]      Blood Component Type RED CELLS,LR      Unit division 00      Status of Unit ISSUED,FINAL      Transfusion Status OK TO TRANSFUSE      Crossmatch Result Compatible      Unit Number [id number]      Blood Component  Type RED CELLS,LR      Unit division 00      Status of Unit ISSUED,FINAL      Transfusion Status OK TO TRANSFUSE      Crossmatch Result Compatible      Unit Number [id number]      Blood Component Type RED CELLS,LR      Unit division 00      Status of Unit ISSUED,FINAL      Transfusion Status OK TO TRANSFUSE      Crossmatch Result Compatible      Unit Number [id number]      Blood Component Type RED CELLS,LR      Unit division 00      Status of Unit ISSUED      Transfusion Status OK TO TRANSFUSE      Crossmatch Result Compatible      Unit Number [id number]      Blood Component Type RED CELLS,LR      Unit division 00      Status of Unit ALLOCATED      Transfusion Status OK TO TRANSFUSE      Crossmatch Result Compatible      Unit Number [id number]      Blood Component Type RED CELLS,LR      Unit division 00      Status of Unit ALLOCATED      Transfusion Status OK TO TRANSFUSE      Crossmatch Result Compatible      Unit Number [id number]      Blood Component Type RED CELLS,LR      Unit division 00      Status of Unit ALLOCATED      Transfusion Status OK TO TRANSFUSE      Crossmatch Result Compatible     PROTIME-INR     Status: Abnormal   Collection Time   11/18/10  1:25 PM      Component Value Range Comment   Prothrombin Time 15.3 (*) 11.6 - 15.2 (seconds)    INR 1.18  0.00 - 1.49    APTT     Status: Abnormal   Collection Time   11/18/10  1:25 PM      Component Value Range Comment   aPTT 39 (*) 24 - 37 (seconds)   ABO/RH     Status: Normal   Collection Time   11/18/10  1:25 PM      Component Value Range Comment   ABO/RH(D) O POS     PREPARE RBC (CROSSMATCH)     Status: Normal   Collection Time   11/18/10  1:26 PM      Component Value Range Comment   Order Confirmation ORDER PROCESSED BY BLOOD BANK     MRSA PCR SCREENING     Status: Normal   Collection Time   11/18/10  5:38 PM      Component Value Range Comment   MRSA by PCR NEGATIVE  NEGATIVE  GLUCOSE, CAPILLARY      Status: Abnormal   Collection Time   11/18/10  6:36 PM      Component Value Range Comment   Glucose-Capillary 120 (*) 70 - 99 (mg/dL)    Comment 1 Notify RN      Comment 2 Documented in Chart     GLUCOSE, CAPILLARY     Status: Abnormal   Collection Time   11/18/10  8:50 PM      Component Value Range Comment   Glucose-Capillary 102 (*) 70 - 99 (mg/dL)    Comment 1 Documented in Chart      Comment 2 Notify RN     HEMOGLOBIN AND HEMATOCRIT, BLOOD     Status: Abnormal   Collection Time   11/18/10 11:50 PM      Component Value Range Comment   Hemoglobin 8.9 (*) 12.0 - 15.0 (g/dL) POST TRANSFUSION SPECIMEN   HCT 26.0 (*) 36.0 - 46.0 (%)   BASIC METABOLIC PANEL     Status: Abnormal   Collection Time   11/19/10  5:30 AM      Component Value Range Comment   Sodium 136  135 - 145 (mEq/L)    Potassium 4.0  3.5 - 5.1 (mEq/L)    Chloride 108  96 - 112 (mEq/L)    CO2 20  19 - 32 (mEq/L)    Glucose, Bld 113 (*) 70 - 99 (mg/dL)    BUN 36 (*) 6 - 23 (mg/dL) DELTA CHECK NOTED   Creatinine, Ser 1.40 (*) 0.50 - 1.10 (mg/dL)    Calcium 7.1 (*) 8.4 - 10.5 (mg/dL)    GFR calc non Af Amer 42 (*) >60 (mL/min)    GFR calc Af Amer 51 (*) >60 (mL/min)   CBC     Status: Abnormal   Collection Time   11/19/10  5:30 AM      Component Value Range Comment   WBC 12.2 (*) 4.0 - 10.5 (K/uL)    RBC 3.10 (*) 3.87 - 5.11 (MIL/uL)    Hemoglobin 9.2 (*) 12.0 - 15.0 (g/dL)    HCT 53.6 (*) 64.4 - 46.0 (%)    MCV 87.1  78.0 - 100.0 (fL)    MCH 29.7  26.0 - 34.0 (pg)    MCHC 34.1  30.0 - 36.0 (g/dL)    RDW 03.4 (*) 74.2 - 15.5 (%)    Platelets 308  150 - 400 (K/uL)   PREPARE RBC (CROSSMATCH)     Status: Normal   Collection Time   11/19/10  9:30 AM      Component Value Range Comment   Order Confirmation ORDER PROCESSED BY BLOOD BANK     CBC     Status: Abnormal   Collection Time   11/19/10  9:45 AM      Component Value Range Comment   WBC 13.8 (*) 4.0 - 10.5 (K/uL)    RBC 2.70 (*) 3.87 - 5.11 (MIL/uL)     Hemoglobin 8.0 (*) 12.0 - 15.0 (g/dL)    HCT 59.5 (*) 63.8 - 46.0 (%)    MCV 87.0  78.0 - 100.0 (fL)    MCH 29.6  26.0 - 34.0 (pg)    MCHC 34.0  30.0 - 36.0 (g/dL)    RDW 75.6 (*) 43.3 - 15.5 (%)    Platelets 337  150 - 400 (K/uL)     Dg Abd Acute W/chest  11/18/2010  *RADIOLOGY REPORT*  Clinical Data: Hematemesis  ACUTE ABDOMEN SERIES (ABDOMEN 2 VIEW &  CHEST 1 VIEW)  Comparison: None.  Findings: Low lung volumes.  Clear lungs.  Normal heart size.  No free intraperitoneal gas no disproportionate dilatation of bowel.  Post cholecystectomy staples.  Unremarkable soft tissues.  IMPRESSION: Nonobstructive bowel gas pattern.  No active cardiopulmonary disease.  Original Report Authenticated By: Donavan Burnet, M.D.    Review of Systems  Constitutional: Positive for malaise/fatigue.  HENT: Negative.   Eyes: Negative.   Respiratory: Negative.   Cardiovascular: Negative.   Gastrointestinal: Positive for nausea and vomiting (bloody). Negative for abdominal pain, diarrhea, constipation and blood in stool. Melena: denies.  Genitourinary: Negative.   Musculoskeletal: Negative.   Skin: Negative.   Neurological: Positive for dizziness and weakness.  Endo/Heme/Allergies: Negative.   Psychiatric/Behavioral: Negative.    Blood pressure 127/65, pulse 113, temperature 98.8 F (37.1 C), temperature source Oral, resp. rate 21, height 5\' 5"  (1.651 m), weight 129.275 kg (285 lb), last menstrual period 10/01/2010, SpO2 100.00%. Physical Exam  Constitutional: She is oriented to person, place, and time. She appears well-nourished.       Morbidly obese  HENT:  Head: Normocephalic and atraumatic.  Eyes: Conjunctivae and EOM are normal. Pupils are equal, round, and reactive to light. No scleral icterus.       Conjunctival pallor  Neck: No tracheal deviation present. No thyromegaly present.  Cardiovascular: Normal rate, regular rhythm and normal heart sounds.   Respiratory: Effort normal and breath sounds  normal. No respiratory distress. She has no wheezes. She exhibits no tenderness.  GI: Soft. Bowel sounds are normal. She exhibits no distension and no mass. There is no tenderness. There is no rebound and no guarding.  Musculoskeletal: She exhibits no edema and no tenderness.  Lymphadenopathy:    She has no cervical adenopathy.  Neurological: She is alert and oriented to person, place, and time.  Skin: Skin is warm and dry.    Assessment/Plan: Upper GI bleed, refractory bleeding gastric ulcer. At this point I did have a long discussion with the patient regarding the findings. This point it is advised patient does affirm emergent surgical exploration and partial gastrectomy. Risks benefits and alternatives are discussed with patient. Will plan to proceed to the operating room for emergent management of her refractory bleeding gastric ulcer. Continued transfusion will be administered. We'll also continue to stay ahead on available packed RBCs.  Daion Ginsberg C 11/19/2010, 10:29 AM

## 2010-11-19 NOTE — Progress Notes (Signed)
Dr. Caesar Bookman informed staff that patient was having surgery this am for a bleed.  Pt remains in operative services area.

## 2010-11-19 NOTE — Anesthesia Procedure Notes (Addendum)
Procedure Name: Intubation Date/Time: 11/19/2010 11:08 AM Performed by: Marylene Buerger Preoxygenation: Pre-oxygenation with 100% oxygen    Date/Time: 11/19/2010 11:13 AM Performed by: Marylene Buerger Patient Re-evaluated:Patient Re-evaluated prior to inductionIntubation Type: IV induction, Rapid sequence and Circoid Pressure applied Laryngoscope Size: Mac and 3 Grade View: Grade I Tube type: Oral Tube size: 7.0 mm Number of attempts: 1 Placement Confirmation: ETT inserted through vocal cords under direct vision,  positive ETCO2 and breath sounds checked- equal and bilateral Secured at: 22 cm Tube secured with: Tape Dental Injury: Teeth and Oropharynx as per pre-operative assessment

## 2010-11-19 NOTE — Progress Notes (Signed)
Endo nurse to floor to take patient for EGD. Night nurse, Consuello Masse, RN, reported to Endo nurse.  Assisted patient to w/c. Only a visual assessment done at this time.  Will do complete assessment and documentation after patient returns from EGD.

## 2010-11-19 NOTE — Transfer of Care (Signed)
Immediate Anesthesia Transfer of Care Note  Patient: Karla Anderson  Procedure(s) Performed:  EXPLORATORY LAPAROTOMY; GASTRECTOMY - Partial Gastrectomy; GASTROJEJUNOSTOMY - Roux-En-Y  Patient Location: PACU  Anesthesia Type: General  Level of Consciousness: awake and alert   Airway & Oxygen Therapy: Patient Spontanous Breathing and Patient connected to face mask oxygen  Post-op Assessment: Report given to PACU RN, Post -op Vital signs reviewed and stable and Patient moving all extremities  Post vital signs: Reviewed and stable  Complications: No apparent anesthesia complications

## 2010-11-19 NOTE — Progress Notes (Signed)
Nutrition Note  Pt is in surgery for gastrectomy. Unable to complete full assessment at this time. Will complete full assessment when pt returns and will address significant wt change when pt is able to provide wt loss hx.

## 2010-11-20 LAB — CBC
MCH: 29.9 pg (ref 26.0–34.0)
MCHC: 33.8 g/dL (ref 30.0–36.0)
MCV: 88.4 fL (ref 78.0–100.0)
Platelets: 321 10*3/uL (ref 150–400)
RBC: 2.94 MIL/uL — ABNORMAL LOW (ref 3.87–5.11)

## 2010-11-20 LAB — GLUCOSE, CAPILLARY
Glucose-Capillary: 123 mg/dL — ABNORMAL HIGH (ref 70–99)
Glucose-Capillary: 140 mg/dL — ABNORMAL HIGH (ref 70–99)

## 2010-11-20 LAB — COMPREHENSIVE METABOLIC PANEL
AST: 13 U/L (ref 0–37)
CO2: 19 mEq/L (ref 19–32)
Calcium: 7.7 mg/dL — ABNORMAL LOW (ref 8.4–10.5)
Creatinine, Ser: 1.73 mg/dL — ABNORMAL HIGH (ref 0.50–1.10)
GFR calc Af Amer: 40 mL/min — ABNORMAL LOW (ref >60)
GFR calc non Af Amer: 33 mL/min — ABNORMAL LOW (ref >60)
Glucose, Bld: 141 mg/dL — ABNORMAL HIGH (ref 70–99)
Sodium: 135 mEq/L (ref 135–145)
Total Protein: 4.4 g/dL — ABNORMAL LOW (ref 6.0–8.3)

## 2010-11-20 LAB — HEMOGLOBIN AND HEMATOCRIT, BLOOD
HCT: 25.6 % — ABNORMAL LOW (ref 36.0–46.0)
Hemoglobin: 8.8 g/dL — ABNORMAL LOW (ref 12.0–15.0)

## 2010-11-20 NOTE — Addendum Note (Signed)
Addendum  created 11/20/10 9604 by Despina Hidden   Modules edited:Notes Section

## 2010-11-20 NOTE — Addendum Note (Signed)
Addendum  created 11/20/10 1211 by Minerva Areola, CRNA   Modules edited:Anesthesia Events

## 2010-11-20 NOTE — Progress Notes (Signed)
1 Day Post-Op  Subjective: Pain fairly well-controlled. Denies any nausea. Patient's main complaint is feeling hungry and wants to eat. She denies any fevers or chills. No noted bowel issues.  Objective: Vital signs in last 24 hours: Temp:  [98.5 F (36.9 Anderson)-101 F (38.3 Anderson)] 100.3 F (37.9 Anderson) (08/21 1200) Pulse Rate:  [99-135] 119  (08/21 1300) Resp:  [14-27] 25  (08/21 1300) BP: (85-127)/(28-88) 113/80 mmHg (08/21 1300) SpO2:  [94 %-100 %] 96 % (08/21 1300) Last BM Date: 11/18/10  Intake/Output from previous day: 08/20 0701 - 08/21 0700 In: 7238.3 [P.O.:2160; I.V.:4378.3; Blood:700] Out: 1490 [Urine:500; Emesis/NG output:750; Drains:30; Blood:160] Intake/Output this shift: I/O this shift: In: 720 [P.O.:720] Out: 145 [Urine:125; Other:20]  General appearance: alert and no distress Eyes: Pupils equal round reactive, extraocular movements are intact, no conjunctiva pallor is noted. GI: Soft, expected moderate abdominal tenderness. No diffuse peritoneal signs. Dressings clean and dry with no shadowing. JP is serosanguineous. No bilious discharge is noted.  Lab Results:  @LABLAST2 (wbc:2,hgb:2,hct:2,plt:2) BMET  Basename 11/20/10 0501 11/19/10 0530  NA 135 136  K 4.5 4.0  CL 107 108  CO2 19 20  GLUCOSE 141* 113*  BUN 49* 36*  CREATININE 1.73* 1.40*  CALCIUM 7.7* 7.1*   PT/INR  Basename 11/18/10 1325  LABPROT 15.3*  INR 1.18   ABG No results found for this basename: PHART:2,PCO2:2,PO2:2,HCO3:2 in the last 72 hours  Studies/Results: Dg Abd Acute W/chest  11/18/2010  *RADIOLOGY REPORT*  Clinical Data: Hematemesis  ACUTE ABDOMEN SERIES (ABDOMEN 2 VIEW & CHEST 1 VIEW)  Comparison: None.  Findings: Low lung volumes.  Clear lungs.  Normal heart size.  No free intraperitoneal gas no disproportionate dilatation of bowel.  Post cholecystectomy staples.  Unremarkable soft tissues.  IMPRESSION: Nonobstructive bowel gas pattern.  No active cardiopulmonary disease.  Original Report  Authenticated By: Donavan Burnet, M.D.    Anti-infectives: Anti-infectives     Start     Dose/Rate Route Frequency Ordered Stop   11/19/10 0600   erythromycin 250 mg in sodium chloride 0.9 % 100 mL IVPB        250 mg 100 mL/hr over 60 Minutes Intravenous  Once 11/19/10 0550 11/19/10 0724          Assessment/Plan: s/p Procedure(s): EXPLORATORY LAPAROTOMY GASTRECTOMY GASTROJEJUNOSTOMY No evidence of any further GI bleeding. Hemoglobin has remained stable. The drop from her preop levels is likely a combination of delusional plus preoperative gastrointestinal bleeding. I had a long discussion again with the patient regarding bowel rest and her current n.p.o. status. I will check a contrast study on postop day 5 (4 days from now) to evaluate the gastrojejunal anastomosis. Again I discussed strict n.p.o. with patient. Will continue pain control. Increase activity. Continue nasogastric decompression. We'll transfer the patient to telemetry floor.  LOS: 2 days    Karla Anderson 11/20/2010

## 2010-11-20 NOTE — Anesthesia Post-op Follow-up Note (Signed)
Patient alert and oriented.  VSS, HR 122. Denies recall, No apparent anesthesia complications.  Bufford Lope, CRNA.

## 2010-11-20 NOTE — Progress Notes (Addendum)
INITIAL ADULT NUTRITION ASSESSMENT Date: 11/20/2010   Time: 11:20 AM Reason for Assessment: bulbar bleeding ulcer, s/p exploratory laparotomy, antrectomy, Roux-en Y, gastro-jejunostomy  ASSESSMENT: Female 40 y.o.  Dx: Upper GI bleed  PMH:  Past Medical History  Diagnosis Date  . Tachycardia   . Anxiety   . Depression   . Heart murmur   . Back pain   . Bipolar 1 disorder   . Arthritis     bil knee   Scheduled Meds:   . sodium chloride   Intravenous Once  . cefOXitin  2 g Intravenous 60 min Pre-Op  . cefOXitin (MEFOXIN) IVPB 2 gram/50 mL D5W (Pyxis)      . cefOXitin (MEFOXIN) IVPB 2 gram/50 mL D5W (Pyxis)      . diphenhydrAMINE  25 mg Intravenous Once  . enoxaparin  40 mg Subcutaneous Q24H  . EPINEPHrine      . insulin aspart  0-5 Units Subcutaneous QHS  . insulin aspart  0-9 Units Subcutaneous Q4H  . meperidine      . metoprolol      . midazolam      . phenylephrine      . propofol      . rocuronium      . rocuronium      . sodium chloride      . sodium chloride      . succinylcholine      . SUFentanil      . SUFentanil       Continuous Infusions:   . lactated ringers 100 mL/hr at 11/20/10 0600  . pantoprozole (PROTONIX) infusion 8 mg/hr (11/19/10 0625)  . sodium chloride 0.9 % 1,000 mL with potassium chloride 10 mEq infusion    . DISCONTD: lactated ringers     PRN Meds:.HYDROmorphone, ondansetron (ZOFRAN) IV, ondansetron, promethazine, promethazine, DISCONTD:  HYDROmorphone (DILAUDID) injection, DISCONTD: midazolam, DISCONTD: ondansetron (ZOFRAN) IV, DISCONTD: povidone-iodine, DISCONTD: sodium chloride  Ht: 5\' 5"  (165.1 cm)  Wt: 285 lb (129.275 kg)  Ideal Wt: 57 kg 61.5 kg % Ideal Wt: 228 %  Usual Wt: 365 % Usual Wt: 78.1%  Body mass index is 47.43 kg/(m^2).  Food/Nutrition Related Hx: Pt is alert and oriented. Pt is s/p exploratory laparotomy, antrectomy, Roux-en Y, gastro-jejunostomy performed on 11/19/10 due to bulbar bleeding ulcer. Pt was not able  to provide usual diet history. However, Pt reported recent inability to eat due to stomach pain x2 mo. Pt reported UBW of 365# prior to significant wt loss of 80# x2 mo. Rec slow adv of diet beginning with 2 oz water Q4hr sips. Adv to broth with Pro-Stat QID (288 kcal, 60 grams protein) as tol. Rec fluids provided 30 min prior to and after broth.   Labs:  CBC    Component Value Date/Time   WBC 18.2* 11/20/2010 0501   RBC 2.94* 11/20/2010 0501   HGB 8.8* 11/20/2010 0501   HCT 26.0* 11/20/2010 0501   PLT 321 11/20/2010 0501   MCV 88.4 11/20/2010 0501   MCH 29.9 11/20/2010 0501   MCHC 33.8 11/20/2010 0501   RDW 15.6* 11/20/2010 0501   LYMPHSABS 1.8 11/18/2010 1213   MONOABS 0.7 11/18/2010 1213   EOSABS 0.1 11/18/2010 1213   BASOSABS 0.0 11/18/2010 1213   BMET    Component Value Date/Time   NA 135 11/20/2010 0501   K 4.5 11/20/2010 0501   CL 107 11/20/2010 0501   CO2 19 11/20/2010 0501   GLUCOSE 141* 11/20/2010 0501   BUN 49* 11/20/2010  0501   CREATININE 1.73* 11/20/2010 0501   CALCIUM 7.7* 11/20/2010 0501   GFRNONAA 33* 11/20/2010 0501   GFRAA 40* 11/20/2010 0501    Intake/Output Summary (Last 24 hours) at 11/20/10 1131 Last data filed at 11/20/10 1000  Gross per 24 hour  Intake 5938.33 ml  Output   1635 ml  Net 4303.33 ml    Diet Order: NPO  Supplements/Tube Feeding: None at this time  IVF:    lactated ringers Last Rate: 100 mL/hr at 11/20/10 0600  pantoprozole (PROTONIX) infusion Last Rate: 8 mg/hr (11/19/10 0625)  sodium chloride 0.9 % 1,000 mL with potassium chloride 10 mEq infusion   DISCONTD: lactated ringers     Estimated Nutritional Needs:   Kcal: 1350-1450 / day Protein: 75-85 gm / day Fluid: 1.4-1.5 L / day   NUTRITION DIAGNOSIS: -Inadequate oral intake (NI-2.1).  Status: Ongoing  RELATED TO:  -bulbar bleeding ulcer and s/p Roux-en Y, gastrojujenostomy  AS EVIDENCE BY:  -21.9% wt loss and reported poor po intake x2 mo.  -NPO  diet  MONITORING/EVALUATION(Goals): -Pt will avoid vomiting and dumping. -Pt will maintain hydration and minimize symptoms of malabsorption and/or maldigestion -Monitor wt changes, labs and adv of oral intake.  EDUCATION NEEDS: -Education not appropriate at this time  INTERVENTION: -Rec slow adv of diet as tol. Rec begin with sips of water- 2oz Q4hr. Adv to broth with Pro-Stat QID (288 kcal, 60 gm protein).  -Will provide bariatric diet education and materials and will refer to outpatient nutrition services prior to discharge   Dietitian [phone]  DOCUMENTATION CODES Per approved criteria  -Severe malnutrition in the context of acute illness or injury    Karla Anderson 11/20/2010, 11:20 AM

## 2010-11-21 LAB — COMPREHENSIVE METABOLIC PANEL
ALT: 9 U/L (ref 0–35)
Alkaline Phosphatase: 56 U/L (ref 39–117)
CO2: 22 mEq/L (ref 19–32)
Chloride: 107 mEq/L (ref 96–112)
GFR calc Af Amer: 52 mL/min — ABNORMAL LOW (ref >60)
GFR calc non Af Amer: 43 mL/min — ABNORMAL LOW (ref >60)
Glucose, Bld: 110 mg/dL — ABNORMAL HIGH (ref 70–99)
Potassium: 4.4 mEq/L (ref 3.5–5.1)
Sodium: 136 mEq/L (ref 135–145)
Total Bilirubin: 0.2 mg/dL — ABNORMAL LOW (ref 0.3–1.2)
Total Protein: 4.5 g/dL — ABNORMAL LOW (ref 6.0–8.3)

## 2010-11-21 LAB — GLUCOSE, CAPILLARY
Glucose-Capillary: 88 mg/dL (ref 70–99)
Glucose-Capillary: 102 mg/dL — ABNORMAL HIGH (ref 70–99)
Glucose-Capillary: 53 mg/dL — ABNORMAL LOW (ref 70–99)

## 2010-11-21 LAB — CBC
HCT: 21.6 % — ABNORMAL LOW (ref 36.0–46.0)
Hemoglobin: 7.2 g/dL — ABNORMAL LOW (ref 12.0–15.0)
RBC: 2.4 MIL/uL — ABNORMAL LOW (ref 3.87–5.11)

## 2010-11-21 LAB — HEMOGLOBIN AND HEMATOCRIT, BLOOD: Hemoglobin: 8.5 g/dL — ABNORMAL LOW (ref 12.0–15.0)

## 2010-11-21 MED ORDER — PANTOPRAZOLE SODIUM 40 MG IV SOLR
40.0000 mg | Freq: Two times a day (BID) | INTRAVENOUS | Status: DC
  Administered 2010-11-21 – 2010-11-26 (×11): 40 mg via INTRAVENOUS
  Filled 2010-11-21 (×11): qty 40

## 2010-11-21 MED ORDER — SODIUM CHLORIDE 0.9 % IJ SOLN
INTRAMUSCULAR | Status: AC
  Administered 2010-11-21: 10 mL
  Filled 2010-11-21: qty 10

## 2010-11-21 MED ORDER — DEXTROSE 50 % IV SOLN
INTRAVENOUS | Status: AC
  Administered 2010-11-21: 50 mL via INTRAVENOUS
  Filled 2010-11-21: qty 50

## 2010-11-21 NOTE — Progress Notes (Signed)
Subjective Alert, Lungs clears.  She says she feels much b etter. NG in place. Draining dark brown stomach contents.  Output 11/20/2010 2,000cc.  She is NPO.  Dressing in place to abdomen.  Bowel sounds are positive. She underwent a laparotomy 11/20/2010 by Dr. Leticia Penna. She does have a drop in her hemoglobin. 7.2 from 8.8 yesterday.   Objective: Vital signs in last 24 hours: Temp:  [98 F (36.7 C)-100.3 F (37.9 C)] 98 F (36.7 C) (08/22 0612) Pulse Rate:  [110-121] 110  (08/22 0612) Resp:  [14-25] 18  (08/22 0612) BP: (109-132)/(58-81) 132/81 mmHg (08/22 0612) SpO2:  [96 %-100 %] 97 % (08/22 0612) Last BM Date: 11/18/10  Intake/Output from previous day: 08/21 0701 - 08/22 0700 In: 1950 [P.O.:1150; I.V.:800] Out: 2850 [Urine:800; Emesis/NG output:2000; Drains:30] Intake/Output this shift:    General appearance: alert, cooperative, no distress and morbidly obese Alert, Lungs clear.  BS are positive. Abdomen is soft. Dressing in place, clean.   Lab Results:  Basename 11/21/10 0502 11/20/10 1145 11/20/10 0501 11/19/10 1406  WBC 12.6* -- 18.2* 18.7*  HGB 7.2* 8.8* 8.8* --  HCT 21.6* 25.6* 26.0* --  PLT 251 -- 321 293   BMET  Basename 11/21/10 0502 11/20/10 0501 11/19/10 0530  NA 136 135 136  K 4.4 4.5 4.0  CL 107 107 108  CO2 22 19 20   GLUCOSE 110* 141* 113*  BUN 47* 49* 36*  CREATININE 1.37* 1.73* 1.40*  CALCIUM 8.0* 7.7* 7.1*   LFT  Basename 11/21/10 0502 11/18/10 1213  PROT 4.5* --  ALBUMIN 1.7* --  AST 10 --  ALT 9 --  ALKPHOS 56 --  BILITOT 0.2* --  BILIDIR -- 0.1  IBILI -- 0.1*   PT/INR  Basename 11/18/10 1325  LABPROT 15.3*  INR 1.18   Hepatitis Panel No results found for this basename: HEPBSAG,HCVAB,HEPAIGM,HEPBIGM in the last 72 hours C-Diff No results found for this basename: CDIFFTOX:3 in the last 72 hours Fecal Lactopherrin No results found for this basename: FECLLACTOFRN in the last 72 hours  Studies/Results: No results  found.  Medications: I have reviewed the patient's current medications.  Assessment/Plan:     Will continue to monitor.   LOS: 3 days   Karla Anderson W 11/21/2010, 9:29 AM

## 2010-11-21 NOTE — Progress Notes (Signed)
CBG: 63  Treatment: D50 IV 50 mL  Symptoms: Pale and Sweaty  Follow-up CBG: Time:1730 CBG Result:102  Possible Reasons for Event: Inadequate meal intake  Comments/MD notified:Dr. Leticia Penna notified of hypoglycemic event. No new orders received at this time. Will continue to monitor.    Karla Anderson, Joyice Faster

## 2010-11-21 NOTE — Progress Notes (Addendum)
Follow-up ADULT NUTRITION ASSESSMENT Date: 11/21/2010   Time: 10:30 AM Reason for Assessment: bulbar bleeding ulcer, s/p exploratory laparotomy, antrectomy, Roux-en Y, gastro-jejunostomy  ASSESSMENT: Female 40 y.o.  Dx: Upper GI bleed  PMH:  Past Medical History  Diagnosis Date  . Tachycardia   . Anxiety   . Depression   . Heart murmur   . Back pain   . Bipolar 1 disorder   . Arthritis     bil knee   Scheduled Meds:    . diphenhydrAMINE  25 mg Intravenous Once  . enoxaparin  40 mg Subcutaneous Q24H  . insulin aspart  0-5 Units Subcutaneous QHS  . insulin aspart  0-9 Units Subcutaneous Q4H   Continuous Infusions:    . lactated ringers 1,000 mL (11/21/10 0947)  . pantoprozole (PROTONIX) infusion 8 mg/hr (11/19/10 0625)  . sodium chloride 0.9 % 1,000 mL with potassium chloride 10 mEq infusion     PRN Meds:.HYDROmorphone, ondansetron (ZOFRAN) IV, ondansetron, promethazine, promethazine  Ht: 5\' 5"  (165.1 cm)  Wt: 285 lb (129.275 kg)  Ideal Wt: 57 kg 61.5 kg % Ideal Wt: 228 %  Usual Wt: 365 % Usual Wt: 78.1%  Body mass index is 47.43 kg/(m^2).  Food/Nutrition Related Hx: Pt is alert and oriented. Pt is s/p exploratory laparotomy, antrectomy, Roux-en Y, gastro-jejunostomy performed on 11/19/10 due to bulbar bleeding ulcer. Pt was not able to provide usual diet history. However, Pt reported recent inability to eat due to stomach pain x2 mo. Pt reported UBW of 365# prior to significant wt loss of 80# x2 mo. Rec slow adv of diet beginning with 2 oz water Q4hr sips. Adv to broth with Pro-Stat QID (288 kcal, 60 grams protein) as tol. Rec fluids provided 30 min prior to and after broth.  11/21/10 Pt is awake and alert. Pt reports hunger and increased appetite. Will monitor diet adv.   Labs:  CBC    Component Value Date/Time   WBC 12.6* 11/21/2010 0502   RBC 2.40* 11/21/2010 0502   HGB 7.2* 11/21/2010 0502   HCT 21.6* 11/21/2010 0502   PLT 251 11/21/2010 0502   MCV 90.0  11/21/2010 0502   MCH 30.0 11/21/2010 0502   MCHC 33.3 11/21/2010 0502   RDW 15.8* 11/21/2010 0502   LYMPHSABS 1.8 11/18/2010 1213   MONOABS 0.7 11/18/2010 1213   EOSABS 0.1 11/18/2010 1213   BASOSABS 0.0 11/18/2010 1213   BMET    Component Value Date/Time   NA 136 11/21/2010 0502   K 4.4 11/21/2010 0502   CL 107 11/21/2010 0502   CO2 22 11/21/2010 0502   GLUCOSE 110* 11/21/2010 0502   BUN 47* 11/21/2010 0502   CREATININE 1.37* 11/21/2010 0502   CALCIUM 8.0* 11/21/2010 0502   GFRNONAA 43* 11/21/2010 0502   GFRAA 52* 11/21/2010 0502    Intake/Output Summary (Last 24 hours) at 11/21/10 1030 Last data filed at 11/21/10 0646  Gross per 24 hour  Intake   1170 ml  Output   1705 ml  Net   -535 ml    Diet Order:    Supplements/Tube Feeding: None at this time  IVF:     lactated ringers Last Rate: 1,000 mL (11/21/10 0947)  pantoprozole (PROTONIX) infusion Last Rate: 8 mg/hr (11/19/10 0625)  sodium chloride 0.9 % 1,000 mL with potassium chloride 10 mEq infusion     Estimated Nutritional Needs:   Kcal: 1350-1450 / day Protein: 75-85 gm / day Fluid: 1.4-1.5 L / day   NUTRITION DIAGNOSIS: -Inadequate  oral intake (NI-2.1).  Status: Ongoing   RELATED TO:  -bulbar bleeding ulcer and s/p Roux-en Y, gastrojujenostomy  AS EVIDENCE BY:  -21.9% wt loss and reported poor po intake x2 mo.  -NPO diet  MONITORING/EVALUATION(Goals): -Pt will avoid vomiting and dumping. -Pt will maintain hydration and minimize symptoms of malabsorption and/or maldigestion -Monitor wt changes, labs and adv of oral intake.  EDUCATION NEEDS: -Education not appropriate at this time  INTERVENTION: -Rec slow adv of diet as tol. Rec begin with sips of water- 2oz Q4hr. Adv to broth with Pro-Stat QID (288 kcal, 60 gm protein).  -Will provide bariatric diet education and materials and will refer to outpatient nutrition services prior to discharge   Dietitian [phone]  DOCUMENTATION CODES Per approved criteria    -Severe malnutrition in the context of acute illness or injury    Abe People 11/21/2010, 10:30 AM

## 2010-11-21 NOTE — Progress Notes (Signed)
2 Days Post-Op  Subjective: Pain controlled. No fevers.  No nausea.   Objective: Vital signs in last 24 hours: Temp:  [98 F (36.7 C)-100.3 F (37.9 C)] 98 F (36.7 C) (08/22 0612) Pulse Rate:  [110-121] 110  (08/22 0612) Resp:  [14-25] 18  (08/22 0612) BP: (112-132)/(58-81) 132/81 mmHg (08/22 0612) SpO2:  [96 %-100 %] 97 % (08/22 0612) Last BM Date: 11/16/10  Intake/Output from previous day: 08/21 0701 - 08/22 0700 In: 1950 [P.O.:1150; I.V.:800] Out: 2850 [Urine:800; Emesis/NG output:2000; Drains:30] Intake/Output this shift:    General appearance: alert and no distress GI: Soft, quiet, inc c/d/i, JP seroSANG in color.  Scant discharge.  Expected tenderness.  Lab Results:  @LABLAST2 (wbc:2,hgb:2,hct:2,plt:2) BMET  Basename 11/21/10 0502 11/20/10 0501  NA 136 135  K 4.4 4.5  CL 107 107  CO2 22 19  GLUCOSE 110* 141*  BUN 47* 49*  CREATININE 1.37* 1.73*  CALCIUM 8.0* 7.7*   PT/INR  Basename 11/18/10 1325  LABPROT 15.3*  INR 1.18   ABG No results found for this basename: PHART:2,PCO2:2,PO2:2,HCO3:2 in the last 72 hours  Studies/Results: No results found.  Anti-infectives: Anti-infectives     Start     Dose/Rate Route Frequency Ordered Stop   11/19/10 0600   erythromycin 250 mg in sodium chloride 0.9 % 100 mL IVPB        250 mg 100 mL/hr over 60 Minutes Intravenous  Once 11/19/10 0550 11/19/10 0724          Assessment/Plan: s/p Procedure(s): EXPLORATORY LAPAROTOMY GASTRECTOMY GASTROJEJUNOSTOMY Plan upper GI on Saturday to eval GJ anast.  Conintue bowel rest for now.  Increase activity.  LOS: 3 days    Bethel Sirois C 11/21/2010

## 2010-11-21 NOTE — Progress Notes (Signed)
IV site in right upper arm where blood was infusing infiltrated and began to leak. Large bruised area noted to right upper arm/armpit area. Pt notified and ice pack placed to upper arm. Will continue to monitor.

## 2010-11-22 ENCOUNTER — Encounter (HOSPITAL_COMMUNITY): Payer: Self-pay | Admitting: Internal Medicine

## 2010-11-22 LAB — GLUCOSE, CAPILLARY: Glucose-Capillary: 85 mg/dL (ref 70–99)

## 2010-11-22 LAB — TYPE AND SCREEN
ABO/RH(D): O POS
Antibody Screen: NEGATIVE
Unit division: 0
Unit division: 0
Unit division: 0
Unit division: 0
Unit division: 0

## 2010-11-22 LAB — COMPREHENSIVE METABOLIC PANEL
AST: 11 U/L (ref 0–37)
CO2: 23 mEq/L (ref 19–32)
Calcium: 8.1 mg/dL — ABNORMAL LOW (ref 8.4–10.5)
Creatinine, Ser: 0.89 mg/dL (ref 0.50–1.10)
GFR calc Af Amer: >60 mL/min (ref >60)
GFR calc non Af Amer: >60 mL/min (ref >60)

## 2010-11-22 LAB — CBC
MCH: 30.5 pg (ref 26.0–34.0)
MCHC: 33.6 g/dL (ref 30.0–36.0)
MCV: 90.8 fL (ref 78.0–100.0)
Platelets: 209 10*3/uL (ref 150–400)
RBC: 2.49 MIL/uL — ABNORMAL LOW (ref 3.87–5.11)
RDW: 15.6 % — ABNORMAL HIGH (ref 11.5–15.5)

## 2010-11-22 MED ORDER — DEXTROSE 50 % IV SOLN
INTRAVENOUS | Status: AC
  Administered 2010-11-22: 50 mL via INTRAVENOUS
  Filled 2010-11-22: qty 50

## 2010-11-22 MED ORDER — SODIUM CHLORIDE 0.9 % IJ SOLN
INTRAMUSCULAR | Status: AC
  Administered 2010-11-22: 23:00:00
  Filled 2010-11-22: qty 10

## 2010-11-22 MED ORDER — DEXTROSE 50 % IV SOLN
INTRAVENOUS | Status: AC
  Administered 2010-11-22: 25 mL via INTRAVENOUS
  Filled 2010-11-22: qty 50

## 2010-11-22 MED ORDER — DEXTROSE 50 % IV SOLN
25.0000 mL | Freq: Once | INTRAVENOUS | Status: AC | PRN
  Administered 2010-11-22 – 2010-11-23 (×4): 25 mL via INTRAVENOUS

## 2010-11-22 MED ORDER — DEXTROSE 50 % IV SOLN
INTRAVENOUS | Status: AC
  Administered 2010-11-22: 50 mL
  Filled 2010-11-22: qty 50

## 2010-11-22 MED ORDER — SODIUM CHLORIDE 0.9 % IJ SOLN
INTRAMUSCULAR | Status: AC
  Administered 2010-11-22: 10:00:00
  Filled 2010-11-22: qty 10

## 2010-11-22 NOTE — Progress Notes (Signed)
Follow-up ADULT NUTRITION ASSESSMENT Date: 11/22/2010   Time: 3:59 PM Reason for Assessment: bulbar bleeding ulcer, s/p exploratory laparotomy, antrectomy, Roux-en Y, gastro-jejunostomy  ASSESSMENT: Female 40 y.o.  Dx: Upper GI bleed  PMH:  Past Medical History  Diagnosis Date  . Tachycardia   . Anxiety   . Depression   . Heart murmur   . Back pain   . Bipolar 1 disorder   . Arthritis     bil knee   Scheduled Meds:    . dextrose      . dextrose      . dextrose      . diphenhydrAMINE  25 mg Intravenous Once  . enoxaparin  40 mg Subcutaneous Q24H  . insulin aspart  0-5 Units Subcutaneous QHS  . insulin aspart  0-9 Units Subcutaneous Q4H  . pantoprazole (PROTONIX) IV  40 mg Intravenous Q12H  . sodium chloride      . sodium chloride       Continuous Infusions:    . lactated ringers 1,000 mL (11/22/10 1125)  . sodium chloride 0.9 % 1,000 mL with potassium chloride 10 mEq infusion     PRN Meds:.HYDROmorphone, ondansetron (ZOFRAN) IV, ondansetron, promethazine, promethazine  Ht: 5\' 5"  (165.1 cm)  Wt: 285 lb (129.275 kg)  Ideal Wt: 57 kg 61.5 kg % Ideal Wt: 228 %  Usual Wt: 365 % Usual Wt: 78.1%  Body mass index is 47.43 kg/(m^2).  Food/Nutrition Related Hx: Pt is alert and oriented. Pt is s/p exploratory laparotomy, antrectomy, Roux-en Y, gastro-jejunostomy performed on 11/19/10 due to bulbar bleeding ulcer. Pt was not able to provide usual diet history. However, Pt reported recent inability to eat due to stomach pain x2 mo. Pt reported UBW of 365# prior to significant wt loss of 80# x2 mo. Rec slow adv of diet beginning with 2 oz water Q4hr sips. Adv to broth with Pro-Stat QID (288 kcal, 60 grams protein) as tol. Rec fluids provided 30 min prior to and after broth.  11/21/10 Pt is awake and alert. Pt reports hunger and increased appetite. Will monitor diet adv.   Labs:  CBC    Component Value Date/Time   WBC 8.4 11/22/2010 0551   RBC 2.49* 11/22/2010 0551   HGB  7.6* 11/22/2010 0551   HCT 22.6* 11/22/2010 0551   PLT 209 11/22/2010 0551   MCV 90.8 11/22/2010 0551   MCH 30.5 11/22/2010 0551   MCHC 33.6 11/22/2010 0551   RDW 15.6* 11/22/2010 0551   LYMPHSABS 1.8 11/18/2010 1213   MONOABS 0.7 11/18/2010 1213   EOSABS 0.1 11/18/2010 1213   BASOSABS 0.0 11/18/2010 1213   BMET    Component Value Date/Time   NA 136 11/22/2010 0551   K 3.7 11/22/2010 0551   CL 107 11/22/2010 0551   CO2 23 11/22/2010 0551   GLUCOSE 85 11/22/2010 0551   BUN 32* 11/22/2010 0551   CREATININE 0.89 11/22/2010 0551   CALCIUM 8.1* 11/22/2010 0551   GFRNONAA >60 11/22/2010 0551   GFRAA >60 11/22/2010 0551    Intake/Output Summary (Last 24 hours) at 11/22/10 1559 Last data filed at 11/22/10 1131  Gross per 24 hour  Intake 8101.57 ml  Output   1260 ml  Net 6841.57 ml    Diet Order:    Supplements/Tube Feeding: None at this time  IVF:     lactated ringers Last Rate: 1,000 mL (11/22/10 1125)  sodium chloride 0.9 % 1,000 mL with potassium chloride 10 mEq infusion  Estimated Nutritional Needs:   Kcal: 1350-1450 / day Protein: 75-85 gm / day Fluid: 1.4-1.5 L / day   NUTRITION DIAGNOSIS: -Inadequate oral intake (NI-2.1).  Status: Ongoing   RELATED TO:  -bulbar bleeding ulcer and s/p Roux-en Y, gastrojujenostomy  AS EVIDENCE BY:  -21.9% wt loss and reported poor po intake x2 mo.  -NPO diet  MONITORING/EVALUATION(Goals): -Pt will avoid vomiting and dumping. -Pt will maintain hydration and minimize symptoms of malabsorption and/or maldigestion -Monitor wt changes, labs and adv of oral intake.  EDUCATION NEEDS: -Education not appropriate at this time  INTERVENTION: -Rec slow adv of diet as tol. Rec begin with sips of water- 2oz Q4hr. Adv to broth with Pro-Stat QID (288 kcal, 60 gm protein).  -Will provide post-gastrectomy diet education and materials. -Will refer to outpatient nutrition services prior to discharge.   Dietitian [phone]  DOCUMENTATION  CODES Per approved criteria  -Severe malnutrition in the context of acute illness or injury    Abe People 11/22/2010, 3:59 PM

## 2010-11-22 NOTE — Progress Notes (Signed)
CBG: 56  Treatment: D50 IV 50 mL  Symptoms: Pale  Follow-up CBG: WUJW:1191 CBG Result 88  Possible Reasons for Event: Inadequate meal intake   Comments/MD notified:zieglar notified    Karsten Fells

## 2010-11-22 NOTE — Progress Notes (Signed)
3 Days Post-Op  Subjective: Comfortable.  No complaint of pain.  +flatus.  No nausea.  No CP or SOB.  Objective: Vital signs in last 24 hours: Temp:  [97.3 F (36.3 C)-98.4 F (36.9 C)] 97.3 F (36.3 C) (08/23 1400) Pulse Rate:  [105-113] 111  (08/23 1400) Resp:  [18-20] 20  (08/23 1400) BP: (129-150)/(78-83) 138/82 mmHg (08/23 1400) SpO2:  [99 %-100 %] 100 % (08/23 1400) Last BM Date: 11/16/10  Intake/Output from previous day: 08/22 0701 - 08/23 0700 In: 9591.6 [I.V.:8781.6; Blood:800; IV Piggyback:10] Out: 395 [Urine:350; Emesis/NG output:30; Drains:15] Intake/Output this shift:    General appearance: alert and no distress GI: +BS, soft, obese, mod tenderness, inc c/d/i.  JP seroSANG.  Scant outpt.  No peritoneal signs.  Lab Results:  @LABLAST2 (wbc:2,hgb:2,hct:2,plt:2) BMET  Basename 11/22/10 0551 11/21/10 0502  NA 136 136  K 3.7 4.4  CL 107 107  CO2 23 22  GLUCOSE 85 110*  BUN 32* 47*  CREATININE 0.89 1.37*  CALCIUM 8.1* 8.0*   PT/INR No results found for this basename: LABPROT:2,INR:2 in the last 72 hours ABG No results found for this basename: PHART:2,PCO2:2,PO2:2,HCO3:2 in the last 72 hours  Studies/Results: No results found.  Anti-infectives: Anti-infectives     Start     Dose/Rate Route Frequency Ordered Stop   11/19/10 0600   erythromycin 250 mg in sodium chloride 0.9 % 100 mL IVPB        250 mg 100 mL/hr over 60 Minutes Intravenous  Once 11/19/10 0550 11/19/10 0724          Assessment/Plan: s/p Procedure(s): EXPLORATORY LAPAROTOMY GASTRECTOMY GASTROJEJUNOSTOMY Doing well.  Continue GI rest for now.  Will check anast with UGI.  Hg down, but clinical not demonstrating signs of acute active bleeding.  Abd exam is benign.  LOS: 4 days    Rehana Uncapher C 11/22/2010

## 2010-11-23 ENCOUNTER — Inpatient Hospital Stay (HOSPITAL_COMMUNITY): Payer: Medicaid Other

## 2010-11-23 ENCOUNTER — Encounter (HOSPITAL_COMMUNITY): Payer: Medicaid Other

## 2010-11-23 LAB — GLUCOSE, CAPILLARY
Glucose-Capillary: 62 mg/dL — ABNORMAL LOW (ref 70–99)
Glucose-Capillary: 76 mg/dL (ref 70–99)
Glucose-Capillary: 58 mg/dL — ABNORMAL LOW (ref 70–99)
Glucose-Capillary: 100 mg/dL — ABNORMAL HIGH (ref 70–99)
Glucose-Capillary: 114 mg/dL — ABNORMAL HIGH (ref 70–99)
Glucose-Capillary: 56 mg/dL — ABNORMAL LOW (ref 70–99)
Glucose-Capillary: 69 mg/dL — ABNORMAL LOW (ref 70–99)
Glucose-Capillary: 65 mg/dL — ABNORMAL LOW (ref 70–99)
Glucose-Capillary: 79 mg/dL (ref 70–99)
Glucose-Capillary: 71 mg/dL (ref 70–99)

## 2010-11-23 LAB — COMPREHENSIVE METABOLIC PANEL
ALT: 9 U/L (ref 0–35)
AST: 10 U/L (ref 0–37)
Albumin: 1.7 g/dL — ABNORMAL LOW (ref 3.5–5.2)
Alkaline Phosphatase: 69 U/L (ref 39–117)
BUN: 22 mg/dL (ref 6–23)
CO2: 24 mEq/L (ref 19–32)
Creatinine, Ser: 0.72 mg/dL (ref 0.50–1.10)
Glucose, Bld: 92 mg/dL (ref 70–99)
Total Protein: 4.7 g/dL — ABNORMAL LOW (ref 6.0–8.3)

## 2010-11-23 LAB — CBC
HCT: 21.6 % — ABNORMAL LOW (ref 36.0–46.0)
Hemoglobin: 7.3 g/dL — ABNORMAL LOW (ref 12.0–15.0)
MCH: 30.7 pg (ref 26.0–34.0)
MCV: 90.8 fL (ref 78.0–100.0)
RBC: 2.38 MIL/uL — ABNORMAL LOW (ref 3.87–5.11)

## 2010-11-23 LAB — PREPARE RBC (CROSSMATCH)

## 2010-11-23 MED ORDER — DEXTROSE 50 % IV SOLN
INTRAVENOUS | Status: AC
  Administered 2010-11-23: 25 mL via INTRAVENOUS
  Filled 2010-11-23: qty 50

## 2010-11-23 MED ORDER — DEXTROSE 50 % IV SOLN
INTRAVENOUS | Status: AC
  Filled 2010-11-23: qty 50

## 2010-11-23 MED ORDER — SODIUM CHLORIDE 0.9 % IJ SOLN
INTRAMUSCULAR | Status: AC
  Administered 2010-11-23: 10 mL
  Filled 2010-11-23: qty 10

## 2010-11-23 MED ORDER — SODIUM CHLORIDE 0.9 % IJ SOLN
INTRAMUSCULAR | Status: AC
  Administered 2010-11-23: 03:00:00
  Filled 2010-11-23: qty 3

## 2010-11-23 MED ORDER — SODIUM CHLORIDE 0.9 % IV SOLN
Freq: Once | INTRAVENOUS | Status: AC
  Administered 2010-11-23: 19:00:00 via INTRAVENOUS

## 2010-11-23 MED ORDER — SODIUM CHLORIDE 0.9 % IJ SOLN
10.0000 mL | Freq: Two times a day (BID) | INTRAMUSCULAR | Status: DC
  Administered 2010-11-24 – 2010-11-26 (×2): 10 mL
  Filled 2010-11-23 (×2): qty 10

## 2010-11-23 MED ORDER — DEXTROSE 50 % IV SOLN
25.0000 mL | Freq: Once | INTRAVENOUS | Status: AC | PRN
  Administered 2010-11-23: 25 mL via INTRAVENOUS

## 2010-11-23 MED ORDER — SODIUM CHLORIDE 0.9 % IJ SOLN
INTRAMUSCULAR | Status: AC
  Administered 2010-11-23: 12:00:00
  Filled 2010-11-23: qty 10

## 2010-11-23 MED ORDER — FUROSEMIDE 10 MG/ML IJ SOLN
20.0000 mg | Freq: Once | INTRAMUSCULAR | Status: AC
  Administered 2010-11-23: 20 mg via INTRAVENOUS
  Filled 2010-11-23: qty 2

## 2010-11-23 MED ORDER — SODIUM CHLORIDE 0.9 % IJ SOLN
INTRAMUSCULAR | Status: AC
  Filled 2010-11-23: qty 10

## 2010-11-23 MED ORDER — DEXTROSE 50 % IV SOLN
INTRAVENOUS | Status: AC
  Administered 2010-11-23: 25 mL
  Administered 2010-11-23: 25 mL via INTRAVENOUS
  Filled 2010-11-23: qty 50

## 2010-11-23 MED ORDER — DEXTROSE IN LACTATED RINGERS 5 % IV SOLN
INTRAVENOUS | Status: DC
  Administered 2010-11-23: 100 mL/h via INTRAVENOUS
  Administered 2010-11-23: 18:00:00 via INTRAVENOUS

## 2010-11-23 MED ORDER — SODIUM CHLORIDE 0.9 % IJ SOLN
10.0000 mL | INTRAMUSCULAR | Status: DC | PRN
  Administered 2010-11-23 – 2010-11-24 (×3): 10 mL
  Filled 2010-11-23: qty 10
  Filled 2010-11-23: qty 20

## 2010-11-23 NOTE — Progress Notes (Signed)
CBG: 65 Treatment: D50 25 ml  Symptoms: none  Follow-up CBG: Time:1835 CBG Result:79  Possible Reasons for Event:npo  Comments/MD notified:no    Wilford Corner

## 2010-11-23 NOTE — Progress Notes (Signed)
Patient went to bathroom.  Patient had a small formed bowel movement with moderate amount of liquid stool present.

## 2010-11-23 NOTE — Progress Notes (Signed)
4 Days Post-Op  Subjective: No c/o pain.  No nausea.  No CP or SOB.  +Flatus.  Feels well.  Objective: Vital signs in last 24 hours: Temp:  [97.3 F (36.3 C)-98 F (36.7 C)] 98 F (36.7 C) (08/24 0607) Pulse Rate:  [111-126] 117  (08/24 0607) Resp:  [16-20] 20  (08/24 0607) BP: (120-138)/(82-86) 120/82 mmHg (08/24 0607) SpO2:  [98 %-100 %] 98 % (08/24 0607) Last BM Date: 11/16/10  Intake/Output from previous day: 08/23 0701 - 08/24 0700 In: 2320 [I.V.:2280; NG/GT:30; IV Piggyback:10] Out: 1115 [Urine:1000; Emesis/NG output:10; Drains:105] Intake/Output this shift:    General appearance: alert and no distress Resp: clear to auscultation bilaterally GI: +BS, soft, mild tenderness. Incision c/d/i.  JP SEROsang.  No peritoneal signs.  Lab Results:  @LABLAST2 (wbc:2,hgb:2,hct:2,plt:2) BMET  Basename 11/23/10 0528 11/22/10 0551  NA 139 136  K 3.2* 3.7  CL 109 107  CO2 24 23  GLUCOSE 92 85  BUN 22 32*  CREATININE 0.72 0.89  CALCIUM 7.8* 8.1*   PT/INR No results found for this basename: LABPROT:2,INR:2 in the last 72 hours ABG No results found for this basename: PHART:2,PCO2:2,PO2:2,HCO3:2 in the last 72 hours  Studies/Results: No results found.  Anti-infectives: Anti-infectives     Start     Dose/Rate Route Frequency Ordered Stop   11/19/10 0600   erythromycin 250 mg in sodium chloride 0.9 % 100 mL IVPB        250 mg 100 mL/hr over 60 Minutes Intravenous  Once 11/19/10 0550 11/19/10 0724          Assessment/Plan: s/p Procedure(s): EXPLORATORY LAPAROTOMY GASTRECTOMY GASTROJEJUNOSTOMY Plan UGI today to check anast. If ok will d/c NG and begin clears.  Hg still down.  Will transfuse another 2 units.  Clinically not acting like symptomatic bleed.    LOS: 5 days    Karla Anderson C 11/23/2010

## 2010-11-23 NOTE — Progress Notes (Signed)
Patient refused to ambulate even though importance of ambulation has been thoroughly explained.  She did get up to the [facility], but otherwise would not walk in halls.

## 2010-11-23 NOTE — Progress Notes (Signed)
Patient had several low blood sugars that required hypoglycemia protocol treatment.  At 2027 her cbg was 69.  Patient was given 1/2 ampule of D50 and blood sugar went up to 85 by 2203.  At 2351, patient's blood sugar was 66 and she was given another 1/2 ampule of D50 and her blood sugar rose to 86 by 0023.  At 0413 patient's blood sugar was 62, she was given another 1/2 ampule of D50 and when she was checked at 0540, her blood sugar was only 64.  Patient was given another 1/2 ampule of D50 and was checked again at 0605 and her blood sugar had risen to 76 at that time.  Patient denies having any symptoms of low blood sugar.  P.J. Henderson Newcomer, RN

## 2010-11-24 LAB — CBC
HCT: 29.2 % — ABNORMAL LOW (ref 36.0–46.0)
MCH: 29.7 pg (ref 26.0–34.0)
MCHC: 33.6 g/dL (ref 30.0–36.0)
MCV: 88.5 fL (ref 78.0–100.0)
RDW: 16.8 % — ABNORMAL HIGH (ref 11.5–15.5)

## 2010-11-24 LAB — GLUCOSE, CAPILLARY
Glucose-Capillary: 108 mg/dL — ABNORMAL HIGH (ref 70–99)
Glucose-Capillary: 72 mg/dL (ref 70–99)
Glucose-Capillary: 81 mg/dL (ref 70–99)
Glucose-Capillary: 85 mg/dL (ref 70–99)
Glucose-Capillary: 93 mg/dL (ref 70–99)
Glucose-Capillary: 95 mg/dL (ref 70–99)

## 2010-11-24 LAB — COMPREHENSIVE METABOLIC PANEL
Albumin: 1.7 g/dL — ABNORMAL LOW (ref 3.5–5.2)
BUN: 14 mg/dL (ref 6–23)
Calcium: 7.7 mg/dL — ABNORMAL LOW (ref 8.4–10.5)
Creatinine, Ser: 0.65 mg/dL (ref 0.50–1.10)
Total Bilirubin: 0.6 mg/dL (ref 0.3–1.2)
Total Protein: 4.9 g/dL — ABNORMAL LOW (ref 6.0–8.3)

## 2010-11-24 MED ORDER — POTASSIUM CHLORIDE 10 MEQ/100ML IV SOLN
10.0000 meq | INTRAVENOUS | Status: AC
  Administered 2010-11-24 (×6): 10 meq via INTRAVENOUS
  Filled 2010-11-24 (×6): qty 100

## 2010-11-24 MED ORDER — SODIUM CHLORIDE 0.9 % IJ SOLN
INTRAMUSCULAR | Status: AC
  Filled 2010-11-24: qty 10

## 2010-11-24 MED ORDER — KCL IN DEXTROSE-NACL 40-5-0.45 MEQ/L-%-% IV SOLN
INTRAVENOUS | Status: DC
  Administered 2010-11-24: 11:00:00 via INTRAVENOUS

## 2010-11-24 NOTE — Progress Notes (Signed)
CRITICAL VALUE ALERT  Critical value received:  Potassium 2.7  Date of notification:11/24/10  Time of notification:   0725  Critical value read back:yes  Nurse who received alert: L.Basilia Jumbo, RN  MD notified (1st page): Dr. Lovell Sheehan  Time of first page: 0826 MD notified (2nd page):  Time of second page:  Responding MD:  Dr. Lovell Sheehan  Time MD responded:  [phone]

## 2010-11-24 NOTE — Progress Notes (Signed)
5 Days Post-Op  Subjective: Feels good. No incisional pain noted. No nausea or vomiting noted. Upper GI series done yesterday shows no anastomotic leak.  Objective: Vital signs in last 24 hours: Temp:  [97 F (36.1 C)-98.7 F (37.1 C)] 98.1 F (36.7 C) (08/25 0500) Pulse Rate:  [105-129] 105  (08/25 0500) Resp:  [18-24] 20  (08/25 0500) BP: (110-149)/(73-87) 149/85 mmHg (08/25 0500) SpO2:  [99 %-100 %] 100 % (08/25 0500) Last BM Date: 11/23/10  Intake/Output from previous day: 08/24 0701 - 08/25 0700 In: 1999.2 [I.V.:1376.7; Blood:612.5; IV Piggyback:10] Out: 3350 [Urine:3000; Emesis/NG output:250; Drains:100] Intake/Output this shift:    General appearance: alert, cooperative and no distress Resp: clear to auscultation bilaterally Cardio: regular rate and rhythm, S1, S2 normal, no murmur, click, rub or gallop. Mild sinus tachycardia noted. Abdomen: Soft. Incision healing well. Jackson-Pratt drain with serous drainage. Lab Results:   [facility] 11/24/10 0553 11/23/10 0528  WBC 10.7* 9.7  HGB 9.8* 7.3*  HCT 29.2* 21.6*  PLT 303 263   BMET  Basename 11/24/10 0553 11/23/10 0528  NA 138 139  K 2.7* 3.2*  CL 106 109  CO2 24 24  GLUCOSE 110* 92  BUN 14 22  CREATININE 0.65 0.72  CALCIUM 7.7* 7.8*   Studies/Results: Chest Portable 1 View Post Insertion To Confirm Placement As Interpreted By Radiologist  11/23/2010  *RADIOLOGY REPORT*  Clinical Data: Left PICC placement.  PORTABLE CHEST - 1 VIEW  Comparison: 11/17/2009.  Findings: Poor inspiration.  This makes the heart size difficult to evaluate.  Clear lungs.  Left PICC tip poorly visualized in the region of the right atrium.  Unremarkable bones.  IMPRESSION:  1.  Right PICC tip poorly visualized in the region of the right atrium.  Retracting the PICC 1.5 cm would most likely place the tip at the cavoatrial junction. 2.  No acute abnormality.  Original Report Authenticated By: Darrol Angel, M.D.   Varney Biles Kayleen Memos W/water Sol  Cm  11/23/2010  *RADIOLOGY REPORT*  Clinical Data bleeding duodenal ulcer, post antrectomy and Roux-en- Y gastrojejunostomy, evaluate gastrojejunostomy anastomoses for leak  WATER SOLUBLE UPPER GI SERIES  Technique:  Single-column upper GI series was performed using water soluble contrast.  Fluoroscopy Time: 4.8 minutes  Contrast: Omnipaque-300  Comparison: None  Findings: Scout image demonstrates surgical drain right upper quadrant. Skin clips noted at midline with additional surgical clips right upper quadrant. Nasogastric tube extends into mid abdomen, suspect across gastrojejunostomy anastomosis.  The patient drank contrast and images were obtained. Mild gastroesophageal reflux was seen during exam though this could be related to the impaired lower esophageal sphincter function due to nasogastric tube passage. Gastric remnant distends normally without definite mass or wall thickening. With patient in a steep RPO position, contrast passes from stomach into a jejunal loop. Edema is seen at the gastrojejunostomy anastomosis and extending into the proximal portion of the visualized jejunal loop. No definite extravasation. Additional area of contrast is seen adjacent to the anastomosis which appears to represent a second limb of the gastrojejunostomy anastomosis, or less likely be related to native stomach adjacent to the anastomosis.  IMPRESSION: Patent gastrojejunostomy anastomosis with edema seen at the anastomosis and extending into the proximal portion of the visualized jejunal loop. No definite contrast extravasation identified.  Original Report Authenticated By: Lollie Marrow, M.D.    Anti-infectives: Anti-infectives     Start     Dose/Rate Route Frequency Ordered Stop   11/19/10 0600   erythromycin 250 mg in  sodium chloride 0.9 % 100 mL IVPB        250 mg 100 mL/hr over 60 Minutes Intravenous  Once 11/19/10 0550 11/19/10 0724          Assessment/Plan: s/p Procedure(s): EXPLORATORY  LAPAROTOMY GASTRECTOMY GASTROJEJUNOSTOMY Assessment: Patient is progressing well from surgery. Her hemoglobin responded with the transfusions of packed red blood cells. No anastomotic leak noted. She is tolerating clear liquid diet well. Plan: We'll advance to full liquid diet. Will supplement potassium for her hypokalemia. We'll start ambulating her in the hallway.  LOS: 6 days    Sharra Cayabyab A 11/24/2010

## 2010-11-25 LAB — TYPE AND SCREEN
ABO/RH(D): O POS
Unit division: 0

## 2010-11-25 LAB — BASIC METABOLIC PANEL
BUN: 9 mg/dL (ref 6–23)
Calcium: 7.3 mg/dL — ABNORMAL LOW (ref 8.4–10.5)
Creatinine, Ser: 0.6 mg/dL (ref 0.50–1.10)
GFR calc Af Amer: >60 mL/min (ref >60)
GFR calc non Af Amer: >60 mL/min (ref >60)
Glucose, Bld: 92 mg/dL (ref 70–99)

## 2010-11-25 LAB — CBC
HCT: 25.5 % — ABNORMAL LOW (ref 36.0–46.0)
Hemoglobin: 8.6 g/dL — ABNORMAL LOW (ref 12.0–15.0)
MCH: 29.9 pg (ref 26.0–34.0)
MCHC: 33.7 g/dL (ref 30.0–36.0)
MCV: 88.5 fL (ref 78.0–100.0)
Platelets: 246 10*3/uL (ref 150–400)
RBC: 2.88 MIL/uL — ABNORMAL LOW (ref 3.87–5.11)
RDW: 17.7 % — ABNORMAL HIGH (ref 11.5–15.5)
WBC: 10.7 10*3/uL — ABNORMAL HIGH (ref 4.0–10.5)

## 2010-11-25 LAB — GLUCOSE, CAPILLARY
Glucose-Capillary: 100 mg/dL — ABNORMAL HIGH (ref 70–99)
Glucose-Capillary: 100 mg/dL — ABNORMAL HIGH (ref 70–99)
Glucose-Capillary: 71 mg/dL (ref 70–99)
Glucose-Capillary: 86 mg/dL (ref 70–99)
Glucose-Capillary: 95 mg/dL (ref 70–99)
Glucose-Capillary: 98 mg/dL (ref 70–99)

## 2010-11-25 MED ORDER — SODIUM CHLORIDE 0.9 % IJ SOLN
INTRAMUSCULAR | Status: AC
  Filled 2010-11-25: qty 10

## 2010-11-25 MED ORDER — HYDROCODONE-ACETAMINOPHEN 5-325 MG PO TABS
1.0000 | ORAL_TABLET | Freq: Four times a day (QID) | ORAL | Status: DC | PRN
  Administered 2010-11-25: 2 via ORAL
  Filled 2010-11-25: qty 2

## 2010-11-25 MED ORDER — SODIUM CHLORIDE 0.9 % IJ SOLN
INTRAMUSCULAR | Status: AC
  Filled 2010-11-25: qty 20

## 2010-11-25 NOTE — Progress Notes (Signed)
6 Days Post-Op  Subjective: No complaints. Tolerating by mouth well. No nausea or vomiting. Had a bowel movement this morning.  Objective: Vital signs in last 24 hours: Temp:  [97.5 F (36.4 C)-98.7 F (37.1 C)] 98.3 F (36.8 C) (08/26 0500) Pulse Rate:  [104-116] 111  (08/26 0500) Resp:  [18-20] 18  (08/26 0500) BP: (125-157)/(75-92) 132/83 mmHg (08/26 0500) SpO2:  [98 %-100 %] 99 % (08/26 0500) Last BM Date: 11/24/10  Intake/Output from previous day: 08/25 0701 - 08/26 0700 In: 1404.3 [P.O.:680; I.V.:296.3; IV Piggyback:428] Out: 900 [Urine:900] Intake/Output this shift: I/O this shift: In: 100 [P.O.:100] Out: -   General appearance: alert, cooperative and no distress Resp: clear to auscultation bilaterally Cardio: regular rate and rhythm, S1, S2 normal, no murmur, click, rub or gallop Abdomen: Soft, nontender, nondistended. Jackson-Pratt drain with serous drainage, minimal. Incision healing well.  Lab Results:   Basename 11/25/10 0510 11/24/10 0553  WBC 10.7* 10.7*  HGB 8.6* 9.8*  HCT 25.5* 29.2*  PLT 246 303   BMET  Basename 11/25/10 0510 11/24/10 0553  NA 137 138  K 3.3* 2.7*  CL 107 106  CO2 23 24  GLUCOSE 92 110*  BUN 9 14  CREATININE 0.60 0.65  CALCIUM 7.3* 7.7*   PT/INR No results found for this basename: LABPROT:2,INR:2 in the last 72 hours  Studies/Results: Chest Portable 1 View Post Insertion To Confirm Placement As Interpreted By Radiologist  11/23/2010  *RADIOLOGY REPORT*  Clinical Data: Left PICC placement.  PORTABLE CHEST - 1 VIEW  Comparison: 11/17/2009.  Findings: Poor inspiration.  This makes the heart size difficult to evaluate.  Clear lungs.  Left PICC tip poorly visualized in the region of the right atrium.  Unremarkable bones.  IMPRESSION:  1.  Right PICC tip poorly visualized in the region of the right atrium.  Retracting the PICC 1.5 cm would most likely place the tip at the cavoatrial junction. 2.  No acute abnormality.  Original Report  Authenticated By: Darrol Angel, M.D.   Varney Biles Kayleen Memos W/water Sol Cm  11/23/2010  *RADIOLOGY REPORT*  Clinical Data bleeding duodenal ulcer, post antrectomy and Roux-en- Y gastrojejunostomy, evaluate gastrojejunostomy anastomoses for leak  WATER SOLUBLE UPPER GI SERIES  Technique:  Single-column upper GI series was performed using water soluble contrast.  Fluoroscopy Time: 4.8 minutes  Contrast: Omnipaque-300  Comparison: None  Findings: Scout image demonstrates surgical drain right upper quadrant. Skin clips noted at midline with additional surgical clips right upper quadrant. Nasogastric tube extends into mid abdomen, suspect across gastrojejunostomy anastomosis.  The patient drank contrast and images were obtained. Mild gastroesophageal reflux was seen during exam though this could be related to the impaired lower esophageal sphincter function due to nasogastric tube passage. Gastric remnant distends normally without definite mass or wall thickening. With patient in a steep RPO position, contrast passes from stomach into a jejunal loop. Edema is seen at the gastrojejunostomy anastomosis and extending into the proximal portion of the visualized jejunal loop. No definite extravasation. Additional area of contrast is seen adjacent to the anastomosis which appears to represent a second limb of the gastrojejunostomy anastomosis, or less likely be related to native stomach adjacent to the anastomosis.  IMPRESSION: Patent gastrojejunostomy anastomosis with edema seen at the anastomosis and extending into the proximal portion of the visualized jejunal loop. No definite contrast extravasation identified.  Original Report Authenticated By: Lollie Marrow, M.D.    Anti-infectives: Anti-infectives     Start     Dose/Rate  Route Frequency Ordered Stop   11/19/10 0600   erythromycin 250 mg in sodium chloride 0.9 % 100 mL IVPB        250 mg 100 mL/hr over 60 Minutes Intravenous  Once 11/19/10 0550 11/19/10 0724           Assessment/Plan: s/p Procedure(s): EXPLORATORY LAPAROTOMY GASTRECTOMY GASTROJEJUNOSTOMY  Assessment: Patient is progressing well. Still with anemia that may be secondary to both chronic disease and surgery. No need for blood transfusion at this time. Hypokalemia is resolving. Plan: Supplement potassium. Advance diet as tolerated. Check CBC in a.m.  LOS: 7 days    Bernadene Garside A 11/25/2010

## 2010-11-26 LAB — CBC
Platelets: 243 10*3/uL (ref 150–400)
RBC: 2.94 MIL/uL — ABNORMAL LOW (ref 3.87–5.11)
WBC: 12.5 10*3/uL — ABNORMAL HIGH (ref 4.0–10.5)

## 2010-11-26 LAB — CREATININE, SERUM
Creatinine, Ser: 0.59 mg/dL (ref 0.50–1.10)
GFR calc Af Amer: >60 mL/min (ref >60)
GFR calc non Af Amer: >60 mL/min (ref >60)

## 2010-11-26 LAB — GLUCOSE, CAPILLARY
Glucose-Capillary: 103 mg/dL — ABNORMAL HIGH (ref 70–99)
Glucose-Capillary: 141 mg/dL — ABNORMAL HIGH (ref 70–99)

## 2010-11-26 MED ORDER — LUBIPROSTONE 24 MCG PO CAPS
24.0000 ug | ORAL_CAPSULE | Freq: Every day | ORAL | Status: DC
  Administered 2010-11-26: 24 ug via ORAL
  Filled 2010-11-26 (×3): qty 1

## 2010-11-26 MED ORDER — TRAZODONE HCL 50 MG PO TABS: 150.0000 mg | ORAL_TABLET | Freq: Every day | ORAL | Status: DC

## 2010-11-26 MED ORDER — SERTRALINE HCL 50 MG PO TABS
100.0000 mg | ORAL_TABLET | Freq: Every day | ORAL | Status: DC
Start: 2010-11-26 — End: 2010-11-26
  Administered 2010-11-26: 100 mg via ORAL
  Filled 2010-11-26: qty 2

## 2010-11-26 MED ORDER — SODIUM CHLORIDE 0.9 % IJ SOLN
INTRAMUSCULAR | Status: AC
  Filled 2010-11-26: qty 10

## 2010-11-26 MED ORDER — HYDROCODONE-ACETAMINOPHEN 5-325 MG PO TABS: 1.0000 | ORAL_TABLET | ORAL | Status: AC | PRN

## 2010-11-26 MED ORDER — ZIPRASIDONE HCL 80 MG PO CAPS
160.0000 mg | ORAL_CAPSULE | Freq: Every day | ORAL | Status: DC
  Filled 2010-11-26 (×2): qty 2

## 2010-11-26 MED ORDER — QUETIAPINE FUMARATE 100 MG PO TABS: 600.0000 mg | ORAL_TABLET | Freq: Every day | ORAL | Status: DC

## 2010-11-26 MED ORDER — HYDROXYZINE HCL 25 MG PO TABS: 50.0000 mg | ORAL_TABLET | Freq: Four times a day (QID) | ORAL | Status: DC | PRN

## 2010-11-26 NOTE — Progress Notes (Signed)
7 Days Post-Op  Subjective: Doing well.  Tolerating PO.  No nausea.  +BM.    Objective: Vital signs in last 24 hours: Temp:  [98 F (36.7 C)] 98 F (36.7 C) (08/27 0535) Pulse Rate:  [107-117] 117  (08/27 0535) Resp:  [18-20] 18  (08/27 0535) BP: (121-124)/(83) 121/83 mmHg (08/27 0535) SpO2:  [96 %-99 %] 96 % (08/27 0535) Last BM Date: 11/25/10  Intake/Output from previous day: 08/26 0701 - 08/27 0700 In: 3505 [P.O.:580; I.V.:2925] Out: 905 [Urine:800; Drains:105] Intake/Output this shift: I/O this shift: In: 130 [P.O.:120; I.V.:10] Out: -   General appearance: alert and no distress GI: soft, non-tender; bowel sounds normal; no masses,  no organomegaly  Lab Results:  @LABLAST2 (wbc:2,hgb:2,hct:2,plt:2) BMET  Basename 11/26/10 0520 11/25/10 0510 11/24/10 0553  NA -- 137 138  K -- 3.3* 2.7*  CL -- 107 106  CO2 -- 23 24  GLUCOSE -- 92 110*  BUN -- 9 14  CREATININE 0.59 0.60 --  CALCIUM -- 7.3* 7.7*   PT/INR No results found for this basename: LABPROT:2,INR:2 in the last 72 hours ABG No results found for this basename: PHART:2,PCO2:2,PO2:2,HCO3:2 in the last 72 hours  Studies/Results: No results found.  Anti-infectives: Anti-infectives     Start     Dose/Rate Route Frequency Ordered Stop   11/19/10 0600   erythromycin 250 mg in sodium chloride 0.9 % 100 mL IVPB        250 mg 100 mL/hr over 60 Minutes Intravenous  Once 11/19/10 0550 11/19/10 0724          Assessment/Plan: s/p Procedure(s): EXPLORATORY LAPAROTOMY GASTRECTOMY GASTROJEJUNOSTOMY Doing very well.  If tolerates reg diet for lunch will plan to d/c later this afternoon.  Will d/c with drain as this is adjacent to the Duodenal stump.  LOS: 8 days    Sami Roes C 11/26/2010

## 2010-11-26 NOTE — Progress Notes (Signed)
Pt discharged home today per Dr. Leticia Penna. Pt's PICC line D/C'd by Arlyss Queen, RN and WNL. Pt's VS stable at this time. Pt provided with home medication list and discharge instructions. Pt verbalized understanding. Pt left floor via WC in stable condition accompanied by Dagoberto Ligas, RN. Dagoberto Ligas, RN

## 2010-12-01 ENCOUNTER — Telehealth (INDEPENDENT_AMBULATORY_CARE_PROVIDER_SITE_OTHER): Payer: Self-pay | Admitting: Internal Medicine

## 2010-12-01 NOTE — Telephone Encounter (Signed)
[name] called me last evening and planing of nausea and vomiting. She denied abdominal pain hematemesis or melena. He just had surgery for peptic ulcer disease and went home earlier this week. She was advised to stop Zantac. Prescription called in for pantoprazole 40 mg twice daily 60 with 2 refills Patient called in for Zofran 4 mg twice a day when necessary 12 doses. Is advised to go to emergency room if her nausea vomiting persist. He has an appointment to see Dr. Leticia Penna next week. Patient also needs to have H. pylori serology drawn at some point. Will asked Dr. Leticia Penna if it could be done on her visit with him

## 2010-12-06 NOTE — Discharge Summary (Signed)
Date of Admission:  11/18/10  Date of Discharge:  11/26/10  Admission Dx:  GI bleed  Discharge Dx:  Ex-lap, partial gastrectomy with Roux-en-Y Gastro-jejunostomy  Disposition:  Home.  Brief H&P:  Patient was admitted to [facility] after episode of dizziness and LOC.  Work-up demonstrated anemia. She was admitted for continued work-up and intervention.  Hospital Course:  Patient was admitted to the hospitalist service on 11/18/10.  She received pRBCs for anemia and underwent upper endoscopy the following AM.  She was noted to have a large bleeding duodenal ulcer which was not amendable to endoscopic intervention.  She was therefore taken to the OR and underwent the above mentioned procedure.  She tolerated this well.  Spent a short time in the ICU and was eventually transferred to a regular surgical floor.  On POD #4 she underwent UGI contrast evaluation which demonstrated no evidence of an anastomotic leak.  She was started on a diet.  This was advanced.  On 11/26/10, she was tolerating regular diet, pain was controlled, and she was having regular BMs.  Plans were made for d/c to home with drain in place.  She was advised on activity and diet.  She is to f/u in my office in 1 week.

## 2010-12-20 ENCOUNTER — Other Ambulatory Visit (HOSPITAL_COMMUNITY): Payer: Self-pay | Admitting: General Surgery

## 2010-12-20 ENCOUNTER — Ambulatory Visit (HOSPITAL_COMMUNITY)
Admission: RE | Admit: 2010-12-20 | Discharge: 2010-12-20 | Disposition: A | Discharge status: Home / Self Care | Payer: Medicaid Other | Source: Ambulatory Visit | Attending: General Surgery | Admitting: General Surgery

## 2010-12-20 DIAGNOSIS — I82409 Acute embolism and thrombosis of unspecified deep veins of unspecified lower extremity: Secondary | ICD-10-CM

## 2010-12-20 DIAGNOSIS — M79609 Pain in unspecified limb: Secondary | ICD-10-CM | POA: Insufficient documentation

## 2010-12-20 DIAGNOSIS — M7989 Other specified soft tissue disorders: Secondary | ICD-10-CM | POA: Insufficient documentation

## 2010-12-24 ENCOUNTER — Other Ambulatory Visit (INDEPENDENT_AMBULATORY_CARE_PROVIDER_SITE_OTHER): Payer: Self-pay | Admitting: Internal Medicine

## 2011-07-03 ENCOUNTER — Other Ambulatory Visit: Payer: Self-pay

## 2011-07-03 ENCOUNTER — Encounter (HOSPITAL_COMMUNITY): Payer: Self-pay | Admitting: *Deleted

## 2011-07-03 ENCOUNTER — Inpatient Hospital Stay (HOSPITAL_COMMUNITY)
Admission: EM | Admit: 2011-07-03 | Discharge: 2011-07-09 | DRG: 292 | Disposition: A | Discharge status: Home Health | Payer: Medicaid Other | Attending: Internal Medicine | Admitting: Internal Medicine

## 2011-07-03 ENCOUNTER — Emergency Department (HOSPITAL_COMMUNITY): Payer: Medicaid Other

## 2011-07-03 DIAGNOSIS — I5021 Acute systolic (congestive) heart failure: Principal | ICD-10-CM | POA: Diagnosis present

## 2011-07-03 DIAGNOSIS — Z6841 Body Mass Index (BMI) 40.0 and over, adult: Secondary | ICD-10-CM

## 2011-07-03 DIAGNOSIS — F319 Bipolar disorder, unspecified: Secondary | ICD-10-CM | POA: Diagnosis present

## 2011-07-03 DIAGNOSIS — I10 Essential (primary) hypertension: Secondary | ICD-10-CM

## 2011-07-03 DIAGNOSIS — M1710 Unilateral primary osteoarthritis, unspecified knee: Secondary | ICD-10-CM | POA: Diagnosis present

## 2011-07-03 DIAGNOSIS — R9431 Abnormal electrocardiogram [ECG] [EKG]: Secondary | ICD-10-CM

## 2011-07-03 DIAGNOSIS — I129 Hypertensive chronic kidney disease with stage 1 through stage 4 chronic kidney disease, or unspecified chronic kidney disease: Secondary | ICD-10-CM | POA: Diagnosis present

## 2011-07-03 DIAGNOSIS — R0600 Dyspnea, unspecified: Secondary | ICD-10-CM

## 2011-07-03 DIAGNOSIS — N179 Acute kidney failure, unspecified: Secondary | ICD-10-CM

## 2011-07-03 DIAGNOSIS — I428 Other cardiomyopathies: Secondary | ICD-10-CM | POA: Diagnosis present

## 2011-07-03 DIAGNOSIS — R609 Edema, unspecified: Secondary | ICD-10-CM

## 2011-07-03 DIAGNOSIS — I509 Heart failure, unspecified: Secondary | ICD-10-CM | POA: Diagnosis present

## 2011-07-03 DIAGNOSIS — N189 Chronic kidney disease, unspecified: Secondary | ICD-10-CM | POA: Diagnosis present

## 2011-07-03 DIAGNOSIS — I429 Cardiomyopathy, unspecified: Secondary | ICD-10-CM

## 2011-07-03 LAB — COMPREHENSIVE METABOLIC PANEL
ALT: 12 U/L (ref 0–35)
Calcium: 8.6 mg/dL (ref 8.4–10.5)
Creatinine, Ser: 1.27 mg/dL — ABNORMAL HIGH (ref 0.50–1.10)
GFR calc Af Amer: 61 mL/min — ABNORMAL LOW (ref >90)
Glucose, Bld: 106 mg/dL — ABNORMAL HIGH (ref 70–99)
Sodium: 137 mEq/L (ref 135–145)
Total Protein: 6.2 g/dL (ref 6.0–8.3)

## 2011-07-03 LAB — URINALYSIS, ROUTINE W REFLEX MICROSCOPIC
Glucose, UA: NEGATIVE mg/dL
Hgb urine dipstick: NEGATIVE
Specific Gravity, Urine: >1.03 — ABNORMAL HIGH (ref 1.005–1.030)
pH: 6 (ref 5.0–8.0)

## 2011-07-03 LAB — TROPONIN I: Troponin I: <0.3 ng/mL (ref <0.30)

## 2011-07-03 LAB — URINE MICROSCOPIC-ADD ON

## 2011-07-03 LAB — PRO B NATRIURETIC PEPTIDE: Pro B Natriuretic peptide (BNP): 10548 pg/mL — ABNORMAL HIGH (ref 0–125)

## 2011-07-03 LAB — CBC
MCH: 27.6 pg (ref 26.0–34.0)
MCV: 86.9 fL (ref 78.0–100.0)
Platelets: 251 10*3/uL (ref 150–400)
RDW: 15.9 % — ABNORMAL HIGH (ref 11.5–15.5)

## 2011-07-03 LAB — CARDIAC PANEL(CRET KIN+CKTOT+MB+TROPI)
Relative Index: 1.8 (ref 0.0–2.5)
Troponin I: <0.3 ng/mL (ref <0.30)

## 2011-07-03 LAB — POCT PREGNANCY, URINE: Preg Test, Ur: NEGATIVE

## 2011-07-03 LAB — DIFFERENTIAL
Eosinophils Absolute: 0.1 10*3/uL (ref 0.0–0.7)
Eosinophils Relative: 1 % (ref 0–5)
Lymphs Abs: 1.9 10*3/uL (ref 0.7–4.0)
Monocytes Absolute: 0.5 10*3/uL (ref 0.1–1.0)

## 2011-07-03 MED ORDER — OXYCODONE HCL 5 MG PO TABS
5.0000 mg | ORAL_TABLET | ORAL | Status: DC | PRN
  Administered 2011-07-03 – 2011-07-09 (×11): 5 mg via ORAL
  Filled 2011-07-03 (×11): qty 1

## 2011-07-03 MED ORDER — CARVEDILOL 3.125 MG PO TABS
3.1250 mg | ORAL_TABLET | Freq: Two times a day (BID) | ORAL | Status: DC
  Administered 2011-07-03 – 2011-07-09 (×11): 3.125 mg via ORAL
  Filled 2011-07-03 (×11): qty 1

## 2011-07-03 MED ORDER — PNEUMOCOCCAL VAC POLYVALENT 25 MCG/0.5ML IJ INJ
0.5000 mL | INJECTION | INTRAMUSCULAR | Status: AC
  Administered 2011-07-04: 0.5 mL via INTRAMUSCULAR
  Filled 2011-07-03: qty 0.5

## 2011-07-03 MED ORDER — FUROSEMIDE 10 MG/ML IJ SOLN
40.0000 mg | Freq: Two times a day (BID) | INTRAMUSCULAR | Status: DC
  Administered 2011-07-03 – 2011-07-06 (×6): 40 mg via INTRAVENOUS
  Filled 2011-07-03 (×6): qty 4

## 2011-07-03 MED ORDER — ONDANSETRON HCL 4 MG/2ML IJ SOLN
4.0000 mg | INTRAMUSCULAR | Status: DC | PRN
  Administered 2011-07-04 – 2011-07-07 (×3): 4 mg via INTRAVENOUS
  Filled 2011-07-03 (×3): qty 2

## 2011-07-03 MED ORDER — ZIPRASIDONE HCL 40 MG PO CAPS
ORAL_CAPSULE | ORAL | Status: AC
  Filled 2011-07-03: qty 1

## 2011-07-03 MED ORDER — SERTRALINE HCL 50 MG PO TABS
150.0000 mg | ORAL_TABLET | Freq: Every day | ORAL | Status: DC
  Administered 2011-07-03 – 2011-07-09 (×7): 150 mg via ORAL
  Filled 2011-07-03 (×7): qty 3

## 2011-07-03 MED ORDER — POTASSIUM CHLORIDE CRYS ER 20 MEQ PO TBCR
40.0000 meq | EXTENDED_RELEASE_TABLET | Freq: Two times a day (BID) | ORAL | Status: DC
  Administered 2011-07-03 – 2011-07-09 (×12): 40 meq via ORAL
  Filled 2011-07-03 (×12): qty 2

## 2011-07-03 MED ORDER — TECHNETIUM TO 99M ALBUMIN AGGREGATED
3.0000 | Freq: Once | INTRAVENOUS | Status: AC | PRN
  Administered 2011-07-03: 3.3 via INTRAVENOUS

## 2011-07-03 MED ORDER — ENOXAPARIN SODIUM 40 MG/0.4ML ~~LOC~~ SOLN
40.0000 mg | SUBCUTANEOUS | Status: DC
  Administered 2011-07-03 – 2011-07-04 (×2): 40 mg via SUBCUTANEOUS
  Filled 2011-07-03 (×2): qty 0.4

## 2011-07-03 MED ORDER — SODIUM CHLORIDE 0.9 % IJ SOLN
3.0000 mL | Freq: Two times a day (BID) | INTRAMUSCULAR | Status: DC
  Administered 2011-07-03 – 2011-07-08 (×11): 3 mL via INTRAVENOUS
  Filled 2011-07-03 (×9): qty 3

## 2011-07-03 MED ORDER — XENON XE 133 GAS
10.0000 | GAS_FOR_INHALATION | Freq: Once | RESPIRATORY_TRACT | Status: AC | PRN
  Administered 2011-07-03: 8 via RESPIRATORY_TRACT

## 2011-07-03 MED ORDER — HYDRALAZINE HCL 25 MG PO TABS
25.0000 mg | ORAL_TABLET | Freq: Three times a day (TID) | ORAL | Status: DC
  Administered 2011-07-03 – 2011-07-05 (×4): 25 mg via ORAL
  Filled 2011-07-03 (×4): qty 1

## 2011-07-03 MED ORDER — ISOSORBIDE DINITRATE 20 MG PO TABS
10.0000 mg | ORAL_TABLET | Freq: Three times a day (TID) | ORAL | Status: DC
  Administered 2011-07-03 – 2011-07-05 (×5): 10 mg via ORAL
  Filled 2011-07-03 (×5): qty 1

## 2011-07-03 MED ORDER — QUETIAPINE FUMARATE 100 MG PO TABS
600.0000 mg | ORAL_TABLET | Freq: Every day | ORAL | Status: DC
  Administered 2011-07-03 – 2011-07-08 (×6): 600 mg via ORAL
  Filled 2011-07-03 (×6): qty 6

## 2011-07-03 MED ORDER — ZOLPIDEM TARTRATE 5 MG PO TABS
10.0000 mg | ORAL_TABLET | Freq: Every day | ORAL | Status: DC
  Administered 2011-07-03 – 2011-07-08 (×6): 10 mg via ORAL
  Filled 2011-07-03 (×6): qty 2

## 2011-07-03 MED ORDER — FUROSEMIDE 10 MG/ML IJ SOLN
40.0000 mg | Freq: Once | INTRAMUSCULAR | Status: AC
  Administered 2011-07-03: 40 mg via INTRAVENOUS
  Filled 2011-07-03: qty 4

## 2011-07-03 MED ORDER — ACETAMINOPHEN 325 MG PO TABS: 650.0000 mg | ORAL_TABLET | ORAL | Status: DC | PRN

## 2011-07-03 MED ORDER — SODIUM CHLORIDE 0.9 % IJ SOLN
3.0000 mL | INTRAMUSCULAR | Status: DC | PRN
  Filled 2011-07-03 (×2): qty 3

## 2011-07-03 MED ORDER — TRAZODONE HCL 50 MG PO TABS
150.0000 mg | ORAL_TABLET | Freq: Every day | ORAL | Status: DC
  Administered 2011-07-03 – 2011-07-08 (×6): 150 mg via ORAL
  Filled 2011-07-03 (×6): qty 3

## 2011-07-03 MED ORDER — CYCLOBENZAPRINE HCL 10 MG PO TABS: 10.0000 mg | ORAL_TABLET | Freq: Three times a day (TID) | ORAL | Status: DC | PRN

## 2011-07-03 MED ORDER — ZIPRASIDONE HCL 60 MG PO CAPS
ORAL_CAPSULE | ORAL | Status: AC
  Filled 2011-07-03: qty 2

## 2011-07-03 MED ORDER — ZIPRASIDONE HCL 80 MG PO CAPS
160.0000 mg | ORAL_CAPSULE | Freq: Every day | ORAL | Status: DC
  Administered 2011-07-03 – 2011-07-08 (×6): 160 mg via ORAL
  Filled 2011-07-03 (×7): qty 2

## 2011-07-03 NOTE — ED Provider Notes (Signed)
History     CSN: [id number]  Arrival date & time 07/03/11  1302   First MD Initiated Contact with Patient 07/03/11 1326      Chief Complaint  Patient presents with  . Cellulitis    HPI Pt was seen at 1335.  Per pt, c/o gradual onset and persistence of constant SOB and bilateral pedal edema for the past 2 weeks.  Pt was eval by [facility], then was sent to the ED for eval and admission for "possible heart failure."  Denies CP/palpitations, no abd pain, no back pain, no rash, no fevers.    PMD:  [facility]. Past Medical History  Diagnosis Date  . Tachycardia   . Anxiety   . Depression   . Heart murmur   . Back pain   . Bipolar 1 disorder   . Arthritis     bil knee    Past Surgical History  Procedure Date  . Cesarean section   . Carpel tunn Carpel tunnel sx  . Tubal ligation   . Tubal ligation reversal   . Cholecystectomy 11/09/2010    Procedure: LAPAROSCOPIC CHOLECYSTECTOMY;  Surgeon: Fabio Bering;  Location: [facility] ORS;  Service: General;  Laterality: N/A;  . Esophagogastroduodenoscopy 11/19/2010    Procedure: ESOPHAGOGASTRODUODENOSCOPY (EGD);  Surgeon: Malissa Hippo, MD;  Location: [facility] END[facility] SUITE;  Service: Endoscopy;  Laterality: N/A;  . Laparotomy 11/19/2010    Procedure: EXPLORATORY LAPAROTOMY;  Surgeon: Fabio Bering;  Location: [facility] ORS;  Service: General;  Laterality: N/A;  . Gastrectomy 11/19/2010    Procedure: GASTRECTOMY;  Surgeon: Fabio Bering;  Location: [facility] ORS;  Service: General;  Laterality: N/A;  Partial Gastrectomy  . Gastrojejunostomy 11/19/2010    Procedure: GASTROJEJUNOSTOMY;  Surgeon: Fabio Bering;  Location: [facility] ORS;  Service: General;  Laterality: N/A;  Roux-En-Y    Family History  Problem Relation Age of Onset  . Stroke Father     Died recently, Age 44  . Heart failure Mother   . Diabetes Sister   . Pneumonia Father   . Coronary artery disease Father   . Parkinsonism Mother     History  Substance Use Topics  . Smoking status:  Never Smoker   . Smokeless tobacco: Not on file  . Alcohol Use: No    Review of Systems ROS: Statement: All systems negative except as marked or noted in the HPI; Constitutional: Negative for fever and chills. ; ; Eyes: Negative for eye pain, redness and discharge. ; ; ENMT: Negative for ear pain, hoarseness, nasal congestion, sinus pressure and sore throat. ; ; Cardiovascular: Negative for chest pain, palpitations, diaphoresis, +dyspnea and peripheral edema. ; ; Respiratory: Negative for cough, wheezing and stridor. ; ; Gastrointestinal: Negative for nausea, vomiting, diarrhea, abdominal pain, blood in stool, hematemesis, jaundice and rectal bleeding. . ; ; Genitourinary: Negative for dysuria, flank pain and hematuria. ; ; Musculoskeletal: Negative for back pain and neck pain. Negative for swelling and trauma.; ; Skin: Negative for pruritus, rash, abrasions, blisters, bruising and skin lesion.; ; Neuro: Negative for headache, lightheadedness and neck stiffness. Negative for weakness, altered level of consciousness , altered mental status, extremity weakness, paresthesias, involuntary movement, seizure and syncope.     Allergies  Review of patient's allergies indicates no known allergies.  Home Medications   Current Outpatient Rx  Name Route Sig Dispense Refill  . CYCLOBENZAPRINE HCL 10 MG PO TABS Oral Take 10 mg by mouth 3 (three) times daily as needed. FOR  MUSCLE SPASMS    . HYDROCODONE-ACETAMINOPHEN 5-325 MG PO TABS Oral Take 1 tablet by mouth every 6 (six) hours as needed. FOR PAIN    . ADULT ONE DAILY GUMMIES PO CHEW Oral Chew 2 each by mouth every morning.    Marland Kitchen QUETIAPINE FUMARATE 300 MG PO TABS Oral Take 600 mg by mouth at bedtime.      . SERTRALINE HCL 100 MG PO TABS Oral Take 150 mg by mouth daily.     . TRAZODONE HCL 150 MG PO TABS Oral Take 150 mg by mouth at bedtime.      Marland Kitchen ZIPRASIDONE HCL 80 MG PO CAPS Oral Take 160 mg by mouth at bedtime.      Marland Kitchen ZOLPIDEM TARTRATE 10 MG PO TABS  Oral Take 10 mg by mouth at bedtime.        BP 136/94  Pulse 113  Temp(Src) 97.7 F (36.5 C) (Oral)  Resp 20  Ht 5\' 5"  (1.651 m)  Wt 290 lb (131.543 kg)  BMI 48.26 kg/m2  SpO2 96%  Physical Exam 1340: Physical examination:  Nursing notes reviewed; Vital signs and O2 SAT reviewed;  Constitutional: Well developed, Well nourished, Well hydrated, In no acute distress; Head:  Normocephalic, atraumatic; Eyes: EOMI, PERRL, No scleral icterus; ENMT: Mouth and pharynx normal, Mucous membranes moist; Neck: Supple, Full range of motion, No lymphadenopathy; Cardiovascular: Tachycardic rate and rhythm, No gallop; Respiratory: Breath sounds clear & equal bilaterally, No wheezes, Normal respiratory effort/excursion, speaking full sentences with ease; Chest: Nontender, Movement normal; Abdomen: Soft, Nontender, Nondistended, Normal bowel sounds; Extremities: Pulses normal, No tenderness, 3+ pitting pedal edema from feet to knees bilat without calf asymmetry, +mild erythematous rash to RLE.; Neuro: AA&Ox3, Major CN grossly intact.  No gross focal motor or sensory deficits in extremities.; Skin: Color normal, Warm, Dry   ED Course  Procedures  MDM  MDM Reviewed: nursing note, vitals and previous chart Reviewed previous: ECG Interpretation: labs, x-ray and ECG    Date: 07/03/2011  Rate: 116  Rhythm: sinus tachycardia  QRS Axis: left  Intervals: normal  ST/T Wave abnormalities: nonspecific ST/T changes  Conduction Disutrbances:nonspecific intraventricular conduction delay  Narrative Interpretation:   Old EKG Reviewed: changes noted; IVCD new since previous EKG dated 08/20/1998.  Results for orders placed during the hospital encounter of 07/03/11  CBC      Component Value Range   WBC 7.5  4.0 - 10.5 (K/uL)   RBC 4.42  3.87 - 5.11 (MIL/uL)   Hemoglobin 12.2  12.0 - 15.0 (g/dL)   HCT 16.1  09.6 - 04.5 (%)   MCV 86.9  78.0 - 100.0 (fL)   MCH 27.6  26.0 - 34.0 (pg)   MCHC 31.8  30.0 - 36.0  (g/dL)   RDW 40.9 (*) 81.1 - 15.5 (%)   Platelets 251  150 - 400 (K/uL)  DIFFERENTIAL      Component Value Range   Neutrophils Relative 66  43 - 77 (%)   Neutro Abs 4.9  1.7 - 7.7 (K/uL)   Lymphocytes Relative 26  12 - 46 (%)   Lymphs Abs 1.9  0.7 - 4.0 (K/uL)   Monocytes Relative 7  3 - 12 (%)   Monocytes Absolute 0.5  0.1 - 1.0 (K/uL)   Eosinophils Relative 1  0 - 5 (%)   Eosinophils Absolute 0.1  0.0 - 0.7 (K/uL)   Basophils Relative 0  0 - 1 (%)   Basophils Absolute 0.0  0.0 - 0.1 (K/uL)  COMPREHENSIVE METABOLIC PANEL      Component Value Range   Sodium 137  135 - 145 (mEq/L)   Potassium 4.3  3.5 - 5.1 (mEq/L)   Chloride 100  96 - 112 (mEq/L)   CO2 26  19 - 32 (mEq/L)   Glucose, Bld 106 (*) 70 - 99 (mg/dL)   BUN 14  6 - 23 (mg/dL)   Creatinine, Ser 1.61 (*) 0.50 - 1.10 (mg/dL)   Calcium 8.6  8.4 - 09.6 (mg/dL)   Total Protein 6.2  6.0 - 8.3 (g/dL)   Albumin 2.8 (*) 3.5 - 5.2 (g/dL)   AST 23  0 - 37 (U/L)   ALT 12  0 - 35 (U/L)   Alkaline Phosphatase 159 (*) 39 - 117 (U/L)   Total Bilirubin 0.7  0.3 - 1.2 (mg/dL)   GFR calc non Af Amer 52 (*) >90 (mL/min)   GFR calc Af Amer 61 (*) >90 (mL/min)  PRO B NATRIURETIC PEPTIDE      Component Value Range   Pro B Natriuretic peptide (BNP) 10548.0 (*) 0 - 125 (pg/mL)  TROPONIN I      Component Value Range   Troponin I <0.30  <0.30 (ng/mL)  POCT PREGNANCY, URINE      Component Value Range   Preg Test, Ur NEGATIVE  NEGATIVE     Dg Chest Port 1 View 07/03/2011  *RADIOLOGY REPORT*  Clinical Data: Cough.  Congestive heart failure.  Short of breath.  PORTABLE CHEST - 1 VIEW  Comparison: 11/23/2010  Findings: Cardiac silhouette remains enlarged.  Film is under penetrated.  Allowing for that, I think the lungs are clear. Cannot completely rule out basilar lung disease, particularly on the left.  IMPRESSION: No active disease seen.  Enlarged cardiac silhouette.  Poor visualization of the left lung base because of technical factors.   Original Report Authenticated By: Thomasenia Sales, M.D.    Results for HIEN, CUNLIFFE (MRN [id number]) as of 07/03/2011 16:02  Ref. Range 11/23/2010 05:28 11/24/2010 05:53 11/25/2010 05:10 11/26/2010 05:20 07/03/2011 13:46  BUN Latest Range: 6-23 mg/dL 22 14 9  14   Creat Latest Range: 0.50-1.10 mg/dL 9.14 7.82 9.56 2.13 0.86 (H)     6:06 PM:  V/Q scan read as very low prob of PE (did not cross over into EPIC).  BNP very elevated, no old to compare.  Will tx with IV lasix.  Denies CP.  Dx testing d/w pt and family.  Questions answered.  Verb understanding, agreeable to admit.  T/C to [facility] Dr. Kerry Hough, case discussed, including:  HPI, pertinent PM/SHx, VS/PE, dx testing, ED course and treatment:  Agreeable to admit, requests to write temporary orders, obtain tele bed to team 2.           Laray Anger, DO 07/06/11 0004

## 2011-07-03 NOTE — H&P (Signed)
PCP:   No primary provider on file.   Chief Complaint:  Shortness of breath for 2 weeks  HPI: Karla Anderson is an 41 y.o. female.   Morbidly obese Caucasian lady with a past history of surgery for perforated peptic ulcer due to NSAID use;  bipolar disorder, denies history of heart disease or kidney disease, presents with a two-week history of shortness of breath with exertion.  She denies orthopnea or PND. In fact at baseline she sleeps propped up on 5 pillows, but because of the progressive and massive lower extremity swelling for the past 2 weeks, she feels better propped up on only 3 pillows, using the other 2 to elevate her legs.  He denies fever cough or cold she denies chest pain or dyspnea at rest;  Swelling of her legs have become so extensive that the skin is nursing and weeping. Swelling extends to her abdomen, and her left breast is also becoming swollen.  Review of her labs shows that her baseline creatinine is about 0.65;   Rewiew of Systems:  The patient denies anorexia, fever, weight loss,, vision loss, decreased hearing, hoarseness, chest pain, syncope,   balance deficits, hemoptysis, abdominal pain, melena, hematochezia, severe indigestion/heartburn, hematuria, incontinence, genital sores, muscle weakness, suspicious skin lesions, transient blindness, difficulty walking, depression,  abnormal bleeding, enlarged lymph nodes, angioedema, and breast masses.    Past Medical History  Diagnosis Date  . Tachycardia   . Anxiety   . Depression   . Heart murmur   . Back pain   . Bipolar 1 disorder   . Arthritis     bil knee    Past Surgical History  Procedure Date  . Cesarean section   . Carpel tunn Carpel tunnel sx  . Tubal ligation   . Tubal ligation reversal   . Cholecystectomy 11/09/2010    Procedure: LAPAROSCOPIC CHOLECYSTECTOMY;  Surgeon: Fabio Bering;  Location: [facility];  Service: General;  Laterality: N/A;  . Esophagogastroduodenoscopy 11/19/2010    Procedure:  ESOPHAGOGASTRODUODENOSCOPY (EGD);  Surgeon: Malissa Hippo, MD;  Location: [facility];  Service: Endoscopy;  Laterality: N/A;  . Laparotomy 11/19/2010    Procedure: EXPLORATORY LAPAROTOMY;  Surgeon: Fabio Bering;  Location: [facility];  Service: General;  Laterality: N/A;  . Gastrectomy 11/19/2010    Procedure: GASTRECTOMY;  Surgeon: Fabio Bering;  Location: [facility];  Service: General;  Laterality: N/A;  Partial Gastrectomy  . Gastrojejunostomy 11/19/2010    Procedure: GASTROJEJUNOSTOMY;  Surgeon: Fabio Bering;  Location: [facility];  Service: General;  Laterality: N/A;  Roux-En-Y    Medications:  HOME MEDS: Prior to Admission medications   Medication Sig Start Date End Date Taking? Authorizing Provider  cyclobenzaprine (FLEXERIL) 10 MG tablet Take 10 mg by mouth 3 (three) times daily as needed. FOR MUSCLE SPASMS   Yes Historical Provider, MD  HYDROcodone-acetaminophen (NORCO) 5-325 MG per tablet Take 1 tablet by mouth every 6 (six) hours as needed. FOR PAIN   Yes Historical Provider, MD  Multiple Vitamins-Minerals (ADULT ONE DAILY GUMMIES) CHEW Chew 2 each by mouth every morning.   Yes Historical Provider, MD  QUEtiapine (SEROQUEL) 300 MG tablet Take 600 mg by mouth at bedtime.     Yes Historical Provider, MD  sertraline (ZOLOFT) 100 MG tablet Take 150 mg by mouth daily.    Yes Historical Provider, MD  traZODone (DESYREL) 150 MG tablet Take 150 mg by mouth at bedtime.     Yes Historical Provider, MD  ziprasidone (  GEODON) 80 MG capsule Take 160 mg by mouth at bedtime.     Yes Historical Provider, MD  zolpidem (AMBIEN) 10 MG tablet Take 10 mg by mouth at bedtime.     Yes Historical Provider, MD     Allergies:  No Known Allergies  Social History:   reports that she has never smoked. She does not have any smokeless tobacco history on file. She reports that she does not drink alcohol or use illicit drugs.  Family History: Family History  Problem Relation Age of Onset  . Stroke Father       Died recently, Age 53  . Heart failure Mother   . Diabetes Sister   . Pneumonia Father   . Coronary artery disease Father   . Parkinsonism Mother      Physical Exam: Filed Vitals:   07/03/11 1443 07/03/11 1655 07/03/11 1941 07/03/11 2027  BP: 136/94 145/107 135/94 125/90  Pulse: 113 114 109 111  Temp: 97.7 F (36.5 C) 97.7 F (36.5 C) 98 F (36.7 C) 98.4 F (36.9 C)  TempSrc: Oral Oral Oral   Resp: 20 18 18 18   Height:      Weight:    150.095 kg (330 lb 14.4 oz)  SpO2: 96% 92% 96% 96%   Blood pressure 125/90, pulse 111, temperature 98.4 F (36.9 C), temperature source Oral, resp. rate 18, height 5\' 5"  (1.651 m), weight 150.095 kg (330 lb 14.4 oz), SpO2 96.00%.  GEN: Pleasant morbidly obese Caucasian lady lying in the stretcher in no acute distress; attempts to be cooperative with exam. Becomes extremely short of breath with just moving around on the stretcher. PSYCH:  alert and oriented x4; does not appear anxious or depressed; affect is appropriate. HEENT: Mucous membranes pink and anicteric; PERRLA; EOM intact; thick neck unable to appreciate JVD  Breasts:: Edema of the left breast CHEST WALL: No tenderness CHEST: Normal respiration, clear to auscultation bilaterally HEART: Regular rate and rhythm; no murmurs rubs or gallops BACK:  no CVA tenderness ABDOMEN: Obese, peau d'orange appearance up to the level of the navel soft non-tender; no masses, large organomegaly, normal abdominal bowel sounds; no pannus; no intertriginous candida. Rectal Exam: Not done EXTREMITIES: 3+ weeping edema of both legs;  Genitalia: not examined PULSES: 2+ and symmetric SKIN:  no rash other than ulceration due to edema. CNS: Cranial nerves 2-12 grossly intact no focal lateralizing neurologic deficit   Labs & Imaging Results for orders placed during the hospital encounter of 07/03/11 (from the past 48 hour(s))  CBC     Status: Abnormal   Collection Time   07/03/11  1:46 PM      Component  Value Range Comment   WBC 7.5  4.0 - 10.5 (K/uL)    RBC 4.42  3.87 - 5.11 (MIL/uL)    Hemoglobin 12.2  12.0 - 15.0 (g/dL)    HCT 45.4  09.8 - 11.9 (%)    MCV 86.9  78.0 - 100.0 (fL)    MCH 27.6  26.0 - 34.0 (pg)    MCHC 31.8  30.0 - 36.0 (g/dL)    RDW 14.7 (*) 82.9 - 15.5 (%)    Platelets 251  150 - 400 (K/uL)   DIFFERENTIAL     Status: Normal   Collection Time   07/03/11  1:46 PM      Component Value Range Comment   Neutrophils Relative 66  43 - 77 (%)    Neutro Abs 4.9  1.7 - 7.7 (K/uL)  Lymphocytes Relative 26  12 - 46 (%)    Lymphs Abs 1.9  0.7 - 4.0 (K/uL)    Monocytes Relative 7  3 - 12 (%)    Monocytes Absolute 0.5  0.1 - 1.0 (K/uL)    Eosinophils Relative 1  0 - 5 (%)    Eosinophils Absolute 0.1  0.0 - 0.7 (K/uL)    Basophils Relative 0  0 - 1 (%)    Basophils Absolute 0.0  0.0 - 0.1 (K/uL)   COMPREHENSIVE METABOLIC PANEL     Status: Abnormal   Collection Time   07/03/11  1:46 PM      Component Value Range Comment   Sodium 137  135 - 145 (mEq/L)    Potassium 4.3  3.5 - 5.1 (mEq/L)    Chloride 100  96 - 112 (mEq/L)    CO2 26  19 - 32 (mEq/L)    Glucose, Bld 106 (*) 70 - 99 (mg/dL)    BUN 14  6 - 23 (mg/dL)    Creatinine, Ser 1.61 (*) 0.50 - 1.10 (mg/dL)    Calcium 8.6  8.4 - 10.5 (mg/dL)    Total Protein 6.2  6.0 - 8.3 (g/dL)    Albumin 2.8 (*) 3.5 - 5.2 (g/dL)    AST 23  0 - 37 (U/L)    ALT 12  0 - 35 (U/L)    Alkaline Phosphatase 159 (*) 39 - 117 (U/L)    Total Bilirubin 0.7  0.3 - 1.2 (mg/dL)    GFR calc non Af Amer 52 (*) >90 (mL/min)    GFR calc Af Amer 61 (*) >90 (mL/min)   PRO B NATRIURETIC PEPTIDE     Status: Abnormal   Collection Time   07/03/11  1:46 PM      Component Value Range Comment   Pro B Natriuretic peptide (BNP) 10548.0 (*) 0 - 125 (pg/mL)   TROPONIN I     Status: Normal   Collection Time   07/03/11  1:46 PM      Component Value Range Comment   Troponin I <0.30  <0.30 (ng/mL)   URINALYSIS, ROUTINE W REFLEX MICROSCOPIC     Status: Abnormal    Collection Time   07/03/11  2:52 PM      Component Value Range Comment   Color, Urine YELLOW  YELLOW     APPearance CLEAR  CLEAR     Specific Gravity, Urine >1.030 (*) 1.005 - 1.030     pH 6.0  5.0 - 8.0     Glucose, UA NEGATIVE  NEGATIVE (mg/dL)    Hgb urine dipstick NEGATIVE  NEGATIVE     Bilirubin Urine SMALL (*) NEGATIVE     Ketones, ur NEGATIVE  NEGATIVE (mg/dL)    Protein, ur 096 (*) NEGATIVE (mg/dL)    Urobilinogen, UA 1.0  0.0 - 1.0 (mg/dL)    Nitrite NEGATIVE  NEGATIVE     Leukocytes, UA NEGATIVE  NEGATIVE    URINE MICROSCOPIC-ADD ON     Status: Abnormal   Collection Time   07/03/11  2:52 PM      Component Value Range Comment   Squamous Epithelial / LPF FEW (*) RARE     WBC, UA 0-2  <3 (WBC/hpf)    Bacteria, UA FEW (*) RARE     Casts HYALINE CASTS (*) NEGATIVE    POCT PREGNANCY, URINE     Status: Normal   Collection Time   07/03/11  2:55 PM  Component Value Range Comment   Preg Test, Ur NEGATIVE  NEGATIVE     Dg Chest Port 1 View  07/03/2011  *RADIOLOGY REPORT*  Clinical Data: Cough.  Congestive heart failure.  Short of breath.  PORTABLE CHEST - 1 VIEW  Comparison: 11/23/2010  Findings: Cardiac silhouette remains enlarged.  Film is under penetrated.  Allowing for that, I think the lungs are clear. Cannot completely rule out basilar lung disease, particularly on the left.  IMPRESSION: No active disease seen.  Enlarged cardiac silhouette.  Poor visualization of the left lung base because of technical factors.  Original Report Authenticated By: Thomasenia Sales, M.D.      Assessment Present on Admission:   fluid overload possibly due to acute heart failure, of unclear etiology.  Marland Kitchen Possibly Acute CHF; severe nephrotic syndrome although proteinuria appears to be mild  .ARF (acute renal failure), a doubling of her creatinine from 0.65-1 point   .Morbid obesity .Bipolar 1 disorder  .HTN (hypertension)   PLAN: We'll admit this lady for evaluation, pending cardiac enzymes  x1 to see if there's any evidence of cardiac damage; a 2-D echo; will also start a 24-hour urine collection for protein and creatinine anticipating a normal 2-D echo, and to accelerate the discharge process.  Will consult cardiology admission.  Start treatment of her hypertension although she has no history of this. We'll withhold ACE inhibitors for the time being, since her creatinine is elevated and we will be giving Lasix.   Other plans as per orders.   Jadia Capers 07/03/2011, 8:58 PM

## 2011-07-03 NOTE — ED Notes (Signed)
Patient sitting up in bed; denies pain or shortness of breath.  Patient has bilateral lower extremity edema with redness below the knees.  Respirations even and unlabored; able to speak in complete sentences without difficulty.  Family member at bedside.

## 2011-07-03 NOTE — ED Notes (Signed)
Sob, for 2 weeks, swelling of legs.  Redness esp of lt lower leg with leakage of flds.  Sent from [facility] for eval.

## 2011-07-04 ENCOUNTER — Other Ambulatory Visit: Payer: Self-pay

## 2011-07-04 DIAGNOSIS — I059 Rheumatic mitral valve disease, unspecified: Secondary | ICD-10-CM

## 2011-07-04 LAB — COMPREHENSIVE METABOLIC PANEL
BUN: 15 mg/dL (ref 6–23)
CO2: 30 mEq/L (ref 19–32)
Calcium: 8.3 mg/dL — ABNORMAL LOW (ref 8.4–10.5)
Creatinine, Ser: 1.22 mg/dL — ABNORMAL HIGH (ref 0.50–1.10)
GFR calc Af Amer: 64 mL/min — ABNORMAL LOW (ref >90)
GFR calc non Af Amer: 55 mL/min — ABNORMAL LOW (ref >90)
Glucose, Bld: 98 mg/dL (ref 70–99)

## 2011-07-04 LAB — LIPID PANEL
Cholesterol: 76 mg/dL (ref 0–200)
HDL: 25 mg/dL — ABNORMAL LOW (ref >39)
LDL Cholesterol: 33 mg/dL (ref 0–99)
Triglycerides: 90 mg/dL (ref <150)

## 2011-07-04 LAB — TSH: TSH: 2.751 u[IU]/mL (ref 0.350–4.500)

## 2011-07-04 LAB — CBC
Hemoglobin: 11.7 g/dL — ABNORMAL LOW (ref 12.0–15.0)
Platelets: 243 10*3/uL (ref 150–400)
RBC: 4.32 MIL/uL (ref 3.87–5.11)
WBC: 6.5 10*3/uL (ref 4.0–10.5)

## 2011-07-04 LAB — MRSA PCR SCREENING: MRSA by PCR: NEGATIVE

## 2011-07-04 LAB — T4, FREE: Free T4: 0.91 ng/dL (ref 0.80–1.80)

## 2011-07-04 MED ORDER — SODIUM CHLORIDE 0.9 % IJ SOLN
INTRAMUSCULAR | Status: AC
  Filled 2011-07-04: qty 3

## 2011-07-04 MED ORDER — ZIPRASIDONE HCL 80 MG PO CAPS
ORAL_CAPSULE | ORAL | Status: AC
  Filled 2011-07-04: qty 2

## 2011-07-04 NOTE — Progress Notes (Signed)
07/04/11 1215 Administered pneumococcal vaccine in left deltoid per protocol, pt. Tolerated well, no complaints. Nurse to monitor site. Janeece Fitting, NS, ACC/ Lewis Shock, RN

## 2011-07-04 NOTE — Plan of Care (Signed)
Problem: Phase II Progression Outcomes Goal: Walk in hall or up in chair TID Outcome: Completed/Met Date Met:  07/04/11 07/04/11 1720 Patient up to chair this morning and evening, some dyspnea with activity, denies pain. Nursing to progress activity as tolerated.

## 2011-07-04 NOTE — Progress Notes (Signed)
*  PRELIMINARY RESULTS* Echocardiogram 2D Echocardiogram has been performed.  Conrad Eureka 07/04/2011, 2:26 PM

## 2011-07-04 NOTE — Progress Notes (Signed)
Subjective: Breathing is about the same as yesterday, still feels short of breath, no chest pain  Objective: Vital signs in last 24 hours: Temp:  [97.4 F (36.3 C)-98.4 F (36.9 C)] 97.4 F (36.3 C) (04/04 0546) Pulse Rate:  [86-114] 86  (04/04 0546) Resp:  [18-20] 20  (04/04 0546) BP: (105-145)/(73-107) 105/73 mmHg (04/04 0546) SpO2:  [92 %-99 %] 93 % (04/04 0546) Weight:  [131.543 kg (290 lb)-150.095 kg (330 lb 14.4 oz)] 150.095 kg (330 lb 14.4 oz) (04/03 2027) Weight change:  Last BM Date: 07/03/11  Intake/Output from previous day: 04/03 0701 - 04/04 0700 In: 720 [P.O.:720] Out: 6900 [Urine:6900]     Physical Exam: General: Alert, awake, oriented x3, in no acute distress. HEENT: No bruits, no goiter. Heart: Regular rate and rhythm, without murmurs, rubs, gallops. Lungs: Clear to auscultation bilaterally. Abdomen: Soft, nontender, obese, positive bowel sounds. Extremities:2+ edema b/l Neuro: Grossly intact, nonfocal.    Lab Results: Basic Metabolic Panel:  Basename 07/04/11 0552 07/03/11 1346  NA 140 137  K 3.4* 4.3  CL 100 100  CO2 30 26  GLUCOSE 98 106*  BUN 15 14  CREATININE 1.22* 1.27*  CALCIUM 8.3* 8.6  MG 1.7 --  PHOS -- --   Liver Function Tests:  Basename 07/04/11 0552 07/03/11 1346  AST 20 23  ALT 10 12  ALKPHOS 147* 159*  BILITOT 0.7 0.7  PROT 5.9* 6.2  ALBUMIN 2.6* 2.8*   No results found for this basename: LIPASE:2,AMYLASE:2 in the last 72 hours No results found for this basename: AMMONIA:2 in the last 72 hours CBC:  Basename 07/04/11 0552 07/03/11 1346  WBC 6.5 7.5  NEUTROABS -- 4.9  HGB 11.7* 12.2  HCT 37.4 38.4  MCV 86.6 86.9  PLT 243 251   Cardiac Enzymes:  Basename 07/03/11 2050 07/03/11 1346  CKTOTAL 131 --  CKMB 2.3 --  CKMBINDEX -- --  TROPONINI <0.30 <0.30   BNP:  Basename 07/03/11 1346  PROBNP 10548.0*   D-Dimer: No results found for this basename: DDIMER:2 in the last 72 hours CBG: No results found for  this basename: GLUCAP:6 in the last 72 hours Hemoglobin A1C: No results found for this basename: HGBA1C in the last 72 hours Fasting Lipid Panel:  Basename 07/04/11 0552  CHOL 76  HDL 25*  LDLCALC 33  TRIG 90  CHOLHDL 3.0  LDLDIRECT --   Thyroid Function Tests: No results found for this basename: TSH,T4TOTAL,FREET4,T3FREE,THYROIDAB in the last 72 hours Anemia Panel: No results found for this basename: VITAMINB12,FOLATE,FERRITIN,TIBC,IRON,RETICCTPCT in the last 72 hours Coagulation: No results found for this basename: LABPROT:2,INR:2 in the last 72 hours Urine Drug Screen: Drugs of Abuse  No results found for this basename: labopia, cocainscrnur, labbenz, amphetmu, thcu, labbarb    Alcohol Level: No results found for this basename: ETH:2 in the last 72 hours Urinalysis:  Basename 07/03/11 1452  COLORURINE YELLOW  LABSPEC >1.030*  PHURINE 6.0  GLUCOSEU NEGATIVE  HGBUR NEGATIVE  BILIRUBINUR SMALL*  KETONESUR NEGATIVE  PROTEINUR 100*  UROBILINOGEN 1.0  NITRITE NEGATIVE  LEUKOCYTESUR NEGATIVE    Recent Results (from the past 240 hour(s))  MRSA PCR SCREENING     Status: Normal   Collection Time   07/03/11 11:33 PM      Component Value Range Status Comment   MRSA by PCR NEGATIVE  NEGATIVE  Final     Studies/Results: Dg Chest Port 1 View  07/03/2011  *RADIOLOGY REPORT*  Clinical Data: Cough.  Congestive heart failure.  Short of breath.  PORTABLE CHEST - 1 VIEW  Comparison: 11/23/2010  Findings: Cardiac silhouette remains enlarged.  Film is under penetrated.  Allowing for that, I think the lungs are clear. Cannot completely rule out basilar lung disease, particularly on the left.  IMPRESSION: No active disease seen.  Enlarged cardiac silhouette.  Poor visualization of the left lung base because of technical factors.  Original Report Authenticated By: Thomasenia Sales, M.D.    Medications: Scheduled Meds:   . carvedilol  3.125 mg Oral Q12H  . enoxaparin  40 mg Subcutaneous  Q24H  . furosemide  40 mg Intravenous Once  . furosemide  40 mg Intravenous Q12H  . isosorbide dinitrate  10 mg Oral TID   And  . hydrALAZINE  25 mg Oral Q8H  . pneumococcal 23 valent vaccine  0.5 mL Intramuscular Tomorrow-1000  . potassium chloride  40 mEq Oral BID  . QUEtiapine  600 mg Oral QHS  . sertraline  150 mg Oral Daily  . sodium chloride  3 mL Intravenous Q12H  . traZODone  150 mg Oral QHS  . ziprasidone  160 mg Oral QHS  . zolpidem  10 mg Oral QHS   Continuous Infusions:  PRN Meds:.acetaminophen, cyclobenzaprine, ondansetron (ZOFRAN) IV, oxyCODONE, sodium chloride, technetium albumin aggregated, xenon xe 133  Assessment/Plan:  Active Problems:  Morbid obesity  Bipolar 1 disorder  ARF (acute renal failure)  Acute CHF  HTN (hypertension)  Plan:  1. Acute CHF.  Continue with IV lasix and monitor Is and Os.  2D echo is pending.  Patient is having good urine output. V/Q scan low probability for PE. Keep legs elevated  2. HTN, controlled  3. Bipolar, stable  4. Morbid obesity.   LOS: 1 day   Jaevon Paras [facility] Pager: [phone] 07/04/2011, 10:31 AM

## 2011-07-04 NOTE — Progress Notes (Signed)
CARE MANAGEMENT NOTE 07/04/2011  Patient:  Karla Anderson, Karla Anderson   Account Number:  [id number]  Date Initiated:  07/04/2011  Documentation initiated by:  Rosemary Holms  Subjective/Objective Assessment:   pt admitted with SOB, heart failure. PTA lived as home with family (Mother, Daughter and Boyfriend also assists)     Action/Plan:   Spoke to pt at bedside. No HH DME needs identified at this time.   Anticipated DC Date:  07/06/2011   Anticipated DC Plan:  HOME/SELF CARE      DC Planning Services  CM consult      Choice offered to / List presented to:             Status of service:  Completed, signed off Medicare Important Message given?   (If response is "NO", the following Medicare IM given date fields will be blank) Date Medicare IM given:   Date Additional Medicare IM given:    Discharge Disposition:    Per UR Regulation:    If discussed at Long Length of Stay Meetings, dates discussed:    Comments:  07/04/11 1400 Yashica Sterbenz Leanord Hawking RN BSN CM

## 2011-07-05 ENCOUNTER — Encounter (HOSPITAL_COMMUNITY): Payer: Self-pay | Admitting: Cardiology

## 2011-07-05 DIAGNOSIS — R9431 Abnormal electrocardiogram [ECG] [EKG]: Secondary | ICD-10-CM

## 2011-07-05 DIAGNOSIS — F319 Bipolar disorder, unspecified: Secondary | ICD-10-CM

## 2011-07-05 DIAGNOSIS — I5021 Acute systolic (congestive) heart failure: Principal | ICD-10-CM

## 2011-07-05 DIAGNOSIS — R609 Edema, unspecified: Secondary | ICD-10-CM

## 2011-07-05 LAB — URINE CULTURE

## 2011-07-05 LAB — PROTEIN / CREATININE RATIO, URINE
Creatinine, Urine: 19.51 mg/dL
Protein Creatinine Ratio: 1.13 — ABNORMAL HIGH (ref 0.00–0.15)

## 2011-07-05 LAB — BASIC METABOLIC PANEL
Chloride: 98 mEq/L (ref 96–112)
GFR calc Af Amer: 61 mL/min — ABNORMAL LOW (ref >90)
GFR calc non Af Amer: 53 mL/min — ABNORMAL LOW (ref >90)
Potassium: 3.5 mEq/L (ref 3.5–5.1)

## 2011-07-05 LAB — CREATININE, URINE, 24 HOUR: Creatinine, Urine: 19.77 mg/dL

## 2011-07-05 LAB — PROTEIN, URINE, 24 HOUR
Protein, 24H Urine: 735 mg/d — ABNORMAL HIGH (ref 50–100)
Protein, Urine: 21 mg/dL

## 2011-07-05 MED ORDER — SODIUM CHLORIDE 0.9 % IJ SOLN
INTRAMUSCULAR | Status: AC
  Administered 2011-07-05: 3 mL
  Filled 2011-07-05: qty 3

## 2011-07-05 MED ORDER — LISINOPRIL 5 MG PO TABS
5.0000 mg | ORAL_TABLET | Freq: Every day | ORAL | Status: DC
  Administered 2011-07-05 – 2011-07-09 (×4): 5 mg via ORAL
  Filled 2011-07-05 (×4): qty 1

## 2011-07-05 MED ORDER — ENOXAPARIN SODIUM 80 MG/0.8ML ~~LOC~~ SOLN
0.5000 mg/kg | Freq: Every day | SUBCUTANEOUS | Status: DC
  Administered 2011-07-05 – 2011-07-08 (×4): 75 mg via SUBCUTANEOUS
  Filled 2011-07-05 (×4): qty 0.8

## 2011-07-05 NOTE — Progress Notes (Signed)
Subjective: Breathing is mildly improved, general edema is also mildly improved  Objective: Vital signs in last 24 hours: Temp:  [97.1 F (36.2 C)-98 F (36.7 C)] 97.2 F (36.2 C) (04/05 1026) Pulse Rate:  [88-98] 91  (04/05 1026) Resp:  [18-20] 20  (04/05 1026) BP: (104-128)/(74-89) 112/83 mmHg (04/05 1026) SpO2:  [93 %-95 %] 94 % (04/05 1026) Weight:  [145.242 kg (320 lb 3.2 oz)-146.6 kg (323 lb 3.1 oz)] 145.242 kg (320 lb 3.2 oz) (04/05 0630) Weight change: 15.057 kg (33 lb 3.1 oz) Last BM Date: 07/04/11  Intake/Output from previous day: 04/04 0701 - 04/05 0700 In: 723 [P.O.:720; I.V.:3] Out: 4350 [Urine:4350] Total I/O In: 6 [I.V.:6] Out: -    Physical Exam: General: Alert, awake, oriented x3, in no acute distress. HEENT: No bruits, no goiter. Heart: Regular rate and rhythm, without murmurs, rubs, gallops. Lungs: Clear to auscultation bilaterally. Abdomen: Soft, nontender, nondistended, positive bowel sounds. Extremities:2-3+ edema b/l Neuro: Grossly intact, nonfocal.    Lab Results: Basic Metabolic Panel:  Basename 07/05/11 0455 07/04/11 0552  NA 138 140  K 3.5 3.4*  CL 98 100  CO2 34* 30  GLUCOSE 99 98  BUN 15 15  CREATININE 1.26* 1.22*  CALCIUM 8.3* 8.3*  MG -- 1.7  PHOS -- --   Liver Function Tests:  Basename 07/04/11 0552 07/03/11 1346  AST 20 23  ALT 10 12  ALKPHOS 147* 159*  BILITOT 0.7 0.7  PROT 5.9* 6.2  ALBUMIN 2.6* 2.8*   No results found for this basename: LIPASE:2,AMYLASE:2 in the last 72 hours No results found for this basename: AMMONIA:2 in the last 72 hours CBC:  Basename 07/04/11 0552 07/03/11 1346  WBC 6.5 7.5  NEUTROABS -- 4.9  HGB 11.7* 12.2  HCT 37.4 38.4  MCV 86.6 86.9  PLT 243 251   Cardiac Enzymes:  Basename 07/03/11 2050 07/03/11 1346  CKTOTAL 131 --  CKMB 2.3 --  CKMBINDEX -- --  TROPONINI <0.30 <0.30   BNP:  Basename 07/03/11 1346  PROBNP 10548.0*   D-Dimer: No results found for this basename:  DDIMER:2 in the last 72 hours CBG: No results found for this basename: GLUCAP:6 in the last 72 hours Hemoglobin A1C: No results found for this basename: HGBA1C in the last 72 hours Fasting Lipid Panel:  Basename 07/04/11 0552  CHOL 76  HDL 25*  LDLCALC 33  TRIG 90  CHOLHDL 3.0  LDLDIRECT --   Thyroid Function Tests:  Basename 07/03/11 2050  TSH 2.751  T4TOTAL --  FREET4 0.91  T3FREE --  THYROIDAB --   Anemia Panel: No results found for this basename: VITAMINB12,FOLATE,FERRITIN,TIBC,IRON,RETICCTPCT in the last 72 hours Coagulation: No results found for this basename: LABPROT:2,INR:2 in the last 72 hours Urine Drug Screen: Drugs of Abuse  No results found for this basename: labopia, cocainscrnur, labbenz, amphetmu, thcu, labbarb    Alcohol Level: No results found for this basename: ETH:2 in the last 72 hours Urinalysis:  Basename 07/03/11 1452  COLORURINE YELLOW  LABSPEC >1.030*  PHURINE 6.0  GLUCOSEU NEGATIVE  HGBUR NEGATIVE  BILIRUBINUR SMALL*  KETONESUR NEGATIVE  PROTEINUR 100*  UROBILINOGEN 1.0  NITRITE NEGATIVE  LEUKOCYTESUR NEGATIVE    Recent Results (from the past 240 hour(s))  URINE CULTURE     Status: Normal   Collection Time   07/03/11  2:52 PM      Component Value Range Status Comment   Specimen Description URINE, CLEAN CATCH   Final    Special Requests NONE  Final    Culture  Setup Time [id number]   Final    Colony Count 70,000 COLONIES/ML   Final    Culture     Final    Value: Multiple bacterial morphotypes present, none predominant. Suggest appropriate recollection if clinically indicated.   Report Status 07/05/2011 FINAL   Final   MRSA PCR SCREENING     Status: Normal   Collection Time   07/03/11 11:33 PM      Component Value Range Status Comment   MRSA by PCR NEGATIVE  NEGATIVE  Final     Studies/Results: Nm Pulmonary Per & Vent  07/03/2011  *RADIOLOGY REPORT*  Clinical Data:  41 year old female with shortness of breath.  NUCLEAR  MEDICINE VENTILATION - PERFUSION LUNG SCAN  Technique:  Wash-in, equilibrium, and wash-out phase ventilation images were obtained using Xe-133 gas.  Perfusion images were obtained in multiple projections after intravenous injection of Tc- 72m MAA.  Radiopharmaceuticals:  8 mCi Xe-133 gas and 3.3 mCi Tc-76m MAA.  Comparison:  Portable chest radiograph from 1450 hours the same day.  Findings: Comparison chest radiograph demonstrates cardiomegaly and basilar hypoventilation.  Ventilation imaging demonstrates prominent photopenic defect from cardiomegaly.  No other ventilation defect.  Widespread gas trapping.  Perfusion imaging demonstrates photopenic defect from cardiomegaly. No other perfusion defect.  IMPRESSION: Very low probability of acute pulmonary embolus.  Original Report Authenticated By: Harley Hallmark, M.D.   Dg Chest Port 1 View  07/03/2011  *RADIOLOGY REPORT*  Clinical Data: Cough.  Congestive heart failure.  Short of breath.  PORTABLE CHEST - 1 VIEW  Comparison: 11/23/2010  Findings: Cardiac silhouette remains enlarged.  Film is under penetrated.  Allowing for that, I think the lungs are clear. Cannot completely rule out basilar lung disease, particularly on the left.  IMPRESSION: No active disease seen.  Enlarged cardiac silhouette.  Poor visualization of the left lung base because of technical factors.  Original Report Authenticated By: Thomasenia Sales, M.D.    Medications: Scheduled Meds:   . carvedilol  3.125 mg Oral Q12H  . enoxaparin  0.5 mg/kg Subcutaneous QHS  . furosemide  40 mg Intravenous Q12H  . isosorbide dinitrate  10 mg Oral TID   And  . hydrALAZINE  25 mg Oral Q8H  . potassium chloride  40 mEq Oral BID  . QUEtiapine  600 mg Oral QHS  . sertraline  150 mg Oral Daily  . sodium chloride  3 mL Intravenous Q12H  . sodium chloride      . sodium chloride      . traZODone  150 mg Oral QHS  . ziprasidone  160 mg Oral QHS  . zolpidem  10 mg Oral QHS  . DISCONTD: enoxaparin  40  mg Subcutaneous Q24H   Continuous Infusions:  PRN Meds:.acetaminophen, cyclobenzaprine, ondansetron (ZOFRAN) IV, oxyCODONE, sodium chloride  Assessment/Plan:  Active Problems:  Morbid obesity  Bipolar 1 disorder  ARF (acute renal failure)  Acute systolic heart failure  HTN (hypertension)  Secondary cardiomyopathy, unspecified  1. Acute systolic CHF.  Patient is continued on IV lasix with good urine output. Echo shows EF of 10-15%.  Cardiology is following patient.  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  We will discontinue hydralazine and nitrates in favor of ace inhibitor. Continue diuresis for now.  She is also on a beta blocker.  2. HTN , stable  3. Bipolar, stable  4. Morbid obesity.   LOS: 2 days   Sahas Sluka [facility] Pager: [phone]  07/05/2011, 12:25 PM

## 2011-07-05 NOTE — Consult Note (Signed)
Clinical Summary Karla Anderson is a 41 y.o.female presently admitted to the hospital with a two-week history of progressive swelling and shortness of breath as well as orthopnea. She denies any recent chest pain symptoms, has a more chronic history of intermittent palpitations that have not worsened recently. Cardiac history is limited. It appears that she was seen by our practice approximately 10 years ago with a rate related left bundle branch block. Details of her cardiac structure and function from that time or not available based on record review. She has now been diagnosed with acute systolic heart failure, echocardiogram demonstrating diffuse left ventricular hypokinesis with LVEF of 10-15%, mild mitral regurgitation, and moderate pulmonary hypertension with PASP of 15 mm mercury.  She reports no recent illnesses, no change in her baseline diet.  Troponin levels are normal, pro-BNP was 10,548. Radiologic studies include a ventilation perfusion lung scan which was very low probability for pulmonary embolus, also chest x-ray showing a large cardiac silhouette with no pulmonary edema, somewhat limited examination. She is mildly anemic, normal MCV, normal TSH, AST and ALT are also normal. Urine pregnancy test was negative. We are consulted to assist with her management.  No Known Allergies  Medications    . carvedilol  3.125 mg Oral Q12H  . enoxaparin  0.5 mg/kg Subcutaneous QHS  . furosemide  40 mg Intravenous Q12H  . isosorbide dinitrate  10 mg Oral TID   And  . hydrALAZINE  25 mg Oral Q8H  . pneumococcal 23 valent vaccine  0.5 mL Intramuscular Tomorrow-1000  . potassium chloride  40 mEq Oral BID  . QUEtiapine  600 mg Oral QHS  . sertraline  150 mg Oral Daily  . sodium chloride  3 mL Intravenous Q12H  . sodium chloride      . sodium chloride      . traZODone  150 mg Oral QHS  . ziprasidone  160 mg Oral QHS  . zolpidem  10 mg Oral QHS  . DISCONTD: enoxaparin  40 mg Subcutaneous  Q24H    Past Medical History  Diagnosis Date  . Tachycardia     Rate related LBBB - seen by Dr. Graciela Husbands and Dr. Dietrich Pates in the past  . Anxiety   . Depression   . Heart murmur     No reported valvular disease  . Back pain   . Bipolar 1 disorder   . Arthritis     Bilateral knee    Past Surgical History  Procedure Date  . Cesarean section   . Carpel tunn Carpel tunnel sx  . Tubal ligation   . Tubal ligation reversal   . Cholecystectomy 11/09/2010    Procedure: LAPAROSCOPIC CHOLECYSTECTOMY;  Surgeon: Fabio Bering;  Location: [facility];  Service: General;  Laterality: N/A;  . Esophagogastroduodenoscopy 11/19/2010    Procedure: ESOPHAGOGASTRODUODENOSCOPY (EGD);  Surgeon: Malissa Hippo, MD;  Location: [facility];  Service: Endoscopy;  Laterality: N/A;  . Laparotomy 11/19/2010    Procedure: EXPLORATORY LAPAROTOMY;  Surgeon: Fabio Bering;  Location: [facility];  Service: General;  Laterality: N/A;  . Gastrectomy 11/19/2010    Procedure: GASTRECTOMY;  Surgeon: Fabio Bering;  Location: [facility];  Service: General;  Laterality: N/A;  Partial Gastrectomy  . Gastrojejunostomy 11/19/2010    Procedure: GASTROJEJUNOSTOMY;  Surgeon: Fabio Bering;  Location: [facility];  Service: General;  Laterality: N/A;  Roux-En-Y    Family History  Problem Relation Age of Onset  . Stroke Father  Died recently, Age 30  . Heart failure Mother   . Diabetes Sister   . Pneumonia Father   . Coronary artery disease Father   . Parkinsonism Mother     Social History Karla Anderson reports that she has never smoked. She does not have any smokeless tobacco history on file. Karla Anderson reports that she does not drink alcohol.  Review of Systems Negative except as outlined above.  Physical Examination Temp:  [97.1 F (36.2 C)-98 F (36.7 C)] 97.2 F (36.2 C) (04/05 1026) Pulse Rate:  [88-98] 91  (04/05 1026) Resp:  [18-20] 20  (04/05 1026) BP: (104-128)/(74-89) 112/83 mmHg (04/05 1026) SpO2:  [93  %-95 %] 94 % (04/05 1026) Weight:  [320 lb 3.2 oz (145.242 kg)-323 lb 3.1 oz (146.6 kg)] 320 lb 3.2 oz (145.242 kg) (04/05 0630)  I/O last 3 completed shifts: In: 1443 [P.O.:1440; I.V.:3] Out: 11914 [Urine:11250]  Morbidly obese, chronically ill-appearing woman in no acute distress. HEENT: Conjunctiva and lids normal, oropharynx with poor dentition. Neck: Supple, increased girth, difficult to assess JVP, no carotid bruits, no thyromegaly. Lungs: Clear to auscultation, decreased BS, nonlabored breathing at rest. Cardiac: Regular rate and rhythm, distant, no S3, no significant systolic murmur, no pericardial rub. Abdomen: Soft, nontender, protuberant, bowel sounds present, no guarding or rebound. Extremities: Chronic appearing, 3+ edema, distal pulses 1-2+. Skin: Warm and dry. Musculoskeletal: No kyphosis. Neuropsychiatric: Alert and oriented x3, affect grossly appropriate.   Testing Lab Results  Component Value Date   WBC 6.5 07/04/2011   HGB 11.7* 07/04/2011   HCT 37.4 07/04/2011   MCV 86.6 07/04/2011   PLT 243 07/04/2011    Lab Results  Component Value Date   CREATININE 1.26* 07/05/2011   BUN 15 07/05/2011   NA 138 07/05/2011   K 3.5 07/05/2011   CL 98 07/05/2011   CO2 34* 07/05/2011    Lab Results  Component Value Date   ALT 10 07/04/2011   AST 20 07/04/2011   ALKPHOS 147* 07/04/2011   BILITOT 0.7 07/04/2011    Lab Results  Component Value Date   TSH 2.751 07/03/2011    Lab Results  Component Value Date   LDLCALC 33 07/04/2011    Lab Results  Component Value Date   CKTOTAL 131 07/03/2011   CKMB 2.3 07/03/2011   TROPONINI <0.30 07/03/2011   ECG Recent tracings reviewed showing sinus rhythm with IVCD consistent with left bundle branch block type. This is noted at heart rates above and below 100 beats per minute.   Impression  1. Acute systolic heart failure, symptoms worsening over the last 2 weeks, etiology not entirely clear but suspect nonischemic based on diffuse hypokinesis by  echocardiogram with LVEF of 10-15%. She had no focal wall motion abnormalities. Does have a history of reported rate related left bundle branch block. TSH is normal as is AST and ALT. She has no definite family history of cardiomyopathy or sudden cardiac death. She denies any illicit substance abuse or toxin exposures. She is diuresing well but still remains volume overloaded. Otherwise hemodynamically stable.  2. Morbid obesity at baseline.  3. No long-standing history of hypertension or diabetes. Recent lipid profile reviewed with low LDL overall.  4. Reported history of heart murmur without major valvular abnormality on echocardiogram.  5. History of GI bleed with exploratory laparotomy, partial gastrectomy, and Roux-en-Y gastrojejunostomy in August of last year.  Recommendations  Discussed with patient, reviewed medications. At this point would plan to continue intravenous diuresis for additional  volume management. She has been placed on low-dose Coreg, would consider switching from hydralazine and nitrate to low-dose ACE inhibitor with subsequent titration as long as renal function remains stable. We can eventually plan on Aldactone as well. She will require followup and further testing, guided by her clinical stability and response to medical therapy. We will follow with you.   Jonelle Sidle, M.D., F.A.C.C.

## 2011-07-05 NOTE — Progress Notes (Addendum)
1945: when making rounds with day RN, pt stated that she felt like her foley was leaking and that it was burning and uncomfortable. Upon assessment, there was a very small amount of urine that had leaked around foley. This was the first time the pt complained of any leaking, so I would say that the foley was in for about 1hr total from the first complaint, and assessment to the foley being changed. I notified Dr. Orvan Falconer of the patients complaint, he said that it was ok to d/c that foley and place a new catheter. At 2020, I attempted placing foley, and was unsuccessful. Notified pt to call if she needed to urinate until another nurse or NT was able to place a new foley. Pt attempted to use the bedpan, but did not urinate.  At about 0020: pt called out and said that she had had an accident and wet the bed. New foley was placed. Dr. Orvan Falconer notified, and he placed orders for a different urine test, but said to continue the 24hr urine test. Sheryn Bison

## 2011-07-05 NOTE — Progress Notes (Signed)
UR Chart Review Completed  

## 2011-07-05 NOTE — Plan of Care (Signed)
Problem: Phase I Progression Outcomes Goal: Voiding-avoid urinary catheter unless indicated Outcome: Not Progressing Foley catheter in place     

## 2011-07-06 LAB — BASIC METABOLIC PANEL
Calcium: 8.3 mg/dL — ABNORMAL LOW (ref 8.4–10.5)
Creatinine, Ser: 1.4 mg/dL — ABNORMAL HIGH (ref 0.50–1.10)
GFR calc non Af Amer: 47 mL/min — ABNORMAL LOW (ref >90)
Glucose, Bld: 84 mg/dL (ref 70–99)
Sodium: 140 mEq/L (ref 135–145)

## 2011-07-06 LAB — RAPID URINE DRUG SCREEN, HOSP PERFORMED: Amphetamines: NOT DETECTED

## 2011-07-06 MED ORDER — SODIUM CHLORIDE 0.9 % IJ SOLN
INTRAMUSCULAR | Status: AC
  Administered 2011-07-06: 10 mL
  Filled 2011-07-06: qty 3

## 2011-07-06 MED ORDER — FUROSEMIDE 40 MG PO TABS
40.0000 mg | ORAL_TABLET | Freq: Two times a day (BID) | ORAL | Status: DC
  Administered 2011-07-06 – 2011-07-09 (×6): 40 mg via ORAL
  Filled 2011-07-06 (×6): qty 1

## 2011-07-06 NOTE — Progress Notes (Signed)
Subjective: Feels about the same, breathing is stable, no chest pain, tolerating diet  Objective: Vital signs in last 24 hours: Temp:  [97.2 F (36.2 C)-98.1 F (36.7 C)] 98.1 F (36.7 C) (04/06 1326) Pulse Rate:  [77-92] 90  (04/06 1326) Resp:  [18-20] 18  (04/06 1326) BP: (101-114)/(67-80) 108/77 mmHg (04/06 1326) SpO2:  [92 %-96 %] 94 % (04/06 1326) Weight:  [143.518 kg (316 lb 6.4 oz)] 143.518 kg (316 lb 6.4 oz) (04/06 0617) Weight change: -3.082 kg (-6 lb 12.7 oz) Last BM Date: 07/04/11  Intake/Output from previous day: 04/05 0701 - 04/06 0700 In: 1286 [P.O.:1280; I.V.:6] Out: 1925 [Urine:1925] Total I/O In: 493 [P.O.:480; I.V.:13] Out: 850 [Urine:850]   Physical Exam: General: Alert, awake, oriented x3, in no acute distress. HEENT: No bruits, no goiter. Heart: Regular rate and rhythm, without murmurs, rubs, gallops. Lungs: Clear to auscultation bilaterally. Abdomen: Soft, nontender, nondistended, positive bowel sounds. Extremities: 2-3+ edema b/l Neuro: Grossly intact, nonfocal.    Lab Results: Basic Metabolic Panel:  Basename 07/06/11 0451 07/05/11 0455 07/04/11 0552  NA 140 138 --  K 4.0 3.5 --  CL 99 98 --  CO2 37* 34* --  GLUCOSE 84 99 --  BUN 15 15 --  CREATININE 1.40* 1.26* --  CALCIUM 8.3* 8.3* --  MG -- -- 1.7  PHOS -- -- --   Liver Function Tests:  Basename 07/04/11 0552  AST 20  ALT 10  ALKPHOS 147*  BILITOT 0.7  PROT 5.9*  ALBUMIN 2.6*   No results found for this basename: LIPASE:2,AMYLASE:2 in the last 72 hours No results found for this basename: AMMONIA:2 in the last 72 hours CBC:  Basename 07/04/11 0552  WBC 6.5  NEUTROABS --  HGB 11.7*  HCT 37.4  MCV 86.6  PLT 243   Cardiac Enzymes:  Basename 07/03/11 2050  CKTOTAL 131  CKMB 2.3  CKMBINDEX --  TROPONINI <0.30   BNP: No results found for this basename: PROBNP:3 in the last 72 hours D-Dimer: No results found for this basename: DDIMER:2 in the last 72  hours CBG: No results found for this basename: GLUCAP:6 in the last 72 hours Hemoglobin A1C: No results found for this basename: HGBA1C in the last 72 hours Fasting Lipid Panel:  Basename 07/04/11 0552  CHOL 76  HDL 25*  LDLCALC 33  TRIG 90  CHOLHDL 3.0  LDLDIRECT --   Thyroid Function Tests:  Basename 07/03/11 2050  TSH 2.751  T4TOTAL --  FREET4 0.91  T3FREE --  THYROIDAB --   Anemia Panel: No results found for this basename: VITAMINB12,FOLATE,FERRITIN,TIBC,IRON,RETICCTPCT in the last 72 hours Coagulation: No results found for this basename: LABPROT:2,INR:2 in the last 72 hours Urine Drug Screen: Drugs of Abuse  No results found for this basename: labopia, cocainscrnur, labbenz, amphetmu, thcu, labbarb    Alcohol Level: No results found for this basename: ETH:2 in the last 72 hours Urinalysis:  Basename 07/03/11 1452  COLORURINE YELLOW  LABSPEC >1.030*  PHURINE 6.0  GLUCOSEU NEGATIVE  HGBUR NEGATIVE  BILIRUBINUR SMALL*  KETONESUR NEGATIVE  PROTEINUR 100*  UROBILINOGEN 1.0  NITRITE NEGATIVE  LEUKOCYTESUR NEGATIVE    Recent Results (from the past 240 hour(s))  URINE CULTURE     Status: Normal   Collection Time   07/03/11  2:52 PM      Component Value Range Status Comment   Specimen Description URINE, CLEAN CATCH   Final    Special Requests NONE   Final    Culture  Setup Time [id number]   Final    Colony Count 70,000 COLONIES/ML   Final    Culture     Final    Value: Multiple bacterial morphotypes present, none predominant. Suggest appropriate recollection if clinically indicated.   Report Status 07/05/2011 FINAL   Final   MRSA PCR SCREENING     Status: Normal   Collection Time   07/03/11 11:33 PM      Component Value Range Status Comment   MRSA by PCR NEGATIVE  NEGATIVE  Final     Studies/Results: No results found.  Medications: Scheduled Meds:   . carvedilol  3.125 mg Oral Q12H  . enoxaparin  0.5 mg/kg Subcutaneous QHS  . furosemide  40 mg  Intravenous Q12H  . lisinopril  5 mg Oral Daily  . potassium chloride  40 mEq Oral BID  . QUEtiapine  600 mg Oral QHS  . sertraline  150 mg Oral Daily  . sodium chloride  3 mL Intravenous Q12H  . sodium chloride      . traZODone  150 mg Oral QHS  . ziprasidone  160 mg Oral QHS  . zolpidem  10 mg Oral QHS   Continuous Infusions:  PRN Meds:.acetaminophen, cyclobenzaprine, ondansetron (ZOFRAN) IV, oxyCODONE, sodium chloride  Assessment/Plan:  Active Problems:  Morbid obesity  Bipolar 1 disorder  ARF (acute renal failure)  Acute systolic heart failure  HTN (hypertension)  Secondary cardiomyopathy, unspecified  Plan:  Patient requires continued diuresis and is still volume overloaded.  Unfortunately her creatinine is starting to rise with IV diuresis.  Will change lasix to po and continue to follow volume status and diuresis.  If creatinine climbs higher, then will likely need to stop ACE.  Cardiology following.  Blood pressure is stable.   LOS: 3 days   Rosela Supak [facility] Pager: [phone] 07/06/2011, 1:47 PM

## 2011-07-07 LAB — BASIC METABOLIC PANEL
GFR calc Af Amer: 55 mL/min — ABNORMAL LOW (ref >90)
GFR calc non Af Amer: 47 mL/min — ABNORMAL LOW (ref >90)
Glucose, Bld: 82 mg/dL (ref 70–99)
Potassium: 4 mEq/L (ref 3.5–5.1)
Sodium: 140 mEq/L (ref 135–145)

## 2011-07-07 NOTE — Progress Notes (Addendum)
Subjective: No new complaints, no chest pain or shortness of breath, has difficulty walking due to pain in legs  Objective: Vital signs in last 24 hours: Temp:  [97.5 F (36.4 C)-98.7 F (37.1 C)] 97.7 F (36.5 C) (04/07 0948) Pulse Rate:  [81-92] 92  (04/07 0954) Resp:  [16-19] 18  (04/07 0948) BP: (91-123)/(54-77) 91/54 mmHg (04/07 0948) SpO2:  [91 %-96 %] 96 % (04/07 0954) Weight:  [141.522 kg (312 lb)] 141.522 kg (312 lb) (04/07 0702) Weight change:  Last BM Date: 07/04/11  Intake/Output from previous day: 04/06 0701 - 04/07 0700 In: 713 [P.O.:700; I.V.:13] Out: 1850 [Urine:1850] Total I/O In: 240 [P.O.:240] Out: 1700 [Urine:1700]   Physical Exam: General: Alert, awake, oriented x3, in no acute distress. HEENT: No bruits, no goiter. Heart: Regular rate and rhythm, without murmurs, rubs, gallops. Lungs: Clear to auscultation bilaterally. Abdomen: Soft, nontender, nondistended, positive bowel sounds. Extremities:2-3+ edema b/l Neuro: Grossly intact, nonfocal.    Lab Results: Basic Metabolic Panel:  Basename 07/07/11 0526 07/06/11 0451  NA 140 140  K 4.0 4.0  CL 97 99  CO2 39* 37*  GLUCOSE 82 84  BUN 16 15  CREATININE 1.38* 1.40*  CALCIUM 8.5 8.3*  MG -- --  PHOS -- --   Liver Function Tests: No results found for this basename: AST:2,ALT:2,ALKPHOS:2,BILITOT:2,PROT:2,ALBUMIN:2 in the last 72 hours No results found for this basename: LIPASE:2,AMYLASE:2 in the last 72 hours No results found for this basename: AMMONIA:2 in the last 72 hours CBC: No results found for this basename: WBC:2,NEUTROABS:2,HGB:2,HCT:2,MCV:2,PLT:2 in the last 72 hours Cardiac Enzymes: No results found for this basename: CKTOTAL:3,CKMB:3,CKMBINDEX:3,TROPONINI:3 in the last 72 hours BNP: No results found for this basename: PROBNP:3 in the last 72 hours D-Dimer: No results found for this basename: DDIMER:2 in the last 72 hours CBG: No results found for this basename: GLUCAP:6 in the  last 72 hours Hemoglobin A1C: No results found for this basename: HGBA1C in the last 72 hours Fasting Lipid Panel: No results found for this basename: CHOL,HDL,LDLCALC,TRIG,CHOLHDL,LDLDIRECT in the last 72 hours Thyroid Function Tests: No results found for this basename: TSH,T4TOTAL,FREET4,T3FREE,THYROIDAB in the last 72 hours Anemia Panel: No results found for this basename: VITAMINB12,FOLATE,FERRITIN,TIBC,IRON,RETICCTPCT in the last 72 hours Coagulation: No results found for this basename: LABPROT:2,INR:2 in the last 72 hours Urine Drug Screen: Drugs of Abuse     Component Value Date/Time   LABOPIA NONE DETECTED 07/06/2011 2125   COCAINSCRNUR NONE DETECTED 07/06/2011 2125   LABBENZ NONE DETECTED 07/06/2011 2125   AMPHETMU NONE DETECTED 07/06/2011 2125   THCU NONE DETECTED 07/06/2011 2125   LABBARB NONE DETECTED 07/06/2011 2125    Alcohol Level: No results found for this basename: ETH:2 in the last 72 hours Urinalysis: No results found for this basename: COLORURINE:2,APPERANCEUR:2,LABSPEC:2,PHURINE:2,GLUCOSEU:2,HGBUR:2,BILIRUBINUR:2,KETONESUR:2,PROTEINUR:2,UROBILINOGEN:2,NITRITE:2,LEUKOCYTESUR:2 in the last 72 hours  Recent Results (from the past 240 hour(s))  URINE CULTURE     Status: Normal   Collection Time   07/03/11  2:52 PM      Component Value Range Status Comment   Specimen Description URINE, CLEAN CATCH   Final    Special Requests NONE   Final    Culture  Setup Time [id number]   Final    Colony Count 70,000 COLONIES/ML   Final    Culture     Final    Value: Multiple bacterial morphotypes present, none predominant. Suggest appropriate recollection if clinically indicated.   Report Status 07/05/2011 FINAL   Final   MRSA PCR SCREENING  Status: Normal   Collection Time   07/03/11 11:33 PM      Component Value Range Status Comment   MRSA by PCR NEGATIVE  NEGATIVE  Final     Studies/Results: No results found.  Medications: Scheduled Meds:   . carvedilol  3.125 mg Oral  Q12H  . enoxaparin  0.5 mg/kg Subcutaneous QHS  . furosemide  40 mg Oral BID  . lisinopril  5 mg Oral Daily  . potassium chloride  40 mEq Oral BID  . QUEtiapine  600 mg Oral QHS  . sertraline  150 mg Oral Daily  . sodium chloride  3 mL Intravenous Q12H  . traZODone  150 mg Oral QHS  . ziprasidone  160 mg Oral QHS  . zolpidem  10 mg Oral QHS  . DISCONTD: furosemide  40 mg Intravenous Q12H   Continuous Infusions:  PRN Meds:.acetaminophen, cyclobenzaprine, ondansetron (ZOFRAN) IV, oxyCODONE, sodium chloride  Assessment/Plan:  Active Problems:  Morbid obesity  Bipolar 1 disorder  ARF (acute renal failure)  Acute systolic heart failure  HTN (hypertension)  Secondary cardiomyopathy, unspecified  This lady was admitted to the hospital for progressive edema and shortness of breath.  She was found to have acute systolic CHF.  1. Acute systolic CHF.  Echo reveals EF of 10-15% with diffuse hypokinesis.  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  Cardiology is following. She has been diuresing with lasix.  Her weight has decreased approximately 9kg since admission.  She still does appear to have a fair amount of fluid on her.  Her creatinine did have a slight bump with diuresis, so we had to change her lasix to po.  I wonder if she would benefit from further inotropic support for diuresis.  Will defer this decision to cardiology. I think we need to continue with diuresis for now. She has been started on a betablocker and an ACE.  The remainder of her medical problems have remained stable   LOS: 4 days   Cecilie Heidel [facility] Pager: [phone] 07/07/2011, 10:24 AM

## 2011-07-08 LAB — BASIC METABOLIC PANEL
BUN: 16 mg/dL (ref 6–23)
Chloride: 98 mEq/L (ref 96–112)
GFR calc Af Amer: 54 mL/min — ABNORMAL LOW (ref >90)
Potassium: 4.1 mEq/L (ref 3.5–5.1)
Sodium: 140 mEq/L (ref 135–145)

## 2011-07-08 NOTE — Progress Notes (Signed)
   CARE MANAGEMENT NOTE 07/08/2011  Patient:  Karla Anderson, Karla Anderson   Account Number:  [id number]  Date Initiated:  07/04/2011  Documentation initiated by:  Rosemary Holms  Subjective/Objective Assessment:   pt admitted with SOB, heart failure. PTA lived as home with family (Mother, Daughter and Boyfriend also assists)     Action/Plan:   Spoke to pt at bedside. No HH DME needs identified at this time.  07/08/11 pt requests a BSC   Anticipated DC Date:  07/09/2011   Anticipated DC Plan:  HOME W HOME HEALTH SERVICES      DC Planning Services  CM consult      Choice offered to / List presented to:     DME arranged  BEDSIDE COMMODE      DME agency  [facility]     [facility] arranged  HH-1 RN  HH-10 DISEASE MANAGEMENT  HH-2 PT      [facility] agency  [facility]   Status of service:  In process, will continue to follow Medicare Important Message given?   (If response is "NO", the following Medicare IM given date fields will be blank) Date Medicare IM given:   Date Additional Medicare IM given:    Discharge Disposition:    Per UR Regulation:    If discussed at Long Length of Stay Meetings, dates discussed:    Comments:  07/08/11 1100 Audra Bellard RN BSN CM  07/04/11 1400 Rafaela Dinius Leanord Hawking RN BSN CM

## 2011-07-08 NOTE — Progress Notes (Signed)
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 She has had a very good diuresis and has lost just over 10 kg since admission. She certainly feels better with this.           Physical Exam: Blood pressure 116/79, pulse 99, temperature 97.7 F (36.5 C), temperature source Oral, resp. rate 18, height 5\' 5"  (1.651 m), weight 139.753 kg (308 lb 1.6 oz), SpO2 94.00%.  Lung fields are clear. Heart sounds are present and normal without gallop rhythm. Jugular venous pressure not elevated. She does have peripheral pitting edema, which I presume is better than when she was admitted.   Investigations:  Recent Results (from the past 240 hour(s))  URINE CULTURE     Status: Normal   Collection Time   07/03/11  2:52 PM      Component Value Range Status Comment   Specimen Description URINE, CLEAN CATCH   Final    Special Requests NONE   Final    Culture  Setup Time [id number]   Final    Colony Count 70,000 COLONIES/ML   Final    Culture     Final    Value: Multiple bacterial morphotypes present, none predominant. Suggest appropriate recollection if clinically indicated.   Report Status 07/05/2011 FINAL   Final   MRSA PCR SCREENING     Status: Normal   Collection Time   07/03/11 11:33 PM      Component Value Range Status Comment   MRSA by PCR NEGATIVE  NEGATIVE  Final      Basic Metabolic Panel:  Basename 07/08/11 0521 07/07/11 0526  NA 140 140  K 4.1 4.0  CL 98 97  CO2 38* 39*  GLUCOSE 84 82  BUN 16 16  CREATININE 1.39* 1.38*  CALCIUM 8.6 8.5  MG -- --  PHOS -- --          Medications: I have reviewed the patient's current medications.  Impression:  1. Acute systolic congestive heart failure, improving clinically. 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 2. Acute renal failure, stabilizing. 3. Hypertension, controlled. 4. Morbid obesity.       Plan:  1. Continue with current therapy. 2. Monitor renal function closely and probable discharge home tomorrow.      LOS: 5 days   Wilson Singer Pager [phone]  07/08/2011, 10:04 AM

## 2011-07-08 NOTE — Progress Notes (Signed)
Initial visit offering emotional and spiritual support for patient.  Patient began sharing her fears about her diagnosis along with discussion about loss issues.  Discussed coping and support she has currently.  Visit was shortened by visitors and will follow-up to,orrow.

## 2011-07-08 NOTE — Progress Notes (Signed)
Patient ID: DANIELA SIEBERS, female   DOB: November 30, 1970, 41 y.o.   MRN: [id number]   Patient Name: Karla Anderson Date of Encounter: 07/08/2011    SUBJECTIVE Less Sob. Good diuresis over the weekend  CURRENT MEDS    . carvedilol  3.125 mg Oral Q12H  . enoxaparin  0.5 mg/kg Subcutaneous QHS  . furosemide  40 mg Oral BID  . lisinopril  5 mg Oral Daily  . potassium chloride  40 mEq Oral BID  . QUEtiapine  600 mg Oral QHS  . sertraline  150 mg Oral Daily  . sodium chloride  3 mL Intravenous Q12H  . traZODone  150 mg Oral QHS  . ziprasidone  160 mg Oral QHS  . zolpidem  10 mg Oral QHS    OBJECTIVE  Filed Vitals:   07/07/11 1300 07/07/11 1819 07/07/11 2122 07/08/11 0522  BP: 99/69 119/84 104/72 103/67  Pulse: 100 92 97 84  Temp: 98.1 F (36.7 C) 98 F (36.7 C) 98.1 F (36.7 C) 97.7 F (36.5 C)  TempSrc: Oral Oral Oral Oral  Resp: 18 18 18 18   Height:      Weight:    308 lb 1.6 oz (139.753 kg)  SpO2: 96% 93% 95% 93%    Intake/Output Summary (Last 24 hours) at 07/08/11 0910 Last data filed at 07/08/11 0523  Gross per 24 hour  Intake   1000 ml  Output   4125 ml  Net  -3125 ml   Filed Weights   07/06/11 0617 07/07/11 0702 07/08/11 0522  Weight: 316 lb 6.4 oz (143.518 kg) 312 lb (141.522 kg) 308 lb 1.6 oz (139.753 kg)    PHYSICAL EXAM  General: Pleasant, NAD, morbidly obese Neuro: Alert and oriented X 3. Moves all extremities spontaneously. Psych: Normal affect. HEENT:  Normal  Neck: Supple without bruits or JVD. Lungs:  Resp regular and unlabored, CTA. Heart: RRR no s3, s4, or murmurs. Abdomen: Soft, non-tender, non-distended, BS + x 4.  Extremities: No clubbing, cyanosis, 2 + edema, chronic edematous changes, DP/PT/Radials 2+ and equal bilaterally.  Accessory Clinical Findings  CBC No results found for this basename: WBC:2,NEUTROABS:2,HGB:2,HCT:2,MCV:2,PLT:2 in the last 72 hours Basic Metabolic Panel  Basename 07/08/11 0521 07/07/11 0526  NA 140 140    K 4.1 4.0  CL 98 97  CO2 38* 39*  GLUCOSE 84 82  BUN 16 16  CREATININE 1.39* 1.38*  CALCIUM 8.6 8.5  MG -- --  PHOS -- --   Liver Function Tests No results found for this basename: AST:2,ALT:2,ALKPHOS:2,BILITOT:2,PROT:2,ALBUMIN:2 in the last 72 hours No results found for this basename: LIPASE:2,AMYLASE:2 in the last 72 hours Cardiac Enzymes No results found for this basename: CKTOTAL:3,CKMB:3,CKMBINDEX:3,TROPONINI:3 in the last 72 hours BNP No components found with this basename: POCBNP:3 D-Dimer No results found for this basename: DDIMER:2 in the last 72 hours Hemoglobin A1C No results found for this basename: HGBA1C in the last 72 hours Fasting Lipid Panel No results found for this basename: CHOL,HDL,LDLCALC,TRIG,CHOLHDL,LDLDIRECT in the last 72 hours Thyroid Function Tests No results found for this basename: TSH,T4TOTAL,FREET3,T3FREE,THYROIDAB in the last 72 hours  TELE  NSR  ECG    Radiology/Studies  Nm Pulmonary Per & Vent  07/03/2011  *RADIOLOGY REPORT*  Clinical Data:  41 year old female with shortness of breath.  NUCLEAR MEDICINE VENTILATION - PERFUSION LUNG SCAN  Technique:  Wash-in, equilibrium, and wash-out phase ventilation images were obtained using Xe-133 gas.  Perfusion images were obtained in multiple projections after intravenous injection of Tc-  29m MAA.  Radiopharmaceuticals:  8 mCi Xe-133 gas and 3.3 mCi Tc-57m MAA.  Comparison:  Portable chest radiograph from 1450 hours the same day.  Findings: Comparison chest radiograph demonstrates cardiomegaly and basilar hypoventilation.  Ventilation imaging demonstrates prominent photopenic defect from cardiomegaly.  No other ventilation defect.  Widespread gas trapping.  Perfusion imaging demonstrates photopenic defect from cardiomegaly. No other perfusion defect.  IMPRESSION: Very low probability of acute pulmonary embolus.  Original Report Authenticated By: Harley Hallmark, M.D.   Dg Chest Port 1 View  07/03/2011   *RADIOLOGY REPORT*  Clinical Data: Cough.  Congestive heart failure.  Short of breath.  PORTABLE CHEST - 1 VIEW  Comparison: 11/23/2010  Findings: Cardiac silhouette remains enlarged.  Film is under penetrated.  Allowing for that, I think the lungs are clear. Cannot completely rule out basilar lung disease, particularly on the left.  IMPRESSION: No active disease seen.  Enlarged cardiac silhouette.  Poor visualization of the left lung base because of technical factors.  Original Report Authenticated By: Thomasenia Sales, M.D.    ASSESSMENT AND PLAN  Active Problems:  Morbid obesity  Bipolar 1 disorder  ARF (acute renal failure)  Acute systolic heart failure  HTN (hypertension)  Secondary cardiomyopathy, unspecified   Doing better with excellent diuresis. Need to continue current meds and prepare for discharge. Would discontinue Foley to avoid UTI. Will need dietary consult though I reviewed low sodium diet. Would involve Care Management to try to provide home needs and try to keep out of the hospital.  Signed, Valera Castle MD

## 2011-07-09 LAB — CBC
HCT: 35.3 % — ABNORMAL LOW (ref 36.0–46.0)
Hemoglobin: 10.8 g/dL — ABNORMAL LOW (ref 12.0–15.0)
RBC: 4 MIL/uL (ref 3.87–5.11)
WBC: 5.7 10*3/uL (ref 4.0–10.5)

## 2011-07-09 LAB — COMPREHENSIVE METABOLIC PANEL
ALT: 11 U/L (ref 0–35)
Alkaline Phosphatase: 136 U/L — ABNORMAL HIGH (ref 39–117)
BUN: 18 mg/dL (ref 6–23)
CO2: 38 mEq/L — ABNORMAL HIGH (ref 19–32)
Chloride: 95 mEq/L — ABNORMAL LOW (ref 96–112)
GFR calc Af Amer: 53 mL/min — ABNORMAL LOW (ref >90)
Glucose, Bld: 71 mg/dL (ref 70–99)
Potassium: 3.9 mEq/L (ref 3.5–5.1)
Total Bilirubin: 0.5 mg/dL (ref 0.3–1.2)

## 2011-07-09 MED ORDER — CARVEDILOL 3.125 MG PO TABS: 3.1250 mg | ORAL_TABLET | Freq: Two times a day (BID) | ORAL | Status: DC

## 2011-07-09 MED ORDER — POTASSIUM CHLORIDE CRYS ER 20 MEQ PO TBCR: 40.0000 meq | EXTENDED_RELEASE_TABLET | Freq: Two times a day (BID) | ORAL | Status: DC

## 2011-07-09 MED ORDER — LISINOPRIL 5 MG PO TABS: 5.0000 mg | ORAL_TABLET | Freq: Every day | ORAL | Status: DC

## 2011-07-09 MED ORDER — FUROSEMIDE 40 MG PO TABS: 40.0000 mg | ORAL_TABLET | Freq: Two times a day (BID) | ORAL | Status: DC

## 2011-07-09 NOTE — Discharge Summary (Signed)
Physician Discharge Summary  Patient ID: Karla Anderson MRN: [id number] DOB/AGE: 1970/09/28 41 y.o. : Admit date: 07/03/2011 Discharge date: 07/09/2011    Discharge Diagnoses:  1. Acute systolic congestive heart failure, clinically improved. Ejection fraction 10-15% with diffuse hypokinesis. 2. Hypertension. 3. Acute on chronic kidney disease. 4. Bipolar disorder. 5. Morbid obesity.   Medication List  As of 07/09/2011 10:07 AM   TAKE these medications         ADULT ONE DAILY GUMMIES Chew   Chew 2 each by mouth every morning.      carvedilol 3.125 MG tablet   Commonly known as: COREG   Take 1 tablet (3.125 mg total) by mouth every 12 (twelve) hours.      cyclobenzaprine 10 MG tablet   Commonly known as: FLEXERIL   Take 10 mg by mouth 3 (three) times daily as needed. FOR MUSCLE SPASMS      furosemide 40 MG tablet   Commonly known as: LASIX   Take 1 tablet (40 mg total) by mouth 2 (two) times daily.      HYDROcodone-acetaminophen 5-325 MG per tablet   Commonly known as: NORCO   Take 1 tablet by mouth every 6 (six) hours as needed. FOR PAIN      lisinopril 5 MG tablet   Commonly known as: PRINIVIL,ZESTRIL   Take 1 tablet (5 mg total) by mouth daily.      potassium chloride SA 20 MEQ tablet   Commonly known as: K-DUR,KLOR-CON   Take 2 tablets (40 mEq total) by mouth 2 (two) times daily.      QUEtiapine 300 MG tablet   Commonly known as: SEROQUEL   Take 600 mg by mouth at bedtime.      sertraline 100 MG tablet   Commonly known as: ZOLOFT   Take 150 mg by mouth daily.      traZODone 150 MG tablet   Commonly known as: DESYREL   Take 150 mg by mouth at bedtime.      ziprasidone 80 MG capsule   Commonly known as: GEODON   Take 160 mg by mouth at bedtime.      zolpidem 10 MG tablet   Commonly known as: AMBIEN   Take 10 mg by mouth at bedtime.            Discharged Condition: Stable and improved.    Consults: Cardiology, Dr. Dietrich Pates.  Significant  Diagnostic Studies: Nm Pulmonary Per & Vent  07/03/2011  *RADIOLOGY REPORT*  Clinical Data:  41 year old female with shortness of breath.  NUCLEAR MEDICINE VENTILATION - PERFUSION LUNG SCAN  Technique:  Wash-in, equilibrium, and wash-out phase ventilation images were obtained using Xe-133 gas.  Perfusion images were obtained in multiple projections after intravenous injection of Tc- 50m MAA.  Radiopharmaceuticals:  8 mCi Xe-133 gas and 3.3 mCi Tc-68m MAA.  Comparison:  Portable chest radiograph from 1450 hours the same day.  Findings: Comparison chest radiograph demonstrates cardiomegaly and basilar hypoventilation.  Ventilation imaging demonstrates prominent photopenic defect from cardiomegaly.  No other ventilation defect.  Widespread gas trapping.  Perfusion imaging demonstrates photopenic defect from cardiomegaly. No other perfusion defect.  IMPRESSION: Very low probability of acute pulmonary embolus.  Original Report Authenticated By: Harley Hallmark, M.D.   Dg Chest Port 1 View  07/03/2011  *RADIOLOGY REPORT*  Clinical Data: Cough.  Congestive heart failure.  Short of breath.  PORTABLE CHEST - 1 VIEW  Comparison: 11/23/2010  Findings: Cardiac silhouette remains enlarged.  Film is under  penetrated.  Allowing for that, I think the lungs are clear. Cannot completely rule out basilar lung disease, particularly on the left.  IMPRESSION: No active disease seen.  Enlarged cardiac silhouette.  Poor visualization of the left lung base because of technical factors.  Original Report Authenticated By: Thomasenia Sales, M.D.      2D Echocardiogram with contrast      Ordering Physician:  Vania Rea, MD  Order# [id number]  Study Date:  07/05/11      Patient Information      Name  MRN   Description     Karla Anderson  [id number]   41 year old Female              Result Narrative     *[facility]* [address] [place], [place]  [address] [phone]  ------------------------------------------------------------ Transthoracic Echocardiography  Patient: Karla, Anderson MR #: [id number] Study Date: 07/04/2011 Gender: F Age: 81 Height: 165.1cm Weight: 149.7kg BSA: 2.26m^2 Pt. Status: Room: A311  SONOGRAPHER Karrie Doffing ORDERING Vania Rea REFERRING Vania Rea PERFORMING Delma Freeze Penn ATTENDING Mcmanus, Joesph Fillers cc:  ------------------------------------------------------------ LV EF: 10% - 15%  ------------------------------------------------------------ Indications: CHF - 428.0.  ------------------------------------------------------------ History: PMH: Bipolar, Acute renal failure Risk factors: Hypertension.  ------------------------------------------------------------ Study Conclusions  - Left ventricle: The cavity size was moderately dilated. Wall thickness was normal. Systolic function was severely reduced. The estimated ejection fraction was in the range of 10% to 15%. Severe diffuse hypokinesis. - Mitral valve: Mild regurgitation. - Left atrium: The atrium was moderately dilated. - Right ventricle: The cavity size was mildly dilated. Systolic function was mildly to moderately reduced. - Right atrium: The atrium was mildly to moderately dilated. - Atrial septum: No defect or patent foramen ovale was identified. - Pulmonary arteries: Systolic pressure was mildly increased. PA peak pressure: 58mm Hg (S). - Pericardium, extracardiac: A trivial pericardial effusion was identified posterior to the heart. There was a small right pleural effusion. Transthoracic echocardiography. M-mode, complete 2D, spectral Doppler, and color Doppler. Height: Height: 165.1cm. Height: 65in. Weight: Weight: 149.7kg. Weight: 329.3lb. Body mass index: BMI: 54.9kg/m^2. Body surface area: BSA: 2.74m^2. Patient status: Inpatient. Location: Bedside.      Lab  Results: Basic Metabolic Panel:  Basename 07/09/11 0503 07/08/11 0521  NA 138 140  K 3.9 4.1  CL 95* 98  CO2 38* 38*  GLUCOSE 71 84  BUN 18 16  CREATININE 1.43* 1.39*  CALCIUM 8.7 8.6  MG -- --  PHOS -- --   Liver Function Tests:  Basename 07/09/11 0503  AST 20  ALT 11  ALKPHOS 136*  BILITOT 0.5  PROT 5.9*  ALBUMIN 2.7*     CBC:  Basename 07/09/11 0503  WBC 5.7  NEUTROABS --  HGB 10.8*  HCT 35.3*  MCV 88.3  PLT 196    Recent Results (from the past 240 hour(s))  URINE CULTURE     Status: Normal   Collection Time   07/03/11  2:52 PM      Component Value Range Status Comment   Specimen Description URINE, CLEAN CATCH   Final    Special Requests NONE   Final    Culture  Setup Time [id number]   Final    Colony Count 70,000 COLONIES/ML   Final    Culture     Final    Value: Multiple bacterial morphotypes present, none predominant. Suggest appropriate recollection if clinically indicated.   Report Status 07/05/2011 FINAL   Final  MRSA PCR SCREENING     Status: Normal   Collection Time   07/03/11 11:33 PM      Component Value Range Status Comment   MRSA by PCR NEGATIVE  NEGATIVE  Final      Hospital Course: This 41 year old lady, who is morbidly obese, presents to the hospital with symptoms of dyspnea for 2 weeks. She also had significant lower extremity swelling. She was found to be clinically in congestive heart failure. An echocardiogram was done which showed extremely poor ejection fraction of only 10-15% with diffuse hypokinesis. 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 She was appropriately started on intravenous diuretics and she has lost significant weight since admission. In fact she has lost a total of 25 pounds since admission 6 days ago. With diuretic therapy, her renal function has deteriorated to some degree but I think this should settle itself down, provided she has adequate followup.  Discharge  Exam: Blood pressure 128/83, pulse 88, temperature 97.5 F (36.4 C), temperature source Oral, resp. rate 20, height 5\' 5"  (1.651 m), weight 138.529 kg (305 lb 6.4 oz), SpO2 97.00%. She looks morbidly obese but systemically well. There is no increased work of breathing. Lung fields are clear. Heart sounds are present and normal without murmurs. Jugular venous pressure not elevated. She does have still pitting edema in both her legs but this is less so than previously. She is alert and orientated.  Disposition: Home. She will be seen by cardiology in the next couple weeks where adjustments to her medications can be made as necessary and also based on renal function.  Discharge Orders    Future Appointments: Provider: Department: Dept Phone: Center:   07/23/2011 11:40 AM Jodelle Gross, NP [facility] [phone] [id number]     Future Orders Please Complete By Expires   Diet - low sodium heart healthy      Increase activity slowly         Follow-up Information    Follow up with Joni Reining, NP on 07/23/2011. (11:40 AM [facility] office)    Contact information:   [address] [place] [address] [phone]          Signed: Wilson Singer Pager [phone]  07/09/2011, 10:07 AM

## 2011-07-09 NOTE — Progress Notes (Signed)
Pt d/c instructions, medications and follow up appts discussed with pt.  No issues or concerns addressed.  Pt d/ced home at this time with family.  Transported out of facility by w/c with staff.

## 2011-07-09 NOTE — Progress Notes (Signed)
   CARE MANAGEMENT NOTE 07/09/2011  Patient:  Karla Anderson, Karla Anderson   Account Number:  [id number]  Date Initiated:  07/04/2011  Documentation initiated by:  Rosemary Holms  Subjective/Objective Assessment:   pt admitted with SOB, heart failure. PTA lived as home with family (Mother, Daughter and Boyfriend also assists)     Action/Plan:   Spoke to pt at bedside. No HH DME needs identified at this time.  07/08/11 pt requests a BSC   Anticipated DC Date:  07/09/2011   Anticipated DC Plan:  HOME W HOME HEALTH SERVICES      DC Planning Services  CM consult      Choice offered to / List presented to:     DME arranged  BEDSIDE COMMODE  WHEELCHAIR - MANUAL      DME agency  [facility]     [facility] arranged  HH-1 RN  HH-10 DISEASE MANAGEMENT  HH-2 PT      HH agency  [facility]   Status of service:  Completed, signed off Medicare Important Message given?   (If response is "NO", the following Medicare IM given date fields will be blank) Date Medicare IM given:   Date Additional Medicare IM given:    Discharge Disposition:  HOME W HOME HEALTH SERVICES  Per UR Regulation:    If discussed at Long Length of Stay Meetings, dates discussed:    Comments:  07/09/11 1200 Ayano Douthitt RN BSN CM HH to begin as soon as pt has her Medicaid Card changed from the Health Department to Dr. Felecia Shelling. Pt instructed to contact Medicaid ASAP and then call to schedule a follow up visit ASAP. DME to be delivered to pt's home today.  07/08/11 1100 Giacomo Valone RN BSN CM  07/04/11 1400 Everest Brod Leanord Hawking RN BSN CM

## 2011-07-19 ENCOUNTER — Encounter: Payer: Self-pay | Admitting: Adult Health

## 2011-07-23 ENCOUNTER — Encounter: Payer: Self-pay | Admitting: Adult Health

## 2011-07-23 ENCOUNTER — Ambulatory Visit (INDEPENDENT_AMBULATORY_CARE_PROVIDER_SITE_OTHER): Payer: Medicaid Other | Admitting: Adult Health

## 2011-07-23 VITALS — BP 125/91 | HR 103 | Resp 18 | Ht 65.0 in | Wt 271.0 lb

## 2011-07-23 DIAGNOSIS — I5021 Acute systolic (congestive) heart failure: Secondary | ICD-10-CM

## 2011-07-23 DIAGNOSIS — I10 Essential (primary) hypertension: Secondary | ICD-10-CM

## 2011-07-23 DIAGNOSIS — N179 Acute kidney failure, unspecified: Secondary | ICD-10-CM

## 2011-07-23 DIAGNOSIS — I5022 Chronic systolic (congestive) heart failure: Secondary | ICD-10-CM

## 2011-07-23 MED ORDER — CARVEDILOL 6.25 MG PO TABS: 6.2500 mg | ORAL_TABLET | Freq: Two times a day (BID) | ORAL | Status: DC

## 2011-07-23 MED ORDER — POTASSIUM CHLORIDE CRYS ER 20 MEQ PO TBCR: 40.0000 meq | EXTENDED_RELEASE_TABLET | Freq: Two times a day (BID) | ORAL | Status: DC

## 2011-07-23 NOTE — Assessment & Plan Note (Signed)
BMET is being checked in one week. Consider adding spironolactone to meds if kidney fix will allow.

## 2011-07-23 NOTE — Patient Instructions (Signed)
**Note De-Identified  Obfuscation** Your physician has requested that you have an echocardiogram. Echocardiography is a painless test that uses sound waves to create images of your heart. It provides your doctor with information about the size and shape of your heart and how well your heart's chambers and valves are working. This procedure takes approximately one hour. There are no restrictions for this procedure. This will be scheduled after your next office visit.  Your physician has recommended you make the following change in your medication: increase Coreg (Carvedilol) to 6.25 mg twice daily  Your physician recommends that you return for lab work in: 1 week (07-30-11)  Your physician recommends that you schedule a follow-up appointment in: 1 month

## 2011-07-23 NOTE — Assessment & Plan Note (Signed)
She remains medically complaint and has lost an additional 10 lbs since discharge. She has some complaints of palpitations when she lies down at night and when she is up walking on occasion. This is probably related to deconditioning and hypoventaoation syndrome. I will increase the coreg to 6.25 mg BID to continue to optimize medical therapy. Would titrate to highest dose tolerated per BP response. She will continue on ACE inhibitor. Consider adding spironolactone if kidney fx improves. I will check a BMET in one week. She will need a repeat Echo in 6 weeks.  Consideration for ICD pacemaker if she shows no improvement in LV fx on optimized medical therapy. I have reinforced low sodium diet.  She will, at some point, need to have sleep study completed for sleep apnea.

## 2011-07-23 NOTE — Progress Notes (Signed)
HPI:Karla Anderson is a morbidly obese 41 y/o patient of Dr. Diona Browner we are seeing for ongoing assessment and treatment of severe, nonischemic systolic dysfunction with most recent echo in April of 2013 demonstrating EF of 10-15%. She was recently admitted to [facility] for acute CHF where she was newly diagnosed as above. She was diuresed 25 lbs and placed on coreg 3.125 BID, lisinopril 5 mg, lasix 40 mg BID. She has a history of renal insufficiency from low cardiac output. She was not placed on spironolactone on discharge. Most recent creatinine 1.43 on discharge.    She has been medically compliant and has lost an additional 10 lbs since discharge. She denies chest pain or significant dyspnea on exertion, however, she is not ambulating very well secondary to her obesity and chronic deconditioning;  therefore is using a wheel chair to get around. She is planning to get married in 3 weeks.  No Known Allergies  Current Outpatient Prescriptions  Medication Sig Dispense Refill  . carvedilol (COREG) 6.25 MG tablet Take 1 tablet (6.25 mg total) by mouth every 12 (twelve) hours.  60 tablet  3  . cyclobenzaprine (FLEXERIL) 10 MG tablet Take 10 mg by mouth 3 (three) times daily as needed. FOR MUSCLE SPASMS      . furosemide (LASIX) 40 MG tablet Take 1 tablet (40 mg total) by mouth 2 (two) times daily.  60 tablet  0  . HYDROcodone-acetaminophen (NORCO) 5-325 MG per tablet Take 1 tablet by mouth every 6 (six) hours as needed. FOR PAIN      . lisinopril (PRINIVIL,ZESTRIL) 5 MG tablet Take 1 tablet (5 mg total) by mouth daily.  30 tablet  0  . Multiple Vitamins-Minerals (ADULT ONE DAILY GUMMIES) CHEW Chew 2 each by mouth every morning.      . potassium chloride SA (K-DUR,KLOR-CON) 20 MEQ tablet Take 2 tablets (40 mEq total) by mouth 2 (two) times daily.  60 tablet  0  . QUEtiapine (SEROQUEL) 300 MG tablet Take 600 mg by mouth at bedtime.        . sertraline (ZOLOFT) 100 MG tablet Take 150 mg by mouth daily.       .  traZODone (DESYREL) 150 MG tablet Take 150 mg by mouth at bedtime.        . ziprasidone (GEODON) 80 MG capsule Take 160 mg by mouth at bedtime.        Marland Kitchen zolpidem (AMBIEN) 10 MG tablet Take 10 mg by mouth at bedtime.        Marland Kitchen DISCONTD: carvedilol (COREG) 3.125 MG tablet Take 1 tablet (3.125 mg total) by mouth every 12 (twelve) hours.  60 tablet  0  . DISCONTD: carvedilol (COREG) 6.25 MG tablet Take 1 tablet (6.25 mg total) by mouth every 12 (twelve) hours.  60 tablet  3  . DISCONTD: carvedilol (COREG) 6.25 MG tablet Take 1 tablet (6.25 mg total) by mouth every 12 (twelve) hours.  60 tablet  3  . DISCONTD: potassium chloride SA (K-DUR,KLOR-CON) 20 MEQ tablet Take 2 tablets (40 mEq total) by mouth 2 (two) times daily.  60 tablet  0    Past Medical History  Diagnosis Date  . Tachycardia     Rate related LBBB - seen by Dr. Graciela Husbands and Dr. Dietrich Pates in the past  . Anxiety   . Depression   . Heart murmur     No reported valvular disease  . Back pain   . Bipolar 1 disorder   . Arthritis  Bilateral knee    Past Surgical History  Procedure Date  . Cesarean section   . Carpel tunn Carpel tunnel sx  . Tubal ligation   . Tubal ligation reversal   . Cholecystectomy 11/09/2010    Procedure: LAPAROSCOPIC CHOLECYSTECTOMY;  Surgeon: Fabio Bering;  Location: [facility];  Service: General;  Laterality: N/A;  . Esophagogastroduodenoscopy 11/19/2010    Procedure: ESOPHAGOGASTRODUODENOSCOPY (EGD);  Surgeon: Malissa Hippo, MD;  Location: [facility];  Service: Endoscopy;  Laterality: N/A;  . Laparotomy 11/19/2010    Procedure: EXPLORATORY LAPAROTOMY;  Surgeon: Fabio Bering;  Location: [facility];  Service: General;  Laterality: N/A;  . Gastrectomy 11/19/2010    Procedure: GASTRECTOMY;  Surgeon: Fabio Bering;  Location: [facility];  Service: General;  Laterality: N/A;  Partial Gastrectomy  . Gastrojejunostomy 11/19/2010    Procedure: GASTROJEJUNOSTOMY;  Surgeon: Fabio Bering;  Location: [facility];  Service:  General;  Laterality: N/A;  Roux-En-Y    WUJ:WJXBJY of systems complete and found to be negative unless listed above PHYSICAL EXAM BP 125/91  Pulse 103  Resp 18  Ht 5\' 5"  (1.651 m)  Wt 271 lb (122.925 kg)  BMI 45.10 kg/m2  General: Well developed, well nourished, morbidly obese in no acute distress, sitting in a wheelchair. Head: Eyes PERRLA, No xanthomas.   Normal cephalic and atramatic Lungs: Clear bilaterally to auscultation and percussion. Heart: HRRR S1 S2, distant with soft systolic murmur.  Pulses are 2+ & equal.            No carotid bruit. No JVD.  No abdominal bruits. No femoral bruits. Abdomen: Bowel sounds are positive, abdomen soft and non-tender without masses or                  Hernia's noted. Msk:  Back normal, normal gait. Normal strength and tone for age. Extremities: No clubbing, cyanosis, she has woody edema in the lower pretibial area, but no edema mid calf and above..  DP +1 Neuro: Alert and oriented X 3. Psych:  Good affect, responds appropriately    ASSESSMENT AND PLAN

## 2011-07-31 LAB — BASIC METABOLIC PANEL
Calcium: 9.1 mg/dL (ref 8.4–10.5)
Creat: 1.39 mg/dL — ABNORMAL HIGH (ref 0.50–1.10)
Sodium: 138 mEq/L (ref 135–145)

## 2011-08-09 ENCOUNTER — Other Ambulatory Visit: Payer: Self-pay | Admitting: Adult Health

## 2011-08-09 MED ORDER — LISINOPRIL 5 MG PO TABS: 5.0000 mg | ORAL_TABLET | Freq: Every day | ORAL | Status: DC

## 2011-08-09 MED ORDER — FUROSEMIDE 40 MG PO TABS: 40.0000 mg | ORAL_TABLET | Freq: Two times a day (BID) | ORAL | Status: DC

## 2011-08-19 ENCOUNTER — Other Ambulatory Visit (HOSPITAL_COMMUNITY): Payer: Medicaid Other

## 2011-08-27 ENCOUNTER — Encounter: Payer: Self-pay | Admitting: Adult Health

## 2011-08-27 ENCOUNTER — Ambulatory Visit (INDEPENDENT_AMBULATORY_CARE_PROVIDER_SITE_OTHER): Payer: Medicaid Other | Admitting: Adult Health

## 2011-08-27 ENCOUNTER — Ambulatory Visit: Payer: Medicaid Other | Admitting: Adult Health

## 2011-08-27 VITALS — BP 118/85 | HR 82 | Resp 18 | Ht 65.0 in | Wt 271.0 lb

## 2011-08-27 DIAGNOSIS — I5022 Chronic systolic (congestive) heart failure: Secondary | ICD-10-CM

## 2011-08-27 DIAGNOSIS — I5021 Acute systolic (congestive) heart failure: Secondary | ICD-10-CM

## 2011-08-27 MED ORDER — POTASSIUM CHLORIDE CRYS ER 20 MEQ PO TBCR: 40.0000 meq | EXTENDED_RELEASE_TABLET | Freq: Every day | ORAL | Status: DC

## 2011-08-27 MED ORDER — SPIRONOLACTONE 25 MG PO TABS: 12.5000 mg | ORAL_TABLET | Freq: Every day | ORAL | Status: DC

## 2011-08-27 NOTE — Patient Instructions (Signed)
Your physician recommends that you schedule a follow-up appointment in: 1 month  Your physician has recommended you make the following change in your medication:  1 - START Spironolactone 12.5 mg daily 2 - DECREASE Lasix to Once daily  Your physician recommends that you return for lab work in: 1 week

## 2011-08-27 NOTE — Progress Notes (Signed)
HPI:Karla Anderson is a morbidly obese 41 y/o patient of Dr. Diona Browner we are seeing for ongoing assessment and treatment of severe, nonischemic systolic dysfunction with most recent echo in April of 2013 demonstrating EF of 10-15%. She was recently admitted to [facility] for acute CHF where she was newly diagnosed as above. She was diuresed 25 lbs and placed on coreg 3.125 BID, lisinopril 5 mg, lasix 40 mg BID. She is here on follow-up after increasing coreg to 6.25 mg BID. BMET was drawn. She is out of the wheel chair that she was using and has become more ambulatory. She is to be married in one week.  She has occasional complaints of sharp pain midsternal without dyspnea or weakness.  Chronic LEE. No Known Allergies  Current Outpatient Prescriptions  Medication Sig Dispense Refill  . carvedilol (COREG) 6.25 MG tablet Take 1 tablet (6.25 mg total) by mouth every 12 (twelve) hours.  60 tablet  3  . cyclobenzaprine (FLEXERIL) 10 MG tablet Take 10 mg by mouth 3 (three) times daily as needed. FOR MUSCLE SPASMS      . furosemide (LASIX) 40 MG tablet Take 1 tablet (40 mg total) by mouth 2 (two) times daily.  60 tablet  0  . HYDROcodone-acetaminophen (NORCO) 5-325 MG per tablet Take 1 tablet by mouth every 6 (six) hours as needed. FOR PAIN      . lisinopril (PRINIVIL,ZESTRIL) 5 MG tablet Take 1 tablet (5 mg total) by mouth daily.  30 tablet  0  . Multiple Vitamins-Minerals (ADULT ONE DAILY GUMMIES) CHEW Chew 2 each by mouth every morning.      . potassium chloride SA (K-DUR,KLOR-CON) 20 MEQ tablet Take 2 tablets (40 mEq total) by mouth 2 (two) times daily.  60 tablet  0  . QUEtiapine (SEROQUEL) 300 MG tablet Take 600 mg by mouth at bedtime.        . sertraline (ZOLOFT) 100 MG tablet Take 150 mg by mouth daily.       . traZODone (DESYREL) 150 MG tablet Take 150 mg by mouth at bedtime.        . ziprasidone (GEODON) 80 MG capsule Take 160 mg by mouth at bedtime.        Marland Kitchen zolpidem (AMBIEN) 10 MG tablet Take 10 mg  by mouth at bedtime.          Past Medical History  Diagnosis Date  . Tachycardia     Rate related LBBB - seen by Dr. Graciela Husbands and Dr. Dietrich Pates in the past  . Anxiety   . Depression   . Heart murmur     No reported valvular disease  . Back pain   . Bipolar 1 disorder   . Arthritis     Bilateral knee    Past Surgical History  Procedure Date  . Cesarean section   . Carpel tunn Carpel tunnel sx  . Tubal ligation   . Tubal ligation reversal   . Cholecystectomy 11/09/2010    Procedure: LAPAROSCOPIC CHOLECYSTECTOMY;  Surgeon: Fabio Bering;  Location: [facility];  Service: General;  Laterality: N/A;  . Esophagogastroduodenoscopy 11/19/2010    Procedure: ESOPHAGOGASTRODUODENOSCOPY (EGD);  Surgeon: Malissa Hippo, MD;  Location: [facility];  Service: Endoscopy;  Laterality: N/A;  . Laparotomy 11/19/2010    Procedure: EXPLORATORY LAPAROTOMY;  Surgeon: Fabio Bering;  Location: [facility];  Service: General;  Laterality: N/A;  . Gastrectomy 11/19/2010    Procedure: GASTRECTOMY;  Surgeon: Fabio Bering;  Location: [facility];  Service: General;  Laterality: N/A;  Partial Gastrectomy  . Gastrojejunostomy 11/19/2010    Procedure: GASTROJEJUNOSTOMY;  Surgeon: Fabio Bering;  Location: [facility];  Service: General;  Laterality: N/A;  Roux-En-Y    WUJ:WJXBJY of systems complete and found to be negative unless listed above PHYSICAL EXAM BP 118/85  Pulse 82  Resp 18  Ht 5\' 5"  (1.651 m)  Wt 271 lb (122.925 kg)  BMI 45.10 kg/m2  General: Well developed, well nourished, morbidly obese in no acute distress, sitting in a wheelchair. Head: Eyes PERRLA, No xanthomas.   Normal cephalic and atramatic Lungs: Clear bilaterally to auscultation and percussion. Heart: HRRR S1 S2, distant with soft systolic murmur.  Pulses are 2+ & equal.            No carotid bruit. No JVD.  No abdominal bruits. No femoral bruits. Abdomen: Bowel sounds are positive, abdomen soft and non-tender without masses or                   Hernia's noted. Msk:  Back normal, normal gait. Normal strength and tone for age. Extremities: No clubbing, cyanosis, she has woody edema in the lower pretibial area, but no edema mid calf and above..Sock line and above.   DP +1 Neuro: Alert and oriented X 3. Psych:  Good affect, responds appropriately    ASSESSMENT AND PLAN

## 2011-08-27 NOTE — Assessment & Plan Note (Signed)
She is stable and tolerating the increased dose of coreg without symptoms. She has not gained wt since I saw her last.  Creatinine is 1.39. I will add low dose spironolactone 12.5 mg and decrease potassium dose to once a day. She will have follow-up BMET and echo which was previously planned. If EF is still 15% will refer to see Dr. Ladona Ridgel for ICD implantation.

## 2011-09-03 ENCOUNTER — Ambulatory Visit (HOSPITAL_COMMUNITY)
Admission: RE | Admit: 2011-09-03 | Discharge: 2011-09-03 | Disposition: A | Discharge status: Home / Self Care | Payer: Medicaid Other | Source: Ambulatory Visit | Attending: Adult Health | Admitting: Adult Health

## 2011-09-03 DIAGNOSIS — I5022 Chronic systolic (congestive) heart failure: Secondary | ICD-10-CM

## 2011-09-03 DIAGNOSIS — I501 Left ventricular failure: Secondary | ICD-10-CM | POA: Insufficient documentation

## 2011-09-03 DIAGNOSIS — N179 Acute kidney failure, unspecified: Secondary | ICD-10-CM | POA: Insufficient documentation

## 2011-09-03 DIAGNOSIS — I369 Nonrheumatic tricuspid valve disorder, unspecified: Secondary | ICD-10-CM

## 2011-09-03 DIAGNOSIS — I10 Essential (primary) hypertension: Secondary | ICD-10-CM | POA: Insufficient documentation

## 2011-09-03 LAB — BASIC METABOLIC PANEL
CO2: 25 mEq/L (ref 19–32)
Calcium: 8.9 mg/dL (ref 8.4–10.5)
Chloride: 108 mEq/L (ref 96–112)
Sodium: 140 mEq/L (ref 135–145)

## 2011-09-03 NOTE — Progress Notes (Signed)
*  PRELIMINARY RESULTS* Echocardiogram 2D Echocardiogram has been performed.  Karla Anderson 09/03/2011, 10:01 AM

## 2011-09-27 ENCOUNTER — Ambulatory Visit (INDEPENDENT_AMBULATORY_CARE_PROVIDER_SITE_OTHER): Payer: Medicaid Other | Admitting: Adult Health

## 2011-09-27 ENCOUNTER — Encounter: Payer: Self-pay | Admitting: Adult Health

## 2011-09-27 VITALS — BP 115/81 | HR 91 | Resp 18 | Ht 64.0 in | Wt 285.0 lb

## 2011-09-27 DIAGNOSIS — I10 Essential (primary) hypertension: Secondary | ICD-10-CM

## 2011-09-27 DIAGNOSIS — I5022 Chronic systolic (congestive) heart failure: Secondary | ICD-10-CM

## 2011-09-27 DIAGNOSIS — I5021 Acute systolic (congestive) heart failure: Secondary | ICD-10-CM

## 2011-09-27 MED ORDER — CARVEDILOL 12.5 MG PO TABS: 12.5000 mg | ORAL_TABLET | Freq: Two times a day (BID) | ORAL | Status: DC

## 2011-09-27 MED ORDER — SPIRONOLACTONE 25 MG PO TABS: 25.0000 mg | ORAL_TABLET | Freq: Every day | ORAL | Status: DC

## 2011-09-27 NOTE — Patient Instructions (Addendum)
**Note De-Identified  Obfuscation** Your physician has recommended you make the following change in your medication: increase Coreg to 12.5 mg twice daily, Spironolactone to 25 mg daily and Lasix to 40 mg three times daily for 3 days then go back to taking 40 mg twice daily.  Your physician recommends that you return for lab work on: Monday September 30, 2011  Your physician recommends that you schedule a follow-up appointment in: 3 months

## 2011-09-27 NOTE — Assessment & Plan Note (Signed)
Blood pressure is well controlled 

## 2011-09-27 NOTE — Assessment & Plan Note (Signed)
Weight is up 14 lbs since being seen last with symptoms of PND and abdominal fullness. She will be increased on her lasix for 3 days to 40 mg TID with increased dose of spironolactone to 25 mg daily. Coreg will be increased to 12.5mg  BID. I am reluctant to add hydralazine and nitrates at this time until I have a better idea what her response to increased medications are concerning BP impact. She is due to see Dr.Taylor for discussion of AICD placement with repeat ECHO demonstrating EF of 15%. She is given reading material on AICD pacemaker to review before seeing Dr.Taylor. She is reminded on low sodium diet. BMET will be drawn on Monday.

## 2011-09-27 NOTE — Progress Notes (Signed)
HPI:Karla Anderson is a morbidly obese 41 y/o patient of Dr. Diona Browner we are seeing for ongoing assessment and treatment of severe, nonischemic systolic dysfunction with most recent echo in April of 2013 demonstrating EF of 10-15%. She was recently admitted to [facility] for acute CHF where she was newly diagnosed as above. She was diuresed 25 lbs and placed on coreg 3.125 BID, lisinopril 5 mg, lasix 40 mg BID. She is here on follow-up after increasing coreg to 6.25 mg BID. BMET was drawn. She is out of the wheel chair that she was using and has become more ambulatory.    She has recently gotten married. She is feeling a little more full in her abdomen and having some PND symptoms. She denies eating salty foods or missing medications. Her wt is up 14 lbs in 3 weeks. She denies palpitations.  . No Known Allergies  Current Outpatient Prescriptions  Medication Sig Dispense Refill  . carvedilol (COREG) 6.25 MG tablet Take 1 tablet (6.25 mg total) by mouth every 12 (twelve) hours.  60 tablet  3  . cyclobenzaprine (FLEXERIL) 10 MG tablet Take 10 mg by mouth 3 (three) times daily as needed. FOR MUSCLE SPASMS      . furosemide (LASIX) 40 MG tablet Take 1 tablet (40 mg total) by mouth 2 (two) times daily.  60 tablet  0  . HYDROcodone-acetaminophen (NORCO) 5-325 MG per tablet Take 1 tablet by mouth every 6 (six) hours as needed. FOR PAIN      . lisinopril (PRINIVIL,ZESTRIL) 5 MG tablet Take 1 tablet (5 mg total) by mouth daily.  30 tablet  0  . Multiple Vitamins-Minerals (ADULT ONE DAILY GUMMIES) CHEW Chew 2 each by mouth every morning.      . potassium chloride SA (K-DUR,KLOR-CON) 20 MEQ tablet Take 2 tablets (40 mEq total) by mouth daily.  60 tablet  12  . QUEtiapine (SEROQUEL) 300 MG tablet Take 600 mg by mouth at bedtime.        . sertraline (ZOLOFT) 100 MG tablet Take 150 mg by mouth daily.       Marland Kitchen spironolactone (ALDACTONE) 25 MG tablet Take 0.5 tablets (12.5 mg total) by mouth daily.  30 tablet  12  .  traZODone (DESYREL) 150 MG tablet Take 150 mg by mouth at bedtime.        . ziprasidone (GEODON) 80 MG capsule Take 160 mg by mouth at bedtime.        Marland Kitchen zolpidem (AMBIEN) 10 MG tablet Take 10 mg by mouth at bedtime.          Past Medical History  Diagnosis Date  . Tachycardia     Rate related LBBB - seen by Dr. Graciela Husbands and Dr. Dietrich Pates in the past  . Anxiety   . Depression   . Heart murmur     No reported valvular disease  . Back pain   . Bipolar 1 disorder   . Arthritis     Bilateral knee    Past Surgical History  Procedure Date  . Cesarean section   . Carpel tunn Carpel tunnel sx  . Tubal ligation   . Tubal ligation reversal   . Cholecystectomy 11/09/2010    Procedure: LAPAROSCOPIC CHOLECYSTECTOMY;  Surgeon: Fabio Bering;  Location: [facility];  Service: General;  Laterality: N/A;  . Esophagogastroduodenoscopy 11/19/2010    Procedure: ESOPHAGOGASTRODUODENOSCOPY (EGD);  Surgeon: Malissa Hippo, MD;  [facility];  Service: Endoscopy;  Laterality: N/A;  . Laparotomy 11/19/2010  Procedure: EXPLORATORY LAPAROTOMY;  Surgeon: Fabio Bering;  Location: [facility];  Service: General;  Laterality: N/A;  . Gastrectomy 11/19/2010    Procedure: GASTRECTOMY;  Surgeon: Fabio Bering;  Location: [facility];  Service: General;  Laterality: N/A;  Partial Gastrectomy  . Gastrojejunostomy 11/19/2010    Procedure: GASTROJEJUNOSTOMY;  Surgeon: Fabio Bering;  Location: [facility];  Service: General;  Laterality: N/A;  Roux-En-Y    NAT:FTDDUK of systems complete and found to be negative unless listed above PHYSICAL EXAM BP 115/81  Pulse 91  Resp 18  Ht 5\' 4"  (1.626 m)  Wt 285 lb (129.275 kg)  BMI 48.92 kg/m2  General: Well developed, well nourished, morbidly obese in no acute distress, sitting in a wheelchair. Head: Eyes PERRLA, No xanthomas.   Normal cephalic and atramatic Lungs: Clear bilaterally to auscultation and percussion. Heart: HRRR S1 S2, distant with soft systolic murmur.   Pulses are 2+ & equal.            No carotid bruit. No JVD.  No abdominal bruits. No femoral bruits. Abdomen: Bowel sounds are positive, abdomen soft and non-tender without masses or                  Hernia's noted. Msk:  Back normal, normal gait. Normal strength and tone for age. Extremities: No clubbing, cyanosis, she has woody edema in the lower pretibial area, with 2+  edema mid calf and above..Sock line and above.   DP +1 Neuro: Alert and oriented X 3. Psych:  Good affect, responds appropriately    ASSESSMENT AND PLAN

## 2011-10-01 ENCOUNTER — Encounter (HOSPITAL_COMMUNITY): Payer: Self-pay | Admitting: Pharmacy Technician

## 2011-10-01 ENCOUNTER — Ambulatory Visit (INDEPENDENT_AMBULATORY_CARE_PROVIDER_SITE_OTHER): Payer: Medicaid Other | Admitting: Internal Medicine

## 2011-10-01 ENCOUNTER — Encounter: Payer: Self-pay | Admitting: Internal Medicine

## 2011-10-01 ENCOUNTER — Encounter: Payer: Self-pay | Admitting: *Deleted

## 2011-10-01 VITALS — BP 120/88 | HR 68 | Ht 64.0 in | Wt 269.0 lb

## 2011-10-01 DIAGNOSIS — I5021 Acute systolic (congestive) heart failure: Secondary | ICD-10-CM

## 2011-10-01 DIAGNOSIS — Z01812 Encounter for preprocedural laboratory examination: Secondary | ICD-10-CM

## 2011-10-01 LAB — BASIC METABOLIC PANEL
BUN: 40 mg/dL — ABNORMAL HIGH (ref 6–23)
Chloride: 95 mEq/L — ABNORMAL LOW (ref 96–112)
Potassium: 4 mEq/L (ref 3.5–5.3)
Sodium: 136 mEq/L (ref 135–145)

## 2011-10-01 LAB — CBC
HCT: 35.5 % — ABNORMAL LOW (ref 36.0–46.0)
Hemoglobin: 11.4 g/dL — ABNORMAL LOW (ref 12.0–15.0)
RDW: 16.4 % — ABNORMAL HIGH (ref 11.5–15.5)
WBC: 5.3 10*3/uL (ref 4.0–10.5)

## 2011-10-01 LAB — PROTIME-INR
INR: 1.12 (ref <1.50)
Prothrombin Time: 14.6 seconds (ref 11.6–15.2)

## 2011-10-01 NOTE — Patient Instructions (Addendum)
Your physician recommends that you schedule a follow-up appointment in: Dr Ladona Ridgel in October  ICD placement tomorrow.  See instruction sheet.

## 2011-10-01 NOTE — Addendum Note (Signed)
Addended by: Reather Laurence A on: 10/01/2011 09:23 AM   Modules accepted: Orders

## 2011-10-01 NOTE — Progress Notes (Signed)
HPI Karla Anderson is referred today for consideration of a BiV ICD. The patient is a very pleasant 40 yo woman with a h/o non-ischemic cardiomyopathy. She developed class 3 symptoms with dyspnea 11 months ago but did not seek medical attention. In March she developed peripheral edema and was found to have severe LV dysfunction with an EF 10%. She was placed on maximal medical therapy with lasix, Aldactone, lisinopril, and coreg and repeat 2D echo showed an EF 15%. She has continued to have severe dyspnea, class 3. 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  No Known Allergies   Current Outpatient Prescriptions  Medication Sig Dispense Refill  . carvedilol (COREG) 12.5 MG tablet Take 1 tablet (12.5 mg total) by mouth every 12 (twelve) hours.  60 tablet  3  . cyclobenzaprine (FLEXERIL) 10 MG tablet Take 10 mg by mouth 3 (three) times daily as needed. FOR MUSCLE SPASMS      . furosemide (LASIX) 40 MG tablet Take 1 tablet (40 mg total) by mouth 2 (two) times daily.  60 tablet  0  . HYDROcodone-acetaminophen (NORCO) 5-325 MG per tablet Take 1 tablet by mouth every 6 (six) hours as needed. FOR PAIN      . lisinopril (PRINIVIL,ZESTRIL) 5 MG tablet Take 1 tablet (5 mg total) by mouth daily.  30 tablet  0  . Multiple Vitamins-Minerals (ADULT ONE DAILY GUMMIES) CHEW Chew 2 each by mouth every morning.      . potassium chloride SA (K-DUR,KLOR-CON) 20 MEQ tablet Take 2 tablets (40 mEq total) by mouth daily.  60 tablet  12  . QUEtiapine (SEROQUEL) 300 MG tablet Take 600 mg by mouth at bedtime.        . sertraline (ZOLOFT) 100 MG tablet Take 150 mg by mouth daily.       . spironolactone (ALDACTONE) 25 MG tablet Take 1 tablet (25 mg total) by mouth daily.  30 tablet  3  . traZODone (DESYREL) 150 MG tablet Take 150 mg by mouth at bedtime.        . ziprasidone (GEODON) 80 MG capsule Take 160 mg by mouth at bedtime.        . zolpidem (AMBIEN) 10 MG tablet Take 10 mg by mouth at bedtime.           Past  Medical History  Diagnosis Date  . Tachycardia     Rate related LBBB - seen by Dr. Klein and Dr. Rothbart in the past  . Anxiety   . Depression   . Heart murmur     No reported valvular disease  . Back pain   . Bipolar 1 disorder   . Arthritis     Bilateral knee    ROS:   All systems reviewed and negative except as noted in the HPI.   Past Surgical History  Procedure Date  . Cesarean section   . Carpel tunn Carpel tunnel sx  . Tubal ligation   . Tubal ligation reversal   . Cholecystectomy 11/09/2010    Procedure: LAPAROSCOPIC CHOLECYSTECTOMY;  Surgeon: Brent C Ziegler;  Location: [facility];  Service: General;  Laterality: N/A;  . Esophagogastroduodenoscopy 11/19/2010    Procedure: ESOPHAGOGASTRODUODENOSCOPY (EGD);  Surgeon: Najeeb U Rehman, MD;  [facility];  Service: Endoscopy;  Laterality: N/A;  . Laparotomy 11/19/2010    Procedure: EXPLORATORY LAPAROTOMY;  Surgeon: Brent C Ziegler;  Location: [facility];  Service: General;  Laterality: N/A;  . Gastrectomy 11/19/2010    Procedure:   GASTRECTOMY;  Surgeon: Brent C Ziegler;  Location: [facility];  Service: General;  Laterality: N/A;  Partial Gastrectomy  . Gastrojejunostomy 11/19/2010    Procedure: GASTROJEJUNOSTOMY;  Surgeon: Brent C Ziegler;  Location: [facility];  Service: General;  Laterality: N/A;  Roux-En-Y     Family History  Problem Relation Age of Onset  . Stroke Father     Died recently, Age 60  . Heart failure Mother   . Diabetes Sister   . Pneumonia Father   . Coronary artery disease Father   . Parkinsonism Mother      History   Social History  . Marital Status: Divorced    Spouse Name: N/A    Number of Children: N/A  . Years of Education: N/A   Occupational History  . Not on file.   Social History Main Topics  . Smoking status: Never Smoker   . Smokeless tobacco: Not on file  . Alcohol Use: No  . Drug Use: No  . Sexually Active: Yes    Birth Control/ Protection: None, Surgical   Other Topics  Concern  . Not on file   Social History Narrative  . No narrative on file     BP 120/88  Pulse 68  Ht 5' 4" (1.626 m)  Wt 269 lb (122.018 kg)  BMI 46.17 kg/m2  Physical Exam:  Well appearing middle aged woman, NAD HEENT: Unremarkable Neck:  No JVD, no thyromegally Lungs:  Clear with no wheezes, rales, or rhonchi HEART:  Regular rate rhythm, no murmurs, no rubs, no clicks Abd:  soft, positive bowel sounds, no organomegally, no rebound, no guarding Ext:  2 plus pulses, no edema, no cyanosis, no clubbing Skin:  No rashes no nodules Neuro:  CN II through XII intact, motor grossly intact  EKG NSR with LBBB  Assess/Plan:   

## 2011-10-01 NOTE — Assessment & Plan Note (Signed)
Her symptoms remain class 3 despite maximal medical therapy. EF 15%. She has LBBB with a QRS duration of 160 ms. I have discussed the treatment options with the patient and her husband. She is a candidate for BiV ICD implant.  The risk/benefits/goals/expectations of the procedure have been discussed and they wish to proceed.

## 2011-10-02 ENCOUNTER — Other Ambulatory Visit: Payer: Self-pay | Admitting: *Deleted

## 2011-10-02 ENCOUNTER — Encounter (HOSPITAL_COMMUNITY)
Admission: RE | Disposition: A | Discharge status: Home / Self Care | Payer: Self-pay | Source: Ambulatory Visit | Attending: Internal Medicine

## 2011-10-02 ENCOUNTER — Ambulatory Visit (HOSPITAL_COMMUNITY)
Admission: RE | Admit: 2011-10-02 | Discharge: 2011-10-03 | Disposition: A | Discharge status: Home / Self Care | Payer: Medicaid Other | Source: Ambulatory Visit | Attending: Internal Medicine | Admitting: Internal Medicine

## 2011-10-02 ENCOUNTER — Encounter (HOSPITAL_COMMUNITY): Payer: Self-pay | Admitting: General Practice

## 2011-10-02 ENCOUNTER — Ambulatory Visit (HOSPITAL_COMMUNITY): Payer: Medicaid Other

## 2011-10-02 DIAGNOSIS — Z95810 Presence of automatic (implantable) cardiac defibrillator: Secondary | ICD-10-CM | POA: Insufficient documentation

## 2011-10-02 DIAGNOSIS — I509 Heart failure, unspecified: Secondary | ICD-10-CM

## 2011-10-02 DIAGNOSIS — I428 Other cardiomyopathies: Secondary | ICD-10-CM

## 2011-10-02 DIAGNOSIS — I447 Left bundle-branch block, unspecified: Secondary | ICD-10-CM | POA: Insufficient documentation

## 2011-10-02 HISTORY — DX: Essential (primary) hypertension: I10

## 2011-10-02 HISTORY — DX: Presence of automatic (implantable) cardiac defibrillator: Z95.810

## 2011-10-02 HISTORY — PX: CARDIAC DEFIBRILLATOR PLACEMENT: SHX171

## 2011-10-02 HISTORY — DX: Personal history of other medical treatment: Z92.89

## 2011-10-02 HISTORY — PX: BI-VENTRICULAR IMPLANTABLE CARDIOVERTER DEFIBRILLATOR: SHX5459

## 2011-10-02 LAB — SURGICAL PCR SCREEN: MRSA, PCR: NEGATIVE

## 2011-10-02 SURGERY — BI-VENTRICULAR IMPLANTABLE CARDIOVERTER DEFIBRILLATOR  (CRT-D)
Anesthesia: LOCAL

## 2011-10-02 MED ORDER — LIDOCAINE HCL (PF) 1 % IJ SOLN
INTRAMUSCULAR | Status: AC
  Filled 2011-10-02: qty 60

## 2011-10-02 MED ORDER — YOU HAVE A PACEMAKER BOOK
Freq: Once | Status: AC
  Administered 2011-10-03: 07:00:00
  Filled 2011-10-02: qty 1

## 2011-10-02 MED ORDER — MUPIROCIN 2 % EX OINT
TOPICAL_OINTMENT | Freq: Two times a day (BID) | CUTANEOUS | Status: DC
  Administered 2011-10-02: 22:00:00 via NASAL

## 2011-10-02 MED ORDER — MIDAZOLAM HCL 5 MG/5ML IJ SOLN
INTRAMUSCULAR | Status: AC
  Filled 2011-10-02: qty 5

## 2011-10-02 MED ORDER — ONDANSETRON HCL 4 MG/2ML IJ SOLN: 4.0000 mg | Freq: Four times a day (QID) | INTRAMUSCULAR | Status: DC | PRN

## 2011-10-02 MED ORDER — SODIUM CHLORIDE 0.9 % IJ SOLN: 3.0000 mL | INTRAMUSCULAR | Status: DC | PRN

## 2011-10-02 MED ORDER — POTASSIUM CHLORIDE CRYS ER 20 MEQ PO TBCR
40.0000 meq | EXTENDED_RELEASE_TABLET | Freq: Every day | ORAL | Status: DC
  Administered 2011-10-02 – 2011-10-03 (×2): 40 meq via ORAL
  Filled 2011-10-02 (×2): qty 2

## 2011-10-02 MED ORDER — MUPIROCIN 2 % EX OINT
TOPICAL_OINTMENT | CUTANEOUS | Status: AC
  Administered 2011-10-02: 1
  Filled 2011-10-02: qty 22

## 2011-10-02 MED ORDER — QUETIAPINE FUMARATE 300 MG PO TABS: 600.0000 mg | ORAL_TABLET | Freq: Every day | ORAL | Status: DC

## 2011-10-02 MED ORDER — CARVEDILOL 12.5 MG PO TABS
12.5000 mg | ORAL_TABLET | Freq: Two times a day (BID) | ORAL | Status: DC
  Administered 2011-10-02 – 2011-10-03 (×2): 12.5 mg via ORAL
  Filled 2011-10-02 (×3): qty 1

## 2011-10-02 MED ORDER — CEFAZOLIN SODIUM-DEXTROSE 2-3 GM-% IV SOLR
2.0000 g | Freq: Four times a day (QID) | INTRAVENOUS | Status: AC
  Administered 2011-10-02 – 2011-10-03 (×3): 2 g via INTRAVENOUS
  Filled 2011-10-02 (×3): qty 50

## 2011-10-02 MED ORDER — LISINOPRIL 5 MG PO TABS
5.0000 mg | ORAL_TABLET | Freq: Every day | ORAL | Status: DC
  Administered 2011-10-02 – 2011-10-03 (×2): 5 mg via ORAL
  Filled 2011-10-02 (×2): qty 1

## 2011-10-02 MED ORDER — ACETAMINOPHEN 325 MG PO TABS: 325.0000 mg | ORAL_TABLET | ORAL | Status: DC | PRN

## 2011-10-02 MED ORDER — FUROSEMIDE 40 MG PO TABS
40.0000 mg | ORAL_TABLET | Freq: Two times a day (BID) | ORAL | Status: DC
  Administered 2011-10-02 – 2011-10-03 (×2): 40 mg via ORAL
  Filled 2011-10-02 (×4): qty 1

## 2011-10-02 MED ORDER — SERTRALINE HCL 50 MG PO TABS
150.0000 mg | ORAL_TABLET | Freq: Every day | ORAL | Status: DC
  Administered 2011-10-02 – 2011-10-03 (×2): 150 mg via ORAL
  Filled 2011-10-02 (×2): qty 1

## 2011-10-02 MED ORDER — DEXTROSE 5 % IV SOLN
3.0000 g | INTRAVENOUS | Status: DC
  Filled 2011-10-02: qty 30

## 2011-10-02 MED ORDER — FENTANYL CITRATE 0.05 MG/ML IJ SOLN
INTRAMUSCULAR | Status: AC
  Filled 2011-10-02: qty 2

## 2011-10-02 MED ORDER — SODIUM CHLORIDE 0.9 % IR SOLN
80.0000 mg | Status: DC
  Filled 2011-10-02: qty 2

## 2011-10-02 MED ORDER — ZIPRASIDONE HCL 80 MG PO CAPS
160.0000 mg | ORAL_CAPSULE | Freq: Every day | ORAL | Status: DC
  Administered 2011-10-02: 160 mg via ORAL
  Filled 2011-10-02 (×2): qty 2

## 2011-10-02 MED ORDER — ZOLPIDEM TARTRATE 5 MG PO TABS
10.0000 mg | ORAL_TABLET | Freq: Every day | ORAL | Status: DC
  Administered 2011-10-02: 10 mg via ORAL
  Filled 2011-10-02: qty 2

## 2011-10-02 MED ORDER — CHLORHEXIDINE GLUCONATE 4 % EX LIQD: 60.0000 mL | Freq: Once | CUTANEOUS | Status: DC

## 2011-10-02 MED ORDER — SODIUM CHLORIDE 0.9 % IV SOLN
250.0000 mL | INTRAVENOUS | Status: DC
  Administered 2011-10-02: 1 mL via INTRAVENOUS

## 2011-10-02 MED ORDER — TRAZODONE HCL 150 MG PO TABS
150.0000 mg | ORAL_TABLET | Freq: Every day | ORAL | Status: DC
  Administered 2011-10-02: 150 mg via ORAL
  Filled 2011-10-02 (×2): qty 1

## 2011-10-02 MED ORDER — HYDROCODONE-ACETAMINOPHEN 5-325 MG PO TABS
1.0000 | ORAL_TABLET | Freq: Four times a day (QID) | ORAL | Status: DC | PRN
  Administered 2011-10-02 (×2): 1 via ORAL
  Filled 2011-10-02 (×2): qty 1

## 2011-10-02 MED ORDER — SODIUM CHLORIDE 0.45 % IV SOLN
INTRAVENOUS | Status: DC
  Administered 2011-10-02: 50 mL/h via INTRAVENOUS

## 2011-10-02 MED ORDER — CYCLOBENZAPRINE HCL 10 MG PO TABS
10.0000 mg | ORAL_TABLET | Freq: Three times a day (TID) | ORAL | Status: DC | PRN
  Filled 2011-10-02: qty 1

## 2011-10-02 MED ORDER — SODIUM CHLORIDE 0.9 % IJ SOLN: 3.0000 mL | Freq: Two times a day (BID) | INTRAMUSCULAR | Status: DC

## 2011-10-02 MED ORDER — HEPARIN (PORCINE) IN NACL 2-0.9 UNIT/ML-% IJ SOLN
INTRAMUSCULAR | Status: AC
  Filled 2011-10-02: qty 1000

## 2011-10-02 MED ORDER — SPIRONOLACTONE 25 MG PO TABS
25.0000 mg | ORAL_TABLET | Freq: Every day | ORAL | Status: DC
  Administered 2011-10-02 – 2011-10-03 (×2): 25 mg via ORAL
  Filled 2011-10-02 (×2): qty 1

## 2011-10-02 NOTE — Progress Notes (Signed)
Pharmacy note: seroquel and geodon  Patient noted on seroquel and geodon at home.  Patient confirms she takes both of these at home but this is very atypical. She follows with Dr. Sherlyn Lick [facility] office) but no contact information could obtained for this location.   Due to risk of adverse effects from duplicate medication will hold seroquel and continue geodon for now  Please call pharmacy if you would prefer to continue both of these medications.  Thank you, Harland German, Pharm D 10/02/2011 3:16 PM

## 2011-10-02 NOTE — Op Note (Signed)
BiV ICD implant via the left subclavian vein without immediate complication. Z#[id number].

## 2011-10-02 NOTE — Interval H&P Note (Signed)
History and Physical Interval Note:  10/02/2011 11:07 AM  Karla Anderson  has presented today for surgery, with the diagnosis of chf  The various methods of treatment have been discussed with the patient and family. After consideration of risks, benefits and other options for treatment, the patient has consented to  Procedure(s) (LRB): BI-VENTRICULAR IMPLANTABLE CARDIOVERTER DEFIBRILLATOR  (CRT-D) (N/A) as a surgical intervention .  The patient's history has been reviewed, patient examined, no change in status, stable for surgery.  I have reviewed the patients' chart and labs.  Questions were answered to the patient's satisfaction.     Lewayne Bunting

## 2011-10-02 NOTE — H&P (View-Only) (Signed)
HPI Karla Anderson is referred today for consideration of a BiV ICD. The patient is a very pleasant 41 yo woman with a h/o non-ischemic cardiomyopathy. She developed class 3 symptoms with dyspnea 11 months ago but did not seek medical attention. In March she developed peripheral edema and was found to have severe LV dysfunction with an EF 10%. She was placed on maximal medical therapy with lasix, Aldactone, lisinopril, and coreg and repeat 2D echo showed an EF 15%. She has continued to have severe dyspnea, class 3. 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  No Known Allergies   Current Outpatient Prescriptions  Medication Sig Dispense Refill  . carvedilol (COREG) 12.5 MG tablet Take 1 tablet (12.5 mg total) by mouth every 12 (twelve) hours.  60 tablet  3  . cyclobenzaprine (FLEXERIL) 10 MG tablet Take 10 mg by mouth 3 (three) times daily as needed. FOR MUSCLE SPASMS      . furosemide (LASIX) 40 MG tablet Take 1 tablet (40 mg total) by mouth 2 (two) times daily.  60 tablet  0  . HYDROcodone-acetaminophen (NORCO) 5-325 MG per tablet Take 1 tablet by mouth every 6 (six) hours as needed. FOR PAIN      . lisinopril (PRINIVIL,ZESTRIL) 5 MG tablet Take 1 tablet (5 mg total) by mouth daily.  30 tablet  0  . Multiple Vitamins-Minerals (ADULT ONE DAILY GUMMIES) CHEW Chew 2 each by mouth every morning.      . potassium chloride SA (K-DUR,KLOR-CON) 20 MEQ tablet Take 2 tablets (40 mEq total) by mouth daily.  60 tablet  12  . QUEtiapine (SEROQUEL) 300 MG tablet Take 600 mg by mouth at bedtime.        . sertraline (ZOLOFT) 100 MG tablet Take 150 mg by mouth daily.       Marland Kitchen spironolactone (ALDACTONE) 25 MG tablet Take 1 tablet (25 mg total) by mouth daily.  30 tablet  3  . traZODone (DESYREL) 150 MG tablet Take 150 mg by mouth at bedtime.        . ziprasidone (GEODON) 80 MG capsule Take 160 mg by mouth at bedtime.        Marland Kitchen zolpidem (AMBIEN) 10 MG tablet Take 10 mg by mouth at bedtime.           Past  Medical History  Diagnosis Date  . Tachycardia     Rate related LBBB - seen by Dr. Graciela Husbands and Dr. Dietrich Pates in the past  . Anxiety   . Depression   . Heart murmur     No reported valvular disease  . Back pain   . Bipolar 1 disorder   . Arthritis     Bilateral knee    ROS:   All systems reviewed and negative except as noted in the HPI.   Past Surgical History  Procedure Date  . Cesarean section   . Carpel tunn Carpel tunnel sx  . Tubal ligation   . Tubal ligation reversal   . Cholecystectomy 11/09/2010    Procedure: LAPAROSCOPIC CHOLECYSTECTOMY;  Surgeon: Fabio Bering;  Location: [facility];  Service: General;  Laterality: N/A;  . Esophagogastroduodenoscopy 11/19/2010    Procedure: ESOPHAGOGASTRODUODENOSCOPY (EGD);  Surgeon: Malissa Hippo, MD;  [facility];  Service: Endoscopy;  Laterality: N/A;  . Laparotomy 11/19/2010    Procedure: EXPLORATORY LAPAROTOMY;  Surgeon: Fabio Bering;  Location: [facility];  Service: General;  Laterality: N/A;  . Gastrectomy 11/19/2010    Procedure:  GASTRECTOMY;  Surgeon: Fabio Bering;  Location: [facility];  Service: General;  Laterality: N/A;  Partial Gastrectomy  . Gastrojejunostomy 11/19/2010    Procedure: GASTROJEJUNOSTOMY;  Surgeon: Fabio Bering;  Location: [facility];  Service: General;  Laterality: N/A;  Roux-En-Y     Family History  Problem Relation Age of Onset  . Stroke Father     Died recently, Age 23  . Heart failure Mother   . Diabetes Sister   . Pneumonia Father   . Coronary artery disease Father   . Parkinsonism Mother      History   Social History  . Marital Status: Divorced    Spouse Name: N/A    Number of Children: N/A  . Years of Education: N/A   Occupational History  . Not on file.   Social History Main Topics  . Smoking status: Never Smoker   . Smokeless tobacco: Not on file  . Alcohol Use: No  . Drug Use: No  . Sexually Active: Yes    Birth Control/ Protection: None, Surgical   Other Topics  Concern  . Not on file   Social History Narrative  . No narrative on file     BP 120/88  Pulse 68  Ht 5\' 4"  (1.626 m)  Wt 269 lb (122.018 kg)  BMI 46.17 kg/m2  Physical Exam:  Well appearing middle aged woman, NAD HEENT: Unremarkable Neck:  No JVD, no thyromegally Lungs:  Clear with no wheezes, rales, or rhonchi HEART:  Regular rate rhythm, no murmurs, no rubs, no clicks Abd:  soft, positive bowel sounds, no organomegally, no rebound, no guarding Ext:  2 plus pulses, no edema, no cyanosis, no clubbing Skin:  No rashes no nodules Neuro:  CN II through XII intact, motor grossly intact  EKG NSR with LBBB  Assess/Plan:

## 2011-10-03 ENCOUNTER — Ambulatory Visit (HOSPITAL_COMMUNITY): Payer: Medicaid Other

## 2011-10-03 NOTE — Op Note (Signed)
NAMETEGHAN, PHILBIN NO.:  [id number]  MEDICAL RECORD NO.:  [id number]  LOCATION:  2504                         FACILITY:  [facility]  PHYSICIAN:  Doylene Canning. Ladona Ridgel, MD    DATE OF BIRTH:  [birth year]  DATE OF PROCEDURE:  10/02/2011 DATE OF DISCHARGE:                              OPERATIVE REPORT   PROCEDURE PERFORMED:  Insertion of a biventricular ICD.  INDICATION:  Longstanding nonischemic cardiomyopathy, severe left ventricular dysfunction despite maximal medical therapy, ejection fraction 15% with left bundle-branch block and QRS duration of 168 milliseconds.  INTRODUCTION:  The patient is a 41 year old woman who has had nearly a year of heart failure symptoms.  She was treated beginning back in March and has been on maximal medical therapy with ACE inhibitors, beta- blockers, diuretics, and repeat echo demonstrated an EF of 15%.  She has left bundle-branch block and class III heart failure and is now referred for BiV ICD insertion.  PROCEDURE:  After informed consent was obtained, the patient was taken to the Diagnostic EP Lab in the fasting state.  After usual preparation and draping, intravenous fentanyl and midazolam was given for sedation. A 30 mL of lidocaine was infiltrated into the left infraclavicular region.  An 8-cm incision was carried out over this region, and electrocautery was utilized to dissect down to the fascial plane.  Left subclavian vein was punctured x3 and the [employer] Gore-Tex defibrillation lead, serial number [id number] was advanced into the right ventricle.  The [employer] Dextrus model [phone], active fixation pacing lead, serial number [id number] was advanced into the right atrium.  Mapping was carried out in the right ventricle and at the final site, the R-waves measured 10 millivolts and the pacing impedance was 800 ohms and the threshold was 0.8 volts at 0.5 milliseconds.  A 10-volt pacing did not  stimulate the diaphragm and large injury current with active fixation of the lead.  With the ventricular lead in satisfactory position, attention was then turned to placement of the atrial lead, was placed in the anterolateral portion of the right atrium where P-waves measured 4 millivolts.  The pacing impedance was 500 ohms and the threshold was 0.5 volts at 0.5 milliseconds.  Again, 10-volt pacing did not stimulate the diaphragm. With both the atrial and the ventricular lead in satisfactory position, attention was turned to placement of the LV lead.  The [employer] guiding catheter along with a 6-French hexapolar EP catheter were advanced into the right atrium.  The coronary sinus was cannulated without particular difficulty.  Venography of the coronary sinus was carried out.  In general, her veins were small.  A lateral vein was identified.  It was cannulated with the [employer] 0.014 guidewire and the [facility] [employer] ACUITY steerable bipolar pacing lead, serial number [id number] was advanced into the lateral vein.  It was able to be advanced just far enough in to maintain what appeared to be good stability.  With these satisfactory parameters, the leads were secured, the guiding catheter was liberated from the LV lead in the usual manner and the leads were secured to the subpectoral fascia with a figure-of-eight silk suture.  The sewing sleeve was secured  with silk suture.  Electrocautery was utilized to make a subcutaneous pocket.  Antibiotic irrigation was utilized to irrigate the pocket, and electrocautery was utilized to assure hemostasis.  The [facility] CRT-D BiV ICD, serial number [id number] was connected to the atrial, RV and LV leads and placed back in the subcutaneous pocket where the generator was secured with silk suture.  At this point, the pocket was again irrigated with antibiotic irrigation and the incision closed with 2-0 and 3-0 Vicryl. Benzoin and  Steri-Strips were painted on the incision and the patient was more deeply sedated for defibrillation threshold testing.  At this point, I scrubbed out of the case to supervise deep sedation. VF was initially induced with a T-wave shock.  A 17-joule followed by 26- joule shock were delivered, which failed to terminate VF and rescue external shock was delivered.  Six minutes was allowed to elapse. Polarities were reversed and defibrillation threshold testing was again carried out.  VF was again induced with a T-wave shock and a 26-joule shock was at this time delivered, which successfully terminated the ventricular fibrillation and restored sinus rhythm utilizing the reverse polarity.  With a satisfactory margin of safety, the patient was allowed to awaken and returned to her room in satisfactory condition.  COMPLICATIONS:  There were no immediate procedure complications.  RESULTS:  This demonstrate successful implantation of a [employer] biventricular ICD in a patient with a nonischemic cardiomyopathy and chronic systolic heart failure, left bundle-branch block, ejection fraction 15%.     Doylene Canning. Ladona Ridgel, MD     GWT/MEDQ  D:  10/02/2011  T:  10/03/2011  Job:  [id number]  cc:   Gerrit Friends. Dietrich Pates, MD, [facility]

## 2011-10-03 NOTE — Discharge Summary (Signed)
ELECTROPHYSIOLOGY DISCHARGE SUMMARY    Patient ID: Karla Anderson,  MRN: [id number], DOB/AGE: [birth year] 41 y.o.  Admit date: 10/02/2011 Discharge date: 10/03/2011  Primary Care Physician: Avon Gully, MD   Primary Cardiologist: Diona Browner, MD  Primary Discharge Diagnosis:  1. Nonischemic cardiomyopathy s/p BiV ICD implantation 2. Chronic systolic CHF  Secondary Discharge Diagnoses:  1. Bipolar disorder 2. Anxiety/depression 3. Osteoarthritis  Procedures This Admission:  1. BiV ICD implantation 10/02/2011 [employer] Gore-Tex defibrillation lead, serial number [id number] was advanced into the right ventricle. The [employer] Dextrus model [phone], active fixation pacing lead, serial number [id number] was advanced into the right atrium. [employer] ACUITY steerable bipolar pacing lead, serial number  [id number] was advanced into the lateral vein. [employer] [facility] CRT-D BiV ICD, serial  number [id number].  History and Hospital Course:  Karla Anderson is a pleasant 41 yo woman with a non-ischemic cardiomyopathy. She developed class 3 symptoms with dyspnea 11 months ago but did not seek medical attention. In March she developed peripheral edema and was found to have severe LV dysfunction with an EF 10%. She was placed on maximal medical therapy with lasix, Aldactone, lisinopril, and coreg and repeat 2D echo showed an EF 15%. She has continued to have severe dyspnea, class 3. 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 She was referred to Dr. Ladona Ridgel for consideration of BiV ICD. She underwent BiV ICD implantation yesterday 10/02/2011. She tolerated this procedure well and there were no complications. She was observed overnight and has no complaints this AM. She is eager to go home. She remains hemodynamically stable and afebrile. Her device interrogation shows normal BiV ICD function with stable lead measurements. Her chest x-ray shows stable lead placement  without pneumothorax. Her implant site is intact without significant bleeding or hematoma. She has been seen, examined and deemed stable for discharge today by Dr. Ladona Ridgel. She has been given wound care and follow-up instructions. Her home medications have been continued as per home dosing. We will repeat BMET at her wound check follow-up. She will follow-up with her PCP/psych for management of her psychotropic medications.  Discharge Vitals: Blood pressure 120/84, pulse 82, temperature 97.9 F (36.6 C), temperature source Oral, resp. rate 18, height 5\' 4"  (1.626 m), weight 270 lb 4.5 oz (122.6 kg), last menstrual period 01/30/2010, SpO2 97.00%.   Labs: Lab Results  Component Value Date   WBC 5.3 10/01/2011   HGB 11.4* 10/01/2011   HCT 35.5* 10/01/2011   MCV 89.6 10/01/2011   PLT 218 10/01/2011     Lab 10/01/11 0917  NA 136  K 4.0  CL 95*  CO2 31  BUN 40*  CREATININE 1.86*  CALCIUM 9.5  PROT --  BILITOT --  ALKPHOS --  ALT --  AST --  GLUCOSE 93    Disposition:  The patient is being discharged in stable condition.  Follow-up: Follow-up Information    Follow up with [facility] in 10 days. (For wound check; Our office will call to let you know your appointment date and time)    Contact information:   [address]  [address] [place] [address] [phone]    Follow up with Lewayne Bunting, MD in 3 months. (Our office will call to let you know your appointment date and time)    Contact information:   [address]  [address] [place] [address] [phone]   Discharge Medications:  Medication  List  As of 10/03/2011  9:01 AM   TAKE these medications         ADULT ONE DAILY GUMMIES Chew   Chew 2 each by mouth every morning.      carvedilol 12.5 MG tablet   Commonly known as: COREG   Take 1 tablet (12.5 mg total) by mouth every 12 (twelve) hours.      cyclobenzaprine 10 MG tablet   Commonly known as: FLEXERIL   Take 10 mg by  mouth 3 (three) times daily as needed. For muscle spasms.      furosemide 40 MG tablet   Commonly known as: LASIX   Take 1 tablet (40 mg total) by mouth 2 (two) times daily.      HYDROcodone-acetaminophen 5-325 MG per tablet   Commonly known as: NORCO   Take 1 tablet by mouth every 6 (six) hours as needed. For pain.      lisinopril 5 MG tablet   Commonly known as: PRINIVIL,ZESTRIL   Take 1 tablet (5 mg total) by mouth daily.      potassium chloride SA 20 MEQ tablet   Commonly known as: K-DUR,KLOR-CON   Take 2 tablets (40 mEq total) by mouth daily.      QUEtiapine 300 MG tablet   Commonly known as: SEROQUEL   Take 600 mg by mouth at bedtime.      sertraline 100 MG tablet   Commonly known as: ZOLOFT   Take 150 mg by mouth daily.      spironolactone 25 MG tablet   Commonly known as: ALDACTONE   Take 1 tablet (25 mg total) by mouth daily.      traZODone 150 MG tablet   Commonly known as: DESYREL   Take 150 mg by mouth at bedtime.      ziprasidone 80 MG capsule   Commonly known as: GEODON   Take 160 mg by mouth at bedtime.      zolpidem 10 MG tablet   Commonly known as: AMBIEN   Take 10 mg by mouth at bedtime.          Duration of Discharge Encounter: Greater than 30 minutes including physician time.  Signed, Rick Duff, PA-C 10/03/2011, 9:01 AM  EP attending  Doing well this morning. LV lead and ICD working normally. Will discharge with the usual followup. Continue preop meds.  Lewayne Bunting, M.D.

## 2011-10-16 ENCOUNTER — Telehealth: Payer: Self-pay | Admitting: Internal Medicine

## 2011-10-16 NOTE — Telephone Encounter (Signed)
Since having device placed about 2 weeks ago, has feeling that she may pass out, but she hasn't, pls advise

## 2011-10-17 NOTE — Telephone Encounter (Signed)
For the last few days she has felt like she is going to pass out.  She feels like she is going to pass out, she gets swimmy headed when she gets up out of bed.  The last 3 days her heart was fluttering but today it is better.  She is scheduled to have her wound check tomorrow and we will interrogate and see what is going on.  She is going to lay low today and make sure she stays hydrated.   She can go to the ER if she passes out or receives a shock and does not feel well

## 2011-10-18 ENCOUNTER — Encounter: Payer: Self-pay | Admitting: Internal Medicine

## 2011-10-18 ENCOUNTER — Ambulatory Visit (INDEPENDENT_AMBULATORY_CARE_PROVIDER_SITE_OTHER): Payer: Medicaid Other | Admitting: *Deleted

## 2011-10-18 DIAGNOSIS — I429 Cardiomyopathy, unspecified: Secondary | ICD-10-CM

## 2011-10-18 LAB — ICD DEVICE OBSERVATION
AL IMPEDENCE ICD: 682 Ohm
AL THRESHOLD: 1.1 V
DEV-0020ICD: NEGATIVE
HV IMPEDENCE: 76 Ohm
LV LEAD THRESHOLD: 0.7 V
RV LEAD IMPEDENCE ICD: 533 Ohm
RV LEAD THRESHOLD: 0.5 V
TOT-0001: 2
VF: 2

## 2011-10-18 NOTE — Progress Notes (Signed)
Wound check-ICD 

## 2011-11-01 ENCOUNTER — Telehealth: Payer: Self-pay | Admitting: Adult Health

## 2011-11-01 NOTE — Telephone Encounter (Signed)
Patient states she passed out on Wed night, 911 was called and she refused to go to hospital stating she was fine.  She states she passed out today.  I spoke with RN and gave patient message to go to ER and to take pacer card with her.  Patient understood.

## 2011-11-01 NOTE — Telephone Encounter (Signed)
Advised patient to seek medical attention in the ER immediately and take ICD card with her, as symptoms may be due to arrhythmia and may need interrogated.

## 2011-11-08 ENCOUNTER — Inpatient Hospital Stay (HOSPITAL_COMMUNITY)
Admission: EM | Admit: 2011-11-08 | Discharge: 2011-11-11 | DRG: 683 | Disposition: A | Discharge status: Home / Self Care | Payer: Medicaid Other | Attending: Internal Medicine | Admitting: Internal Medicine

## 2011-11-08 ENCOUNTER — Encounter (HOSPITAL_COMMUNITY): Payer: Self-pay | Admitting: *Deleted

## 2011-11-08 DIAGNOSIS — R55 Syncope and collapse: Secondary | ICD-10-CM

## 2011-11-08 DIAGNOSIS — I429 Cardiomyopathy, unspecified: Secondary | ICD-10-CM | POA: Diagnosis present

## 2011-11-08 DIAGNOSIS — N179 Acute kidney failure, unspecified: Secondary | ICD-10-CM | POA: Diagnosis present

## 2011-11-08 DIAGNOSIS — I509 Heart failure, unspecified: Secondary | ICD-10-CM | POA: Diagnosis present

## 2011-11-08 DIAGNOSIS — F411 Generalized anxiety disorder: Secondary | ICD-10-CM | POA: Diagnosis present

## 2011-11-08 DIAGNOSIS — Z9089 Acquired absence of other organs: Secondary | ICD-10-CM

## 2011-11-08 DIAGNOSIS — Z903 Acquired absence of stomach [part of]: Secondary | ICD-10-CM

## 2011-11-08 DIAGNOSIS — I447 Left bundle-branch block, unspecified: Secondary | ICD-10-CM | POA: Diagnosis present

## 2011-11-08 DIAGNOSIS — F3130 Bipolar disorder, current episode depressed, mild or moderate severity, unspecified: Secondary | ICD-10-CM | POA: Diagnosis present

## 2011-11-08 DIAGNOSIS — F319 Bipolar disorder, unspecified: Secondary | ICD-10-CM | POA: Diagnosis present

## 2011-11-08 DIAGNOSIS — M1710 Unilateral primary osteoarthritis, unspecified knee: Secondary | ICD-10-CM | POA: Diagnosis present

## 2011-11-08 DIAGNOSIS — I959 Hypotension, unspecified: Secondary | ICD-10-CM | POA: Diagnosis present

## 2011-11-08 DIAGNOSIS — Z9884 Bariatric surgery status: Secondary | ICD-10-CM

## 2011-11-08 DIAGNOSIS — Z8249 Family history of ischemic heart disease and other diseases of the circulatory system: Secondary | ICD-10-CM

## 2011-11-08 DIAGNOSIS — R011 Cardiac murmur, unspecified: Secondary | ICD-10-CM | POA: Diagnosis present

## 2011-11-08 DIAGNOSIS — R000 Tachycardia, unspecified: Secondary | ICD-10-CM | POA: Diagnosis present

## 2011-11-08 DIAGNOSIS — Z95810 Presence of automatic (implantable) cardiac defibrillator: Secondary | ICD-10-CM

## 2011-11-08 DIAGNOSIS — D649 Anemia, unspecified: Secondary | ICD-10-CM | POA: Diagnosis present

## 2011-11-08 DIAGNOSIS — Z6841 Body Mass Index (BMI) 40.0 and over, adult: Secondary | ICD-10-CM

## 2011-11-08 LAB — BASIC METABOLIC PANEL
BUN: 29 mg/dL — ABNORMAL HIGH (ref 6–23)
CO2: 26 mEq/L (ref 19–32)
Chloride: 102 mEq/L (ref 96–112)
Creatinine, Ser: 1.91 mg/dL — ABNORMAL HIGH (ref 0.50–1.10)
GFR calc Af Amer: 37 mL/min — ABNORMAL LOW (ref >90)
Potassium: 4.4 mEq/L (ref 3.5–5.1)

## 2011-11-08 LAB — CARDIAC PANEL(CRET KIN+CKTOT+MB+TROPI)
CK, MB: 1.5 ng/mL (ref 0.3–4.0)
Relative Index: INVALID (ref 0.0–2.5)
Total CK: 54 U/L (ref 7–177)
Troponin I: <0.3 ng/mL (ref <0.30)

## 2011-11-08 LAB — CBC
MCHC: 33.7 g/dL (ref 30.0–36.0)
Platelets: 164 10*3/uL (ref 150–400)
RDW: 14.2 % (ref 11.5–15.5)
WBC: 5.4 10*3/uL (ref 4.0–10.5)

## 2011-11-08 LAB — CBC WITH DIFFERENTIAL/PLATELET
Basophils Absolute: 0 10*3/uL (ref 0.0–0.1)
Basophils Relative: 1 % (ref 0–1)
HCT: 30.6 % — ABNORMAL LOW (ref 36.0–46.0)
Hemoglobin: 10.5 g/dL — ABNORMAL LOW (ref 12.0–15.0)
Lymphocytes Relative: 28 % (ref 12–46)
MCHC: 34.3 g/dL (ref 30.0–36.0)
Monocytes Absolute: 0.7 10*3/uL (ref 0.1–1.0)
Monocytes Relative: 10 % (ref 3–12)
Neutro Abs: 3.7 10*3/uL (ref 1.7–7.7)
Neutrophils Relative %: 59 % (ref 43–77)
RBC: 3.53 MIL/uL — ABNORMAL LOW (ref 3.87–5.11)
WBC: 6.3 10*3/uL (ref 4.0–10.5)

## 2011-11-08 LAB — URINALYSIS, ROUTINE W REFLEX MICROSCOPIC
Glucose, UA: NEGATIVE mg/dL
Ketones, ur: NEGATIVE mg/dL
Leukocytes, UA: NEGATIVE
Nitrite: NEGATIVE
Specific Gravity, Urine: 1.02 (ref 1.005–1.030)
pH: 6 (ref 5.0–8.0)

## 2011-11-08 LAB — TROPONIN I: Troponin I: <0.3 ng/mL (ref <0.30)

## 2011-11-08 LAB — MRSA PCR SCREENING: MRSA by PCR: NEGATIVE

## 2011-11-08 MED ORDER — SODIUM CHLORIDE 0.9 % IV SOLN
INTRAVENOUS | Status: DC
  Administered 2011-11-08: 17:00:00 via INTRAVENOUS

## 2011-11-08 MED ORDER — ZIPRASIDONE HCL 80 MG PO CAPS
ORAL_CAPSULE | ORAL | Status: AC
  Filled 2011-11-08: qty 2

## 2011-11-08 MED ORDER — SODIUM CHLORIDE 0.9 % IV SOLN: INTRAVENOUS | Status: DC

## 2011-11-08 MED ORDER — ENOXAPARIN SODIUM 60 MG/0.6ML ~~LOC~~ SOLN
60.0000 mg | SUBCUTANEOUS | Status: DC
  Administered 2011-11-08 – 2011-11-10 (×3): 60 mg via SUBCUTANEOUS
  Filled 2011-11-08 (×3): qty 0.6

## 2011-11-08 MED ORDER — ZIPRASIDONE HCL 80 MG PO CAPS
160.0000 mg | ORAL_CAPSULE | Freq: Every day | ORAL | Status: DC
  Administered 2011-11-08 – 2011-11-10 (×3): 160 mg via ORAL
  Filled 2011-11-08 (×5): qty 2

## 2011-11-08 MED ORDER — CARVEDILOL 12.5 MG PO TABS
12.5000 mg | ORAL_TABLET | Freq: Two times a day (BID) | ORAL | Status: DC
  Administered 2011-11-08 – 2011-11-10 (×3): 12.5 mg via ORAL
  Filled 2011-11-08 (×5): qty 1

## 2011-11-08 MED ORDER — HYDROCODONE-ACETAMINOPHEN 5-325 MG PO TABS
1.0000 | ORAL_TABLET | Freq: Four times a day (QID) | ORAL | Status: DC | PRN
  Administered 2011-11-09 – 2011-11-10 (×3): 1 via ORAL
  Filled 2011-11-08 (×4): qty 1

## 2011-11-08 MED ORDER — ZOLPIDEM TARTRATE 5 MG PO TABS
10.0000 mg | ORAL_TABLET | Freq: Every day | ORAL | Status: DC
  Administered 2011-11-08 – 2011-11-10 (×3): 10 mg via ORAL
  Filled 2011-11-08 (×3): qty 2

## 2011-11-08 MED ORDER — QUETIAPINE FUMARATE 100 MG PO TABS
600.0000 mg | ORAL_TABLET | Freq: Every day | ORAL | Status: DC
  Administered 2011-11-08 – 2011-11-10 (×3): 600 mg via ORAL
  Filled 2011-11-08 (×3): qty 6

## 2011-11-08 MED ORDER — SODIUM CHLORIDE 0.9 % IV SOLN
INTRAVENOUS | Status: DC
  Administered 2011-11-09 – 2011-11-10 (×4): via INTRAVENOUS

## 2011-11-08 MED ORDER — SODIUM CHLORIDE 0.9 % IJ SOLN
3.0000 mL | Freq: Two times a day (BID) | INTRAMUSCULAR | Status: DC
  Administered 2011-11-08: 3 mL via INTRAVENOUS
  Filled 2011-11-08 (×2): qty 3

## 2011-11-08 MED ORDER — SODIUM CHLORIDE 0.9 % IV BOLUS (SEPSIS)
500.0000 mL | Freq: Once | INTRAVENOUS | Status: AC
  Administered 2011-11-08: 13:00:00 via INTRAVENOUS

## 2011-11-08 MED ORDER — SERTRALINE HCL 50 MG PO TABS
150.0000 mg | ORAL_TABLET | Freq: Every day | ORAL | Status: DC
  Administered 2011-11-08 – 2011-11-10 (×3): 150 mg via ORAL
  Filled 2011-11-08 (×3): qty 3

## 2011-11-08 MED ORDER — SODIUM CHLORIDE 0.9 % IV BOLUS (SEPSIS)
1000.0000 mL | Freq: Once | INTRAVENOUS | Status: AC
  Administered 2011-11-08: 1000 mL via INTRAVENOUS

## 2011-11-08 MED ORDER — TRAZODONE HCL 50 MG PO TABS
150.0000 mg | ORAL_TABLET | Freq: Every day | ORAL | Status: DC
  Administered 2011-11-08 – 2011-11-10 (×3): 150 mg via ORAL
  Filled 2011-11-08 (×3): qty 3

## 2011-11-08 NOTE — ED Provider Notes (Signed)
History   This chart was scribed for Karla Hutching, MD by Charolett Bumpers . The patient was seen in room APA08/APA08. Patient's care was started at 1147.    CSN: [id number]  Arrival date & time 11/08/11  1133   First MD Initiated Contact with Patient 11/08/11 1147      Chief Complaint  Patient presents with  . Loss of Consciousness    (Consider location/radiation/quality/duration/timing/severity/associated sxs/prior treatment) HPI Karla Anderson is a 41 y.o. female who presents to the Emergency Department complaining of intermittent, moderate LOC since 10/02/11. Pt states that she has passed out at least 2 times a day for the last couple weeks. Pt states that it lasted for approximately 10-15 minutes. Pt states that her head feels funny, her legs feel week and then she passes out. Pt reports that her last episode was yesterday. Pt states that she had a pacemaker was placed 10/02/11 due to CHF by Karla Anderson. Pt states that she went to Dr. Letitia Anderson office this morning and was told to go to ED for evaluation. Pt reports a h/o CHF, HTN, enemia, bipolar and depression. Pt reports that her BP has been 80/40 over the past few weeks. Pt reports a cholecystectomy, partial gastrectomy and colectomy secondary to ulcers.   PCP: Dr. Felecia Anderson Cardiologist: Karla Anderson  Past Medical History  Diagnosis Date  . Tachycardia     Rate related LBBB - seen by Dr. Graciela Anderson and Dr. Dietrich Anderson in the past  . Anxiety   . Depression   . Heart murmur     No reported valvular disease  . Bipolar 1 disorder   . Hypertension   . 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 CHF (congestive heart failure)   . Anginal pain   . History of blood transfusion 10/2010  . Arthritis     Bilateral knee; back    Past Surgical History  Procedure Date  . Cesarean section 1997  . Tubal ligation reversal 2002  . Esophagogastroduodenoscopy 11/19/2010    Procedure: ESOPHAGOGASTRODUODENOSCOPY (EGD);  Surgeon: Karla Hippo, MD;  [facility];  Service: Endoscopy;  Laterality: N/A;  . Laparotomy 11/19/2010    Procedure: EXPLORATORY LAPAROTOMY;  Surgeon: Karla Anderson;  Location: [facility];  Service: General;  Laterality: N/A;  . Gastrectomy 11/19/2010    Procedure: GASTRECTOMY;  Surgeon: Karla Anderson;  Location: [facility];  Service: General;  Laterality: N/A;  Partial Gastrectomy  . Gastrojejunostomy 11/19/2010    Procedure: GASTROJEJUNOSTOMY;  Surgeon: Karla Anderson;  Location: [facility];  Service: General;  Laterality: N/A;  Roux-En-Y  . Cardiac defibrillator placement 10/02/11  . Cholecystectomy 11/09/2010    Procedure: LAPAROSCOPIC CHOLECYSTECTOMY;  Surgeon: Karla Anderson;  Location: [facility];  Service: General;  Laterality: N/A;  . Neuroplasty / transposition median nerve at carpal tunnel bilateral ~ 2008  . Tubal ligation 1999    Family History  Problem Relation Age of Onset  . Stroke Father     Died recently, Age 81  . Heart failure Mother   . Diabetes Sister   . Pneumonia Father   . Coronary artery disease Father   . Parkinsonism Mother     History  Substance Use Topics  . Smoking status: Never Smoker   . Smokeless tobacco: Never Used  . Alcohol Use: No    OB History    Grav Para Term Preterm Abortions TAB SAB Ect Mult Living  Review of Systems A complete 10 system review of systems was obtained and all systems are negative except as noted in the HPI and PMH.   Allergies  Review of patient's allergies indicates no known allergies.  Home Medications   Current Outpatient Rx  Name Route Sig Dispense Refill  . CARVEDILOL 12.5 MG PO TABS Oral Take 1 tablet (12.5 mg total) by mouth every 12 (twelve) hours. 60 tablet 3    Dose increase  . CYCLOBENZAPRINE HCL 10 MG PO TABS Oral Take 10 mg by mouth 3 (three) times daily as needed. For muscle spasms.    . FUROSEMIDE 40 MG PO TABS Oral Take 1 tablet (40 mg total) by mouth 2 (two) times daily. 60 tablet 0  .  HYDROCODONE-ACETAMINOPHEN 5-325 MG PO TABS Oral Take 1 tablet by mouth every 6 (six) hours as needed. For pain.    Marland Kitchen LISINOPRIL 5 MG PO TABS Oral Take 1 tablet (5 mg total) by mouth daily. 30 tablet 0  . ADULT ONE DAILY GUMMIES PO CHEW Oral Chew 2 each by mouth every morning.    Marland Kitchen POTASSIUM CHLORIDE CRYS ER 20 MEQ PO TBCR Oral Take 2 tablets (40 mEq total) by mouth daily. 60 tablet 12  . QUETIAPINE FUMARATE 300 MG PO TABS Oral Take 600 mg by mouth at bedtime.      . SERTRALINE HCL 100 MG PO TABS Oral Take 150 mg by mouth daily.     Marland Kitchen SPIRONOLACTONE 25 MG PO TABS Oral Take 1 tablet (25 mg total) by mouth daily. 30 tablet 3    Increase in dose  . TRAZODONE HCL 150 MG PO TABS Oral Take 150 mg by mouth at bedtime.      Marland Kitchen ZIPRASIDONE HCL 80 MG PO CAPS Oral Take 160 mg by mouth at bedtime.      Marland Kitchen ZOLPIDEM TARTRATE 10 MG PO TABS Oral Take 10 mg by mouth at bedtime.        BP 107/45  Pulse 94  Temp 98.7 F (37.1 C) (Oral)  Resp 18  Ht 5\' 5"  (1.651 m)  Wt 268 lb (121.564 kg)  BMI 44.60 kg/m2  SpO2 100%  LMP 11/05/2011  Physical Exam  Nursing note and vitals reviewed. Constitutional: She is oriented to person, place, and time. She appears well-developed and well-nourished. No distress.       Obese.   HENT:  Head: Normocephalic and atraumatic.  Eyes: EOM are normal. Pupils are equal, round, and reactive to light.  Neck: Normal range of motion. Neck supple. No tracheal deviation present.  Cardiovascular: Normal rate, regular rhythm and normal heart sounds.        Slightly hypotensive.   Pulmonary/Chest: Effort normal and breath sounds normal. No respiratory distress. She has no wheezes.  Abdominal: Soft. She exhibits no distension.  Musculoskeletal: Normal range of motion. She exhibits no edema.  Neurological: She is alert and oriented to person, place, and time. No sensory deficit.  Skin: Skin is warm and dry.  Psychiatric: She has a normal mood and affect. Her behavior is normal.     ED Course  Procedures (including critical care time)  DIAGNOSTIC STUDIES: Oxygen Saturation is 100% on room air, normal by my interpretation.    COORDINATION OF CARE:  12:44-Discussed planned course of treatment with the patient including blood work, UA and EKG, who is agreeable at this time.   13:00-Medication Orders: 0.9% sodium chloride infusion-continuous; Sodium chloride 0.9% bolus 500 mL-once.   14:15-Medication Orders: 0.9%  sodium chloride infusion-continuous; Sodium chloride 0.9% bolus 1,000 mL-once  Results for orders placed during the hospital encounter of 11/08/11  CBC WITH DIFFERENTIAL      Component Value Range   WBC 6.3  4.0 - 10.5 K/uL   RBC 3.53 (*) 3.87 - 5.11 MIL/uL   Hemoglobin 10.5 (*) 12.0 - 15.0 g/dL   HCT 16.1 (*) 09.6 - 04.5 %   MCV 86.7  78.0 - 100.0 fL   MCH 29.7  26.0 - 34.0 pg   MCHC 34.3  30.0 - 36.0 g/dL   RDW 40.9  81.1 - 91.4 %   Platelets 185  150 - 400 K/uL   Neutrophils Relative 59  43 - 77 %   Neutro Abs 3.7  1.7 - 7.7 K/uL   Lymphocytes Relative 28  12 - 46 %   Lymphs Abs 1.7  0.7 - 4.0 K/uL   Monocytes Relative 10  3 - 12 %   Monocytes Absolute 0.7  0.1 - 1.0 K/uL   Eosinophils Relative 2  0 - 5 %   Eosinophils Absolute 0.2  0.0 - 0.7 K/uL   Basophils Relative 1  0 - 1 %   Basophils Absolute 0.0  0.0 - 0.1 K/uL  BASIC METABOLIC PANEL      Component Value Range   Sodium 136  135 - 145 mEq/L   Potassium 4.4  3.5 - 5.1 mEq/L   Chloride 102  96 - 112 mEq/L   CO2 26  19 - 32 mEq/L   Glucose, Bld 85  70 - 99 mg/dL   BUN 29 (*) 6 - 23 mg/dL   Creatinine, Ser 7.82 (*) 0.50 - 1.10 mg/dL   Calcium 9.2  8.4 - 95.6 mg/dL   GFR calc non Af Amer 32 (*) >90 mL/min   GFR calc Af Amer 37 (*) >90 mL/min  URINALYSIS, ROUTINE W REFLEX MICROSCOPIC      Component Value Range   Color, Urine YELLOW  YELLOW   APPearance CLEAR  CLEAR   Specific Gravity, Urine 1.020  1.005 - 1.030   pH 6.0  5.0 - 8.0   Glucose, UA NEGATIVE  NEGATIVE mg/dL   Hgb  urine dipstick NEGATIVE  NEGATIVE   Bilirubin Urine NEGATIVE  NEGATIVE   Ketones, ur NEGATIVE  NEGATIVE mg/dL   Protein, ur NEGATIVE  NEGATIVE mg/dL   Urobilinogen, UA 0.2  0.0 - 1.0 mg/dL   Nitrite NEGATIVE  NEGATIVE   Leukocytes, UA NEGATIVE  NEGATIVE  PREGNANCY, URINE      Component Value Range   Preg Test, Ur NEGATIVE  NEGATIVE  TROPONIN I      Component Value Range   Troponin I <0.30  <0.30 ng/mL     No results found.   No diagnosis found.   Date: 11/08/2011  Rate: 89  Rhythm: pacemaker  QRS Axis: normal  Intervals: normal  ST/T Wave abnormalities: normal  Conduction Disutrbances:none  Narrative Interpretation:   Old EKG Reviewed: none available   MDM  Patient said daily syncopal spell since insertion of pacemaker N. July 3.  Blood pressure has been low per patient's history. She is hemodynamically stable here. Discussed with Dr. Felecia Anderson.  admit    I personally performed the services described in this documentation, which was scribed in my presence. The recorded information has been reviewed and considered.      Karla Hutching, MD 11/08/11 [phone]

## 2011-11-08 NOTE — ED Notes (Signed)
Report called and patient to be transferred to room 309 with portable monitor.

## 2011-11-08 NOTE — ED Notes (Signed)
Pt states that she has passed out at least 2 times a day for the last couple weeks. States that it lasted for approximately 5- 10 minutes. Pt states that her head feels funny and then she passes out. Last episode was yesterday.  Pt had pacemaker put in July 3 this year. Pt has swelling to her left lower leg and redness to bilateral lower legs but states that it has been going on for over a year.

## 2011-11-08 NOTE — ED Notes (Signed)
Family member given coke and crackers.  Patient resting comfortably c&ax4.

## 2011-11-08 NOTE — Progress Notes (Signed)
ANTICOAGULATION CONSULT NOTE - Initial Consult  Pharmacy Consult for Lovenox Indication: VTE prophylaxis  No Known Allergies  Patient Measurements: Height: 5\' 5"  (165.1 cm) Weight: 269 lb 10 oz (122.3 kg) IBW/kg (Calculated) : 57    Vital Signs: Temp: 97.8 F (36.6 C) (08/09 1650) Temp src: Oral (08/09 1650) BP: 120/74 mmHg (08/09 1650) Pulse Rate: 86  (08/09 1650)  Labs:  [facility] 11/08/11 2057 11/08/11 1249  HGB 9.7* 10.5*  HCT 28.8* 30.6*  PLT 164 185  APTT -- --  LABPROT -- --  INR -- --  HEPARINUNFRC -- --  CREATININE -- 1.91*  CKTOTAL -- --  CKMB -- --  TROPONINI -- <0.30    Estimated Creatinine Clearance: 51.4 ml/min (by C-G formula based on Cr of 1.91).   Medical History: Past Medical History  Diagnosis Date  . Tachycardia     Rate related LBBB - seen by Dr. Graciela Husbands and Dr. Dietrich Pates in the past  . Anxiety   . Depression   . Heart murmur     No reported valvular disease  . Bipolar 1 disorder   . Hypertension   . 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 CHF (congestive heart failure)   . Anginal pain   . History of blood transfusion 10/2010  . Arthritis     Bilateral knee; back    Medications:  Scheduled:    . carvedilol  12.5 mg Oral Q12H  . enoxaparin (LOVENOX) injection  60 mg Subcutaneous Q24H  . QUEtiapine  600 mg Oral QHS  . sertraline  150 mg Oral QHS  . sodium chloride  1,000 mL Intravenous Once  . sodium chloride  500 mL Intravenous Once  . sodium chloride  3 mL Intravenous Q12H  . traZODone  150 mg Oral QHS  . ziprasidone  160 mg Oral QHS  . zolpidem  10 mg Oral QHS    Assessment: Ok for protocol Patient weight 122.3 kg   BMI > 30 (44.8) CrCl > 30 ml/min  Goal of Therapy:  Lovenox VTE prophylaxis dose Monitor platelets by anticoagulation protocol: Yes   Plan:  Lovenox 60 mg SQ every 24 hours (0.5 mg/kg every 24 hours) Monitor platelets and renal function Labs per protocol  Raquel James, Farzana Koci  Bennett 11/08/2011,9:38 PM

## 2011-11-09 ENCOUNTER — Inpatient Hospital Stay (HOSPITAL_COMMUNITY): Payer: Medicaid Other

## 2011-11-09 LAB — BASIC METABOLIC PANEL
CO2: 26 mEq/L (ref 19–32)
Chloride: 107 mEq/L (ref 96–112)
Chloride: 108 mEq/L (ref 96–112)
GFR calc Af Amer: 49 mL/min — ABNORMAL LOW (ref >90)
GFR calc Af Amer: 49 mL/min — ABNORMAL LOW (ref >90)
GFR calc non Af Amer: 42 mL/min — ABNORMAL LOW (ref >90)
Potassium: 4.5 mEq/L (ref 3.5–5.1)
Potassium: 4.8 mEq/L (ref 3.5–5.1)
Sodium: 139 mEq/L (ref 135–145)

## 2011-11-09 LAB — CARDIAC PANEL(CRET KIN+CKTOT+MB+TROPI)
Relative Index: INVALID (ref 0.0–2.5)
Troponin I: <0.3 ng/mL (ref <0.30)
Troponin I: <0.3 ng/mL (ref <0.30)

## 2011-11-09 LAB — MAGNESIUM: Magnesium: 1.8 mg/dL (ref 1.5–2.5)

## 2011-11-09 MED ORDER — PROMETHAZINE HCL 12.5 MG PO TABS
25.0000 mg | ORAL_TABLET | Freq: Three times a day (TID) | ORAL | Status: DC | PRN
  Administered 2011-11-09 (×2): 25 mg via ORAL
  Filled 2011-11-09 (×2): qty 2

## 2011-11-09 MED ORDER — HYDROCODONE-ACETAMINOPHEN 5-325 MG PO TABS
1.0000 | ORAL_TABLET | Freq: Once | ORAL | Status: AC
  Administered 2011-11-09: 1 via ORAL

## 2011-11-09 NOTE — Progress Notes (Signed)
Subjective: Patient feels better today. She is not dizzy. No chest pain  Objective: Vital signs in last 24 hours: Temp:  [97.5 F (36.4 C)-98.7 F (37.1 C)] 97.5 F (36.4 C) (08/10 0631) Pulse Rate:  [83-96] 89  (08/10 0932) Resp:  [18-20] 20  (08/10 0932) BP: (102-126)/(45-84) 126/84 mmHg (08/10 0932) SpO2:  [96 %-100 %] 99 % (08/10 0932) Weight:  [121.564 kg (268 lb)-122.3 kg (269 lb 10 oz)] 122.3 kg (269 lb 10 oz) (08/09 1650) Weight change:  Last BM Date: 11/07/11  Intake/Output from previous day: 08/09 0701 - 08/10 0700 In: 1026.7 [P.O.:560; I.V.:466.7] Out: 4 [Urine:4]  PHYSICAL EXAM General appearance: alert and no distress Resp: clear to auscultation bilaterally Cardio: S1, S2 normal GI: soft, non-tender; bowel sounds normal; no masses,  no organomegaly Extremities: extremities normal, atraumatic, no cyanosis or edema  Lab Results:    @labtest @ [facility] No results found for this basename: PHART,PCO2,PO2ART,TCO2,HCO3 in the last 72 hours CULTURES Recent Results (from the past 240 hour(s))  MRSA PCR SCREENING     Status: Normal   Collection Time   11/08/11  4:22 PM      Component Value Range Status Comment   MRSA by PCR NEGATIVE  NEGATIVE Final    Studies/Results: No results found.  Medications: I have reviewed the patient's current medications.  Assesment: 1. Syncopal episode  2. Hypotension  3.Acute renal failure  4. H/o of CHF  5. S/P pacemaker insertion  6. H/O Bipolar disease   Active Problems:  * No active hospital problems. *     Plan: Continue telemetry Continue iv fluid Cardiology consult We will monitor BMP.    LOS: 1 day   Marton Malizia 11/09/2011, 11:13 AM

## 2011-11-09 NOTE — H&P (Signed)
Karla Anderson MRN: [id number] DOB/AGE: October 17, 1970 41 y.o. Primary Care Physician:Denee Boeder, MD Admit date: 11/08/2011 Chief Complaint: passed out HPI: This is a 41 years old female patient with history of multiple illness came to [facility] with the above complaints. She has history CHF and tachy arrhythmia.  Patient had pacemake insertion on 10/02/11. Patient experienced recurrent episode of syncope which lasted for 10 minute about one day before the admission. She had similar episodes previousely. She feels dizzy. No chest pain or shortness of breath. Her B/P was low during the evaluation in [facility]. No headache, convulsion, change in mental status. No fever, chills, nausea or vomiting.  Past Medical History  Diagnosis Date  . Tachycardia     Rate related LBBB - seen by Dr. Graciela Husbands and Dr. Dietrich Pates in the past  . Anxiety   . Depression   . Heart murmur     No reported valvular disease  . Bipolar 1 disorder   . Hypertension   . 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 CHF (congestive heart failure)   . Anginal pain   . History of blood transfusion 10/2010  . Arthritis     Bilateral knee; back   Past Surgical History  Procedure Date  . Cesarean section 1997  . Tubal ligation reversal 2002  . Esophagogastroduodenoscopy 11/19/2010    Procedure: ESOPHAGOGASTRODUODENOSCOPY (EGD);  Surgeon: Malissa Hippo, MD;  Location: [facility];  Service: Endoscopy;  Laterality: N/A;  . Laparotomy 11/19/2010    Procedure: EXPLORATORY LAPAROTOMY;  Surgeon: Fabio Bering;  Location: [facility];  Service: General;  Laterality: N/A;  . Gastrectomy 11/19/2010    Procedure: GASTRECTOMY;  Surgeon: Fabio Bering;  Location: [facility];  Service: General;  Laterality: N/A;  Partial Gastrectomy  . Gastrojejunostomy 11/19/2010    Procedure: GASTROJEJUNOSTOMY;  Surgeon: Fabio Bering;  Location: [facility];  Service: General;  Laterality: N/A;  Roux-En-Y  . Cardiac defibrillator placement 10/02/11  .  Cholecystectomy 11/09/2010    Procedure: LAPAROSCOPIC CHOLECYSTECTOMY;  Surgeon: Fabio Bering;  Location: [facility];  Service: General;  Laterality: N/A;  . Neuroplasty / transposition median nerve at carpal tunnel bilateral ~ 2008  . Tubal ligation 1999        Family History  Problem Relation Age of Onset  . Stroke Father     Died recently, Age 55  . Heart failure Mother   . Diabetes Sister   . Pneumonia Father   . Coronary artery disease Father   . Parkinsonism Mother     Social History:  reports that she has never smoked. She has never used smokeless tobacco. She reports that she does not drink alcohol or use illicit drugs.   Allergies: No Known Allergies  Medications Prior to Admission  Medication Sig Dispense Refill  . carvedilol (COREG) 12.5 MG tablet Take 1 tablet (12.5 mg total) by mouth every 12 (twelve) hours.  60 tablet  3  . furosemide (LASIX) 40 MG tablet Take 1 tablet (40 mg total) by mouth 2 (two) times daily.  60 tablet  0  . HYDROcodone-acetaminophen (NORCO) 5-325 MG per tablet Take 1 tablet by mouth every 6 (six) hours as needed. For pain.      Marland Kitchen lisinopril (PRINIVIL,ZESTRIL) 5 MG tablet Take 1 tablet (5 mg total) by mouth daily.  30 tablet  0  . Multiple Vitamins-Minerals (ADULT ONE DAILY GUMMIES) CHEW Chew 2 each by mouth every morning.      . potassium chloride SA (K-DUR,KLOR-CON)  20 MEQ tablet Take 20 mEq by mouth every morning.      Marland Kitchen QUEtiapine (SEROQUEL) 300 MG tablet Take 600 mg by mouth at bedtime.        . sertraline (ZOLOFT) 100 MG tablet Take 150 mg by mouth at bedtime.       Marland Kitchen spironolactone (ALDACTONE) 25 MG tablet Take 25 mg by mouth every morning.      Marland Kitchen tiZANidine (ZANAFLEX) 4 MG tablet Take 4 mg by mouth every 6 (six) hours as needed. For muscle spasms      . traZODone (DESYREL) 150 MG tablet Take 150 mg by mouth at bedtime.        . ziprasidone (GEODON) 80 MG capsule Take 160 mg by mouth at bedtime.        Marland Kitchen zolpidem (AMBIEN) 10 MG tablet  Take 10 mg by mouth at bedtime.             UJW:JXBJY from the symptoms mentioned above,there are no other symptoms referable to all systems reviewed.  Physical Exam: Blood pressure 126/84, pulse 89, temperature 97.5 F (36.4 C), temperature source Oral, resp. rate 20, height 5\' 5"  (1.651 m), weight 122.3 kg (269 lb 10 oz), last menstrual period 11/05/2011, SpO2 99.00%. General appearance- alert and awake, not in distress HE ENT pupils equal reactive               Neck supple CHEST- clear lung field CVS S1 and S2 heard regular Abd- soft and lax         Bowel sound heard         Obese EXT- no leg edema    Basename 11/08/11 2057 11/08/11 1249  WBC 5.4 6.3  NEUTROABS -- 3.7  HGB 9.7* 10.5*  HCT 28.8* 30.6*  MCV 87.3 86.7  PLT 164 185    Basename 11/09/11 0522 11/08/11 1249  NA 139 136  K 4.5 4.4  CL 107 102  CO2 26 26  GLUCOSE 92 85  BUN 23 29*  CREATININE 1.52* 1.91*  CALCIUM 8.9 9.2  MG -- --  lablast2(ast:2,ALT:2,alkphos:2,bilitot:2,prot:2,albumin:2)@    Recent Results (from the past 240 hour(s))  MRSA PCR SCREENING     Status: Normal   Collection Time   11/08/11  4:22 PM      Component Value Range Status Comment   MRSA by PCR NEGATIVE  NEGATIVE Final      No results found. Impression: 1. Syncopal episode 2. Hypotension 3.Acute renal failure 4. H/o of CHF 5. S/P pacemaker insertion 6. H/O Bipolar disease Active Problems:  * No active hospital problems. *      Plan: Admit under telemetry Gradual rehydrate Hold diuretics and antihypertensives Cardiology consult        [facility] Pager [phone]  11/09/2011, 10:58 AM

## 2011-11-09 NOTE — Progress Notes (Signed)
Patient complains of dizziness which has caused a headache. Dr. Janna Arch stated to order BMET and Mag blood and to give patient one extra Hydrocodone and continue to monitor patient. Patient resting in bed with family at bedside. Will continue to monitor patient.

## 2011-11-10 LAB — CBC
HCT: 28.7 % — ABNORMAL LOW (ref 36.0–46.0)
Hemoglobin: 9.5 g/dL — ABNORMAL LOW (ref 12.0–15.0)
MCHC: 33.1 g/dL (ref 30.0–36.0)
RDW: 14.4 % (ref 11.5–15.5)
WBC: 4.7 10*3/uL (ref 4.0–10.5)

## 2011-11-10 LAB — BASIC METABOLIC PANEL
BUN: 19 mg/dL (ref 6–23)
Chloride: 108 mEq/L (ref 96–112)
GFR calc Af Amer: 51 mL/min — ABNORMAL LOW (ref >90)
GFR calc non Af Amer: 44 mL/min — ABNORMAL LOW (ref >90)
Glucose, Bld: 88 mg/dL (ref 70–99)
Potassium: 4.4 mEq/L (ref 3.5–5.1)
Sodium: 139 mEq/L (ref 135–145)

## 2011-11-10 NOTE — Progress Notes (Signed)
Subjective: Patient had an episode of dizziness yesterday. She feels better tody. No chest pain, nausea or vomiting. Objective: Vital signs in last 24 hours: Temp:  [97.9 F (36.6 C)-98.1 F (36.7 C)] 97.9 F (36.6 C) (08/11 1334) Pulse Rate:  [80-86] 86  (08/11 1334) Resp:  [18-20] 18  (08/11 1334) BP: (92-112)/(59-70) 112/67 mmHg (08/11 1334) SpO2:  [96 %-98 %] 97 % (08/11 1334) Weight change:  Last BM Date: 11/09/11  Intake/Output from previous day: 08/10 0701 - 08/11 0700 In: 4811.7 [P.O.:1170; I.V.:3641.7] Out: -   PHYSICAL EXAM General appearance: alert and no distress Resp: clear to auscultation bilaterally Cardio: S1, S2 normal GI: soft, non-tender; bowel sounds normal; no masses,  no organomegaly Extremities: extremities normal, atraumatic, no cyanosis or edema  Lab Results:    @labtest @ [facility] No results found for this basename: PHART,PCO2,PO2ART,TCO2,HCO3 in the last 72 hours CULTURES Recent Results (from the past 240 hour(s))  MRSA PCR SCREENING     Status: Normal   Collection Time   11/08/11  4:22 PM      Component Value Range Status Comment   MRSA by PCR NEGATIVE  NEGATIVE Final    Studies/Results: US Carotid Duplex Bilateral  11/09/2011  *RADIOLOGY REPORT*  Clinical Data: History of hypertension, syncopal episode, former smoker  BILATERAL CAROTID DUPLEX ULTRASOUND  Technique: Wallace Cullens scale imaging, color Doppler and duplex ultrasound was performed of bilateral carotid and vertebral arteries in the neck.  Comparison:  None.  Criteria:  Quantification of carotid stenosis is based on velocity parameters that correlate the residual internal carotid diameter with NASCET-based stenosis levels, using the diameter of the distal internal carotid lumen as the denominator for stenosis measurement.  The following velocity measurements were obtained:                   PEAK SYSTOLIC/END DIASTOLIC RIGHT ICA:                        91/40cm/sec CCA:                         92/21cm/sec SYSTOLIC ICA/CCA RATIO:     0.99 DIASTOLIC ICA/CCA RATIO:    1.88 ECA:                        92cm/sec  LEFT ICA:                        87/31cm/sec CCA:                        109/27cm/sec SYSTOLIC ICA/CCA RATIO:     0.79 DIASTOLIC ICA/CCA RATIO:    1.15 ECA:                        67cm/sec  Findings:  RIGHT CAROTID ARTERY: There is minimal intimal wall thickening and atherosclerotic plaque within the right carotid bulb which does not result in elevated peak systolic velocities to suggest a hemodynamically significant stenosis.  RIGHT VERTEBRAL ARTERY:  Antegrade flow  LEFT CAROTID ARTERY: There is a minimal amount of intimal wall thickening and plaque within the left carotid bulb (image 51) not resulting in elevated peak systolic velocities within the internal carotid artery to suggest hemodynamically significant stenosis.  LEFT VERTEBRAL ARTERY:  Antegrade flow  IMPRESSION: Minimal atherosclerotic plaque bilaterally, left subjectively more than right,  not resulting in hemodynamically significant stenosis.  Original Report Authenticated By: Waynard Reeds, M.D.    Medications: I have reviewed the patient's current medications.  Assesment: 1. Syncopal episode  2. Hypotension  3.Acute renal failure  improved 4. H/o of CHF  5. S/P pacemaker insertion  6. H/O Bipolar disease   Active Problems:  * No active hospital problems. *     Plan: Continue telemetry Continue iv fluid Cardiology consult We will monitor BMP.    LOS: 2 days   Koehn Salehi 11/10/2011, 3:57 PM

## 2011-11-11 DIAGNOSIS — R55 Syncope and collapse: Secondary | ICD-10-CM

## 2011-11-11 LAB — IRON AND TIBC
Iron: 69 ug/dL (ref 42–135)
Saturation Ratios: 20 % (ref 20–55)
TIBC: 342 ug/dL (ref 250–470)
UIBC: 273 ug/dL (ref 125–400)

## 2011-11-11 LAB — RETICULOCYTES: RBC.: 3.27 MIL/uL — ABNORMAL LOW (ref 3.87–5.11)

## 2011-11-11 LAB — CBC
HCT: 29.7 % — ABNORMAL LOW (ref 36.0–46.0)
Hemoglobin: 9.8 g/dL — ABNORMAL LOW (ref 12.0–15.0)
RBC: 3.32 MIL/uL — ABNORMAL LOW (ref 3.87–5.11)

## 2011-11-11 LAB — FOLATE: Folate: 12.1 ng/mL

## 2011-11-11 LAB — VITAMIN B12: Vitamin B-12: 317 pg/mL (ref 211–911)

## 2011-11-11 MED ORDER — FUROSEMIDE 40 MG PO TABS
40.0000 mg | ORAL_TABLET | Freq: Two times a day (BID) | ORAL | Status: DC
  Administered 2011-11-11 (×2): 40 mg via ORAL
  Filled 2011-11-11 (×2): qty 1

## 2011-11-11 MED ORDER — CARVEDILOL 3.125 MG PO TABS
6.2500 mg | ORAL_TABLET | Freq: Two times a day (BID) | ORAL | Status: DC
  Administered 2011-11-11: 6.25 mg via ORAL
  Filled 2011-11-11: qty 2

## 2011-11-11 MED ORDER — FUROSEMIDE 40 MG PO TABS: 40.0000 mg | ORAL_TABLET | Freq: Two times a day (BID) | ORAL | Status: DC

## 2011-11-11 MED ORDER — CARVEDILOL 6.25 MG PO TABS: 6.2500 mg | ORAL_TABLET | Freq: Two times a day (BID) | ORAL | Status: DC

## 2011-11-11 MED ORDER — CARVEDILOL 3.125 MG PO TABS: 3.1250 mg | ORAL_TABLET | Freq: Two times a day (BID) | ORAL | Status: DC

## 2011-11-11 MED ORDER — POTASSIUM CHLORIDE CRYS ER 20 MEQ PO TBCR: 40.0000 meq | EXTENDED_RELEASE_TABLET | Freq: Every day | ORAL | Status: DC

## 2011-11-11 NOTE — Progress Notes (Signed)
UR chart review completed.  

## 2011-11-11 NOTE — Progress Notes (Signed)
Subjective: Patient still complains of dizziness. She has no chest pain. She being slowly rehydrated.  Objective: Vital signs in last 24 hours: Temp:  [97.7 F (36.5 C)-98.1 F (36.7 C)] 97.7 F (36.5 C) (08/12 0515) Pulse Rate:  [80-88] 80  (08/12 0515) Resp:  [18] 18  (08/12 0515) BP: (102-115)/(63-78) 115/78 mmHg (08/12 0515) SpO2:  [97 %-99 %] 98 % (08/12 0515) Weight change:  Last BM Date: 11/10/11  Intake/Output from previous day: 08/11 0701 - 08/12 0700 In: 2911.6 [P.O.:1260; I.V.:1651.6] Out: 1 [Urine:1]  PHYSICAL EXAM General appearance: alert and no distress Resp: clear to auscultation bilaterally Cardio: S1, S2 normal GI: soft, non-tender; bowel sounds normal; no masses,  no organomegaly Extremities: extremities normal, atraumatic, no cyanosis or edema  Lab Results:    @labtest @ [facility] No results found for this basename: PHART,PCO2,PO2ART,TCO2,HCO3 in the last 72 hours CULTURES Recent Results (from the past 240 hour(s))  MRSA PCR SCREENING     Status: Normal   Collection Time   11/08/11  4:22 PM      Component Value Range Status Comment   MRSA by PCR NEGATIVE  NEGATIVE Final    Studies/Results: US Carotid Duplex Bilateral  11/09/2011  *RADIOLOGY REPORT*  Clinical Data: History of hypertension, syncopal episode, former smoker  BILATERAL CAROTID DUPLEX ULTRASOUND  Technique: Wallace Cullens scale imaging, color Doppler and duplex ultrasound was performed of bilateral carotid and vertebral arteries in the neck.  Comparison:  None.  Criteria:  Quantification of carotid stenosis is based on velocity parameters that correlate the residual internal carotid diameter with NASCET-based stenosis levels, using the diameter of the distal internal carotid lumen as the denominator for stenosis measurement.  The following velocity measurements were obtained:                   PEAK SYSTOLIC/END DIASTOLIC RIGHT ICA:                        91/40cm/sec CCA:                        92/21cm/sec  SYSTOLIC ICA/CCA RATIO:     0.99 DIASTOLIC ICA/CCA RATIO:    1.88 ECA:                        92cm/sec  LEFT ICA:                        87/31cm/sec CCA:                        109/27cm/sec SYSTOLIC ICA/CCA RATIO:     0.79 DIASTOLIC ICA/CCA RATIO:    1.15 ECA:                        67cm/sec  Findings:  RIGHT CAROTID ARTERY: There is minimal intimal wall thickening and atherosclerotic plaque within the right carotid bulb which does not result in elevated peak systolic velocities to suggest a hemodynamically significant stenosis.  RIGHT VERTEBRAL ARTERY:  Antegrade flow  LEFT CAROTID ARTERY: There is a minimal amount of intimal wall thickening and plaque within the left carotid bulb (image 51) not resulting in elevated peak systolic velocities within the internal carotid artery to suggest hemodynamically significant stenosis.  LEFT VERTEBRAL ARTERY:  Antegrade flow  IMPRESSION: Minimal atherosclerotic plaque bilaterally, left subjectively more than right, not resulting  in hemodynamically significant stenosis.  Original Report Authenticated By: Waynard Reeds, M.D.    Medications: I have reviewed the patient's current medications.  Assesment: 1. Syncopal episode  2. Hypotension -improving 3.Acute renal failure  improved 4. H/o of CHF  5. S/P pacemaker insertion  6. H/O Bipolar disease   Active Problems:  * No active hospital problems. *     Plan: Cardiology consult is pending Continue slow hydration BMP in Am.     LOS: 3 days   Zenora Karpel 11/11/2011, 7:53 AM

## 2011-11-11 NOTE — Progress Notes (Signed)
Pt discharged to home accompanied by spouse. Discharge instructions given and explained.  Pt verbalized understanding. Pt left floor in stable condition via wheelchair.

## 2011-11-11 NOTE — Care Management Note (Unsigned)
    Page 1 of 1   11/11/2011     1:32:23 PM   CARE MANAGEMENT NOTE 11/11/2011  Patient:  Karla Anderson, Karla Anderson   Account Number:  [id number]  Date Initiated:  11/11/2011  Documentation initiated by:  Sharrie Rothman  Subjective/Objective Assessment:   Pt admitted from home with syncope. Pt lives with husband and will return home with him at discharge. Pt is independent with ADL's. Pt has used [facility] in the past but no longer requires their services.     Action/Plan:   No CM/HH needs noted. Pt has wheelchair and BSC in the home for prn use. Pt denies any other DME needs at this time.   Anticipated DC Date:  11/11/2011   Anticipated DC Plan:  HOME/SELF CARE      DC Planning Services  CM consult      Choice offered to / List presented to:             Status of service:  Completed, signed off Medicare Important Message given?   (If response is "NO", the following Medicare IM given date fields will be blank) Date Medicare IM given:   Date Additional Medicare IM given:    Discharge Disposition:    Per UR Regulation:    If discussed at Long Length of Stay Meetings, dates discussed:    Comments:  11/11/11 1331 Arlyss Queen, RN BSN CM

## 2011-11-11 NOTE — Consult Note (Signed)
CARDIOLOGY CONSULT NOTE  Patient ID: Karla Anderson MRN: [id number] DOB/AGE: [birth year] 41 y.o.  Admit date: 11/08/2011 Referring Physician: Tonny Bollman, MD Primary Cardiologist: Vesta Mixer Reason for Consultation: Syncope s/p ICD pacemaker Active Problems:  ARF (acute renal failure)  Secondary cardiomyopathy, unspecified  Syncope and collapse  Morbid obesity  Bipolar 1 disorder  HPI: Karla Anderson is a 41 year old morbidly obese female patient of Dr. Simona Huh, Dr. Sharrell Ku, we have see for ongoing assessment treatment of severe, nonischemic systolic dysfunction, with most recent echo in April of 2013 showing an EF of 10-15%. Karla Anderson has recently had by the ICD implantation on 10/02/2011 with a [employer]( [employer] defibrillation lead, serial number [id number] was advanced into the right ventricle. The [employer] Dextrus model [phone], active fixation pacing lead, serial number [id number] was advanced into the right atrium. [employer] ACUITY steerable bipolar pacing lead, serial number  [id number] was advanced into the lateral vein. [employer] [facility] CRT-D BiV ICD, serial  number [id number].) per Dr. Sharrell Ku. Karla Anderson was seen in the office on followup on 10/18/2011 for interrogation and wound check.    Since having ICD pacemaker placed, the patient has been having daily episodes of syncope. Karla Anderson states that it occurs at any time during the day, usually when Karla Anderson is up walking. Karla Anderson passes out and awakens approximately 5-10 minutes later. This is usually witnessed by her husband. Karla Anderson has called our office, to report this, and was referred to ER for further evaluation of pacemaker for assessment on ventricular arrhythmias causing this episode. Karla Anderson refused medical attention at that time, but on 11/08/2011 decided to come to ER secondary recurrent symptoms. Karla Anderson states Karla Anderson has not felt her ICD fire, Karla Anderson denies any pain, shortness of breath, or  aura or weakness associated with these episodes .    On arrival to the emergency room the patient's blood pressure was 102/71, pulse 87, Karla Anderson was afebrile at 98.7. He was noted that her creatinine was 1.91, with a BUN of 29. Sodium was 136. Hemoglobin was 10.5 with hematocrit of 30.6 slightly lower than blood work completed on 10/01/2011 with a hemoglobin of 11.4 and hematocrit of 35.5. Karla Anderson was given IV fluid infusion of normal saline and admitted for further observation. Initially Lasix and Coreg, along with spironolactone were held. Blood pressure remained low normal, although no orthostatics were completed. On this evaluation Karla Anderson states Karla Anderson is feeling much better and has not had any dizziness or syncope since Karla Anderson has been admitted. Karla Anderson states Karla Anderson was up walking at all yesterday and did not have any symptoms.       Review of systems complete and found to be negative unless listed above   Past Medical History  Diagnosis Date  . Tachycardia     Rate related LBBB - seen by Dr. Graciela Husbands and Dr. Dietrich Pates in the past  . Anxiety   . Depression   . Heart murmur     No reported valvular disease  . Bipolar 1 disorder   . Hypertension   . 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 CHF (congestive heart failure)   . Anginal pain   . History of blood transfusion 10/2010  . Arthritis     Bilateral knee; back    Family History  Problem Relation Age of Onset  . Stroke Father     Died recently, Age 57  . Heart failure Mother   . Diabetes Sister   . Pneumonia Father   .  Coronary artery disease Father   . Parkinsonism Mother     History   Social History  . Marital Status: Married    Spouse Name: N/A    Number of Children: N/A  . Years of Education: N/A   Occupational History  . Not on file.   Social History Main Topics  . Smoking status: Never Smoker   . Smokeless tobacco: Never Used  . Alcohol Use: No  . Drug Use: No  . Sexually Active: Yes    Birth Control/ Protection:  None   Other Topics Concern  . Not on file   Social History Narrative  . No narrative on file    Past Surgical History  Procedure Date  . Cesarean section 1997  . Tubal ligation reversal 2002  . Esophagogastroduodenoscopy 11/19/2010    Procedure: ESOPHAGOGASTRODUODENOSCOPY (EGD);  Surgeon: Malissa Hippo, MD;  [facility];  Service: Endoscopy;  Laterality: N/A;  . Laparotomy 11/19/2010    Procedure: EXPLORATORY LAPAROTOMY;  Surgeon: Fabio Bering;  Location: [facility];  Service: General;  Laterality: N/A;  . Gastrectomy 11/19/2010    Procedure: GASTRECTOMY;  Surgeon: Fabio Bering;  Location: [facility];  Service: General;  Laterality: N/A;  Partial Gastrectomy  . Gastrojejunostomy 11/19/2010    Procedure: GASTROJEJUNOSTOMY;  Surgeon: Fabio Bering;  Location: [facility];  Service: General;  Laterality: N/A;  Roux-En-Y  . Cardiac defibrillator placement 10/02/11  . Cholecystectomy 11/09/2010    Procedure: LAPAROSCOPIC CHOLECYSTECTOMY;  Surgeon: Fabio Bering;  Location: [facility];  Service: General;  Laterality: N/A;  . Neuroplasty / transposition median nerve at carpal tunnel bilateral ~ 2008  . Tubal ligation 1999     Prescriptions prior to admission  Medication Sig Dispense Refill  . carvedilol (COREG) 12.5 MG tablet Take 1 tablet (12.5 mg total) by mouth every 12 (twelve) hours.  60 tablet  3  . furosemide (LASIX) 40 MG tablet Take 1 tablet (40 mg total) by mouth 2 (two) times daily.  60 tablet  0  . HYDROcodone-acetaminophen (NORCO) 5-325 MG per tablet Take 1 tablet by mouth every 6 (six) hours as needed. For pain.      Marland Kitchen lisinopril (PRINIVIL,ZESTRIL) 5 MG tablet Take 1 tablet (5 mg total) by mouth daily.  30 tablet  0  . Multiple Vitamins-Minerals (ADULT ONE DAILY GUMMIES) CHEW Chew 2 each by mouth every morning.      . potassium chloride SA (K-DUR,KLOR-CON) 20 MEQ tablet Take 20 mEq by mouth every morning.      Marland Kitchen QUEtiapine (SEROQUEL) 300 MG tablet Take 600 mg by mouth at  bedtime.        . sertraline (ZOLOFT) 100 MG tablet Take 150 mg by mouth at bedtime.       Marland Kitchen spironolactone (ALDACTONE) 25 MG tablet Take 25 mg by mouth every morning.      Marland Kitchen tiZANidine (ZANAFLEX) 4 MG tablet Take 4 mg by mouth every 6 (six) hours as needed. For muscle spasms      . traZODone (DESYREL) 150 MG tablet Take 150 mg by mouth at bedtime.        . ziprasidone (GEODON) 80 MG capsule Take 160 mg by mouth at bedtime.        Marland Kitchen zolpidem (AMBIEN) 10 MG tablet Take 10 mg by mouth at bedtime.         Echo: 09/03/2011 Left ventricle: The cavity size was mildly to moderately dilated. Wall thickness was normal. Systolic function was severely  reduced. The estimated ejection fraction was in the range of 10% to 15%. Only the basilar inferior and inferolateral myocardium comtracted normally. Other segments were severely hypokinetic to akinetic. - Ventricular septum: Mildly paradoxic septal motion with mild diastolic flattening. - Mitral valve: Mild regurgitation. - Left atrium: The atrium was moderately dilated. - Right ventricle: The cavity size was mildly dilated. Wall thickness was normal. - Atrial septum: No defect or patent foramen ovale was identified. - Tricuspid valve: Mild-moderate regurgitation. - Pulmonary arteries: Systolic pressure was mildly to moderately increased.   Physical Exam: Blood pressure 115/78, pulse 80, temperature 97.7 F (36.5 C), temperature source Oral, resp. rate 18, height 5\' 5"  (1.651 m), weight 269 lb 10 oz (122.3 kg), last menstrual period 11/05/2011, SpO2 98.00%.   General: Well developed, well nourished, in no acute distress, morbidly obese. Head: Eyes PERRLA, No xanthomas.   Normal cephalic and atramatic  Lungs: Clear bilaterally to auscultation and percussion. Heart: HRRR S1 S2, without MRG.  Pulses are 2+ & equal.            No carotid bruit. No JVD.  No abdominal bruits. No femoral bruits. Abdomen: Bowel sounds are positive, abdomen soft and  non-tender without masses or Hernia's noted. Msk:  Back normal, normal gait. Normal strength and tone for age.Pacemaker site in left upper chest.  Extremities: No clubbing, cyanosis or edema.  DP +1 Neuro: Alert and oriented X 3. Psych:  Good affect, responds appropriately    Labs:   Lab Results  Component Value Date   WBC 4.2 11/11/2011   HGB 9.8* 11/11/2011   HCT 29.7* 11/11/2011   MCV 89.5 11/11/2011   PLT 172 11/11/2011     Lab 11/10/11 0545  NA 139  K 4.4  CL 108  CO2 28  BUN 19  CREATININE 1.46*  CALCIUM 8.7  PROT --  BILITOT --  ALKPHOS --  ALT --  AST --  GLUCOSE 88   Lab Results  Component Value Date   CKTOTAL 56 11/09/2011   CKMB 1.7 11/09/2011   TROPONINI <0.30 11/09/2011    Lab Results  Component Value Date   CHOL 76 07/04/2011   Lab Results  Component Value Date   HDL 25* 07/04/2011   Lab Results  Component Value Date   LDLCALC 33 07/04/2011   Lab Results  Component Value Date   TRIG 90 07/04/2011   Lab Results  Component Value Date   CHOLHDL 3.0 07/04/2011   No results found for this basename: LDLDIRECT      Radiology: US Carotid Duplex Bilateral  11/09/2011  *RADIOLOGY REPORT*  Clinical Data: History of hypertension, syncopal episode, former smoker  BILATERAL CAROTID DUPLEX ULTRASOUND  IMPRESSION: Minimal atherosclerotic plaque bilaterally, left subjectively more than right, not resulting in hemodynamically significant stenosis.  Original Report Authenticated By: Waynard Reeds, M.D.   EKG:AV paced: Rate of 90 bpm  ASSESSMENT AND PLAN:   1.  Syncopal episode: Ischemia multifactorial, associated with medications, dehydration, and renal failure. Karla Anderson is feeling much better with hydration. With EF of 10-15% with limit too much of this. Creatinine has improved from 1.91-1.52. Pro BNP was elevated at 1445. I have asked for orthostatics to be completed, as I do not see any  documented.  2. Nonischemic CM with EF of 10-15%: Karla Anderson is recently had a [employer] ICD implantation completed one month ago per Dr. Ladona Ridgel. Karla Anderson did have a followup pacemaker check in the office and it was found to  be functioning appropriately. Karla Anderson will need at the very least to have Coreg restarted to avoid arrhythmias. Do not restart spironolactone with history of renal insufficiency. Can consider restarting Lasix at lower dose to avoid recurrent systolic CHF. Karla Anderson will need close followup in our office.  3. Anemia: It is noted that her hemoglobin has dropped 2 g since 10/01/2011,  Hgb 11.4 to  hemoglobin to 9.7. Anemia profile will be completed for further evaluation. This can be followed as an outpatient if not significantly abnormal.   Signed: Bettey Mare. Lyman Bishop NP [facility] 11/11/2011, 8:17 AM Co-Sign MD  Patient examined chart reviewed.  Karla Anderson has had her dizzyness while being monitored in the ER and once on floor with no arrythmia and stable vitals.  AICD functioning normally so there is no evidence of cardiac etiology to her symptoms.  I would stop hydrating her as Karla Anderson is quite positive and has an EF of 10% with elevated BNP on admission.  Agree with holding aldactone and resume coreg 6.25 bid.  Outpatient F/U with Bronson Curb next week or two and new patient evaluation with Dr Teressa Lower advanced CHF clinic in [place] within the next month.  Charlton Haws 8:48 AM 11/11/2011

## 2011-11-11 NOTE — Discharge Summary (Signed)
Physician Discharge Summary  Patient ID: Karla Anderson MRN: [id number] DOB/AGE: 40-24-1972 40 y.o. Primary Care Physician:Izamar Linden, MD Admit date: 11/08/2011 Discharge date: 11/11/2011    Discharge Diagnoses:  Hypotensition Active Problems:  Morbid obesity  Bipolar 1 disorder  ARF (acute renal failure)  Secondary cardiomyopathy, unspecified  Syncope and collapse   Medication List  As of 11/11/2011  5:10 PM   STOP taking these medications         lisinopril 5 MG tablet      spironolactone 25 MG tablet         TAKE these medications         ADULT ONE DAILY GUMMIES Chew   Chew 2 each by mouth every morning.      carvedilol 6.25 MG tablet   Commonly known as: COREG   Take 1 tablet (6.25 mg total) by mouth 2 (two) times daily with a meal.      furosemide 40 MG tablet   Commonly known as: LASIX   Take 1 tablet (40 mg total) by mouth 2 (two) times daily.      HYDROcodone-acetaminophen 5-325 MG per tablet   Commonly known as: NORCO/VICODIN   Take 1 tablet by mouth every 6 (six) hours as needed. For pain.      potassium chloride SA 20 MEQ tablet   Commonly known as: K-DUR,KLOR-CON   Take 2 tablets (40 mEq total) by mouth daily.      QUEtiapine 300 MG tablet   Commonly known as: SEROQUEL   Take 600 mg by mouth at bedtime.      sertraline 100 MG tablet   Commonly known as: ZOLOFT   Take 150 mg by mouth at bedtime.      tiZANidine 4 MG tablet   Commonly known as: ZANAFLEX   Take 4 mg by mouth every 6 (six) hours as needed. For muscle spasms      traZODone 150 MG tablet   Commonly known as: DESYREL   Take 150 mg by mouth at bedtime.      ziprasidone 80 MG capsule   Commonly known as: GEODON   Take 160 mg by mouth at bedtime.      zolpidem 10 MG tablet   Commonly known as: AMBIEN   Take 10 mg by mouth at bedtime.            Discharged Condition: stable    Consults: cardiology  Significant Diagnostic Studies: US Carotid Duplex  Bilateral  11/09/2011  *RADIOLOGY REPORT*  Clinical Data: History of hypertension, syncopal episode, former smoker  BILATERAL CAROTID DUPLEX ULTRASOUND  Technique: Wallace Cullens scale imaging, color Doppler and duplex ultrasound was performed of bilateral carotid and vertebral arteries in the neck.  Comparison:  None.  Criteria:  Quantification of carotid stenosis is based on velocity parameters that correlate the residual internal carotid diameter with NASCET-based stenosis levels, using the diameter of the distal internal carotid lumen as the denominator for stenosis measurement.  The following velocity measurements were obtained:                   PEAK SYSTOLIC/END DIASTOLIC RIGHT ICA:                        91/40cm/sec CCA:                        92/21cm/sec SYSTOLIC ICA/CCA RATIO:     0.99 DIASTOLIC ICA/CCA RATIO:  1.88 ECA:                        92cm/sec  LEFT ICA:                        87/31cm/sec CCA:                        109/27cm/sec SYSTOLIC ICA/CCA RATIO:     0.79 DIASTOLIC ICA/CCA RATIO:    1.15 ECA:                        67cm/sec  Findings:  RIGHT CAROTID ARTERY: There is minimal intimal wall thickening and atherosclerotic plaque within the right carotid bulb which does not result in elevated peak systolic velocities to suggest a hemodynamically significant stenosis.  RIGHT VERTEBRAL ARTERY:  Antegrade flow  LEFT CAROTID ARTERY: There is a minimal amount of intimal wall thickening and plaque within the left carotid bulb (image 51) not resulting in elevated peak systolic velocities within the internal carotid artery to suggest hemodynamically significant stenosis.  LEFT VERTEBRAL ARTERY:  Antegrade flow  IMPRESSION: Minimal atherosclerotic plaque bilaterally, left subjectively more than right, not resulting in hemodynamically significant stenosis.  Original Report Authenticated By: Waynard Reeds, M.D.    Lab Results: Basic Metabolic Panel:  Basename 11/10/11 0545 11/09/11 1744  NA 139 138  K 4.4  4.8  CL 108 108  CO2 28 26  GLUCOSE 88 87  BUN 19 21  CREATININE 1.46* 1.52*  CALCIUM 8.7 8.7  MG -- 1.8  PHOS -- --   Liver Function Tests: No results found for this basename: AST:2,ALT:2,ALKPHOS:2,BILITOT:2,PROT:2,ALBUMIN:2 in the last 72 hours   CBC:  Basename 11/11/11 0520 11/10/11 0545  WBC 4.2 4.7  NEUTROABS -- --  HGB 9.8* 9.5*  HCT 29.7* 28.7*  MCV 89.5 89.4  PLT 172 162    Recent Results (from the past 240 hour(s))  MRSA PCR SCREENING     Status: Normal   Collection Time   11/08/11  4:22 PM      Component Value Range Status Comment   MRSA by PCR NEGATIVE  NEGATIVE Final      Hospital Course:  This is a 41 years old female patient with history of multiple medical illness was admitted due to syncopal episode, hypotension and acte renal failure. She was gradually hydrated and improved. She was evaluated by cardiology who advised to discharge make out patient follow up.  Discharge Exam: Blood pressure 96/60, pulse 83, temperature 98 F (36.7 C), temperature source Oral, resp. rate 20, height 5\' 5"  (1.651 m), weight 122.3 kg (269 lb 10 oz), last menstrual period 11/05/2011, SpO2 98.00%.   Disposition:  home  Discharge Orders    Future Appointments: Provider: Department: Dept Phone: Center:   11/18/2011 1:40 PM Jodelle Gross, NP [facility] [phone] [facility]   11/25/2011 10:30 AM [facility] [phone] None   12/13/2011 11:20 AM Jodelle Gross, NP [facility] [phone] [facility]   01/23/2012 9:15 AM Marinus Maw, MD [facility] [phone] [id number]      Follow-up Information    Follow up with [facility], MD in 1 week.   Contact information:   [address] [place] [address] [phone]          Signed: Avon Gully   11/11/2011, 5:10 PM

## 2011-11-18 ENCOUNTER — Ambulatory Visit (INDEPENDENT_AMBULATORY_CARE_PROVIDER_SITE_OTHER): Payer: Medicaid Other | Admitting: Adult Health

## 2011-11-18 ENCOUNTER — Encounter: Payer: Self-pay | Admitting: Adult Health

## 2011-11-18 VITALS — BP 110/70 | HR 72 | Ht 65.0 in | Wt 270.8 lb

## 2011-11-18 DIAGNOSIS — R55 Syncope and collapse: Secondary | ICD-10-CM

## 2011-11-18 DIAGNOSIS — I429 Cardiomyopathy, unspecified: Secondary | ICD-10-CM

## 2011-11-18 NOTE — Assessment & Plan Note (Signed)
There is no evidence of fluid overload, dehydration, or hypotension. She will continue on current medication regimens and followup with Dr. Ladona Ridgel for continued ICD interrogations. We will see her again in a couple months for ongoing treatment.
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
## 2011-11-18 NOTE — Patient Instructions (Addendum)
Your physician recommends that you schedule a follow-up appointment in: 2 months with Joni Reining, NP.

## 2011-11-25 ENCOUNTER — Ambulatory Visit (HOSPITAL_COMMUNITY): Payer: Medicaid Other | Attending: Internal Medicine

## 2011-12-05 ENCOUNTER — Ambulatory Visit (INDEPENDENT_AMBULATORY_CARE_PROVIDER_SITE_OTHER): Payer: Medicaid Other | Admitting: Adult Health

## 2011-12-05 ENCOUNTER — Encounter: Payer: Self-pay | Admitting: Adult Health

## 2011-12-05 VITALS — BP 118/80 | HR 97 | Ht 65.0 in | Wt 271.8 lb

## 2011-12-05 DIAGNOSIS — R5381 Other malaise: Secondary | ICD-10-CM

## 2011-12-05 DIAGNOSIS — R079 Chest pain, unspecified: Secondary | ICD-10-CM

## 2011-12-05 DIAGNOSIS — I10 Essential (primary) hypertension: Secondary | ICD-10-CM

## 2011-12-05 DIAGNOSIS — I5042 Chronic combined systolic (congestive) and diastolic (congestive) heart failure: Secondary | ICD-10-CM

## 2011-12-05 DIAGNOSIS — I429 Cardiomyopathy, unspecified: Secondary | ICD-10-CM

## 2011-12-05 DIAGNOSIS — R5383 Other fatigue: Secondary | ICD-10-CM

## 2011-12-05 MED ORDER — CARVEDILOL 12.5 MG PO TABS: 12.5000 mg | ORAL_TABLET | Freq: Two times a day (BID) | ORAL | Status: DC

## 2011-12-05 NOTE — Assessment & Plan Note (Signed)
I am uncertain that these symptoms are related to her cardiomyopathy. She does not appear to be dehydrated. I did orthostatic blood pressures and have found them to be negative. Lying blood pressure 120/60 sitting blood pressure 118/60 standing the pressure 118/62. Her heart rate ranged from 87-101. Rising was standing.   I will increase her Coreg to 12.5 mg twice a day if she remains tachycardic. Will order chemistries to evaluate for creatinine status and potassium status as she is on large doses of Lasix. Review of labs last month reveals chemistries within normal limits with the exception of creatinine which was mildly elevated at 1.4. Also check a Monospot test, although this is unlikely, I will do this for thoroughness.  In the interim I will also check a sleep study to evaluate for sleep apnea causing her significant fatigue and insomnia. I am uncertain if this is the case but with her body habitus she may be having some issues with hypoventilation causing her to be so tired. I have asked her to take it easy at home and not overexert herself. She will followup with Dr. Dietrich Pates for discussion of test results and further recommendations.

## 2011-12-05 NOTE — Assessment & Plan Note (Signed)
Blood pressure is controlled. She should be able to have a lower blood pressure with her EF of 10%, however with her syncopal episodes uncertain if this would be best for her. We will try and titrate her Coreg up to 12.5 mg twice a day secondary to tachycardia to assist with fatigue associated with this and follow her blood pressure carefully. More recommendations once labs have been completed.

## 2011-12-05 NOTE — Progress Notes (Signed)
HPI: [name] is a 41 year old morbidly obese female patient of Dr. Dietrich Pates and Dr. 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 Ladona Ridgel in June of 2013. The patient has been hospitalized since ICD placement secondary to syncopal episodes. Is found that she was mildly dehydrated and given IV hydration with adjustments in her Lasix. She comes today with continued complaints of fatigue, insomnia, "just not feeling right" . She has not had any recurrent syncopal episodes. She does continue to have some lightheadedness and dizziness. She has been seen by Dr. Hadassah Pais her primary care physician with labs completed within the last month which were negative for pregnancy, normal CBC and indices, and normal chemistry. I also reviewed the labs and found that her TSH was recently completed and that was found to be within normal limits as well.    She has a history of bipolar and is on 2 medications to include Seroquel Geodon and also on Zoloft which was felt to be contributing to her lightheadedness and dizziness. She is followed by a psychiatrist, that she is due to see in Oct of this year. She has become quite frustrated with her symptoms. She has not had any ICD discharges. She tries to stay very active although she is limited due to her weight concerning walking and often uses a arise wheelchair when she is at a grocery store.  No Known Allergies  Current Outpatient Prescriptions  Medication Sig Dispense Refill  . carvedilol (COREG) 12.5 MG tablet Take 1 tablet (12.5 mg total) by mouth 2 (two) times daily with a meal.  60 tablet  3  . furosemide (LASIX) 40 MG tablet Take 1 tablet (40 mg total) by mouth 2 (two) times daily.  30 tablet  3  . HYDROcodone-acetaminophen (NORCO) 5-325 MG per tablet Take 1 tablet by mouth every 6 (six) hours as needed. For pain.      . Multiple Vitamins-Minerals (ADULT ONE DAILY GUMMIES) CHEW Chew 2 each by mouth every  morning.      . potassium chloride SA (K-DUR,KLOR-CON) 20 MEQ tablet Take 2 tablets (40 mEq total) by mouth daily.  60 tablet  12  . QUEtiapine (SEROQUEL) 300 MG tablet Take 600 mg by mouth at bedtime.        . sertraline (ZOLOFT) 100 MG tablet Take 150 mg by mouth at bedtime.       Marland Kitchen tiZANidine (ZANAFLEX) 4 MG tablet Take 4 mg by mouth every 6 (six) hours as needed. For muscle spasms      . traZODone (DESYREL) 150 MG tablet Take 150 mg by mouth at bedtime.        . ziprasidone (GEODON) 80 MG capsule Take 160 mg by mouth at bedtime.        Marland Kitchen zolpidem (AMBIEN) 10 MG tablet Take 10 mg by mouth at bedtime.        Marland Kitchen DISCONTD: carvedilol (COREG) 6.25 MG tablet Take 1 tablet (6.25 mg total) by mouth 2 (two) times daily with a meal.  60 tablet  3    Past Medical History  Diagnosis Date  . Tachycardia     Rate related LBBB - seen by Dr. Graciela Husbands and Dr. Dietrich Pates in the past  . Anxiety   . Depression   . Heart murmur     No reported valvular disease  . Bipolar 1 disorder   . Hypertension   . 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  CHF (congestive heart failure)   . Anginal pain   . History of blood transfusion 10/2010  . Arthritis     Bilateral knee; back    Past Surgical History  Procedure Date  . Cesarean section 1997  . Tubal ligation reversal 2002  . Esophagogastroduodenoscopy 11/19/2010    Procedure: ESOPHAGOGASTRODUODENOSCOPY (EGD);  Surgeon: Malissa Hippo, MD;  Location: [facility];  Service: Endoscopy;  Laterality: N/A;  . Laparotomy 11/19/2010    Procedure: EXPLORATORY LAPAROTOMY;  Surgeon: Fabio Bering;  Location: [facility];  Service: General;  Laterality: N/A;  . Gastrectomy 11/19/2010    Procedure: GASTRECTOMY;  Surgeon: Fabio Bering;  Location: [facility];  Service: General;  Laterality: N/A;  Partial Gastrectomy  . Gastrojejunostomy 11/19/2010    Procedure: GASTROJEJUNOSTOMY;  Surgeon: Fabio Bering;  Location: [facility];  Service: General;  Laterality: N/A;   Roux-En-Y  . Cardiac defibrillator placement 10/02/11  . Cholecystectomy 11/09/2010    Procedure: LAPAROSCOPIC CHOLECYSTECTOMY;  Surgeon: Fabio Bering;  Location: [facility];  Service: General;  Laterality: N/A;  . Neuroplasty / transposition median nerve at carpal tunnel bilateral ~ 2008  . Tubal ligation 1999    ZOX:WRUEAV of systems complete and found to be negative unless listed above  PHYSICAL EXAM BP 118/80  Pulse 97  Ht 5\' 5"  (1.651 m)  Wt 271 lb 12 oz (123.265 kg)  BMI 45.22 kg/m2  LMP 11/05/2011  General: Well developed, well nourished, in no acute distress Head: Eyes PERRLA, No xanthomas.   Normal cephalic and atramatic  Lungs: Clear bilaterally to auscultation and percussion. Heart: HRRR S1 S2, tachycardic, without MRG.  Pulses are 2+ & equal.            No carotid bruit. No JVD.  No abdominal bruits. No femoral bruits. Abdomen: Bowel sounds are positive, abdomen soft and non-tender without masses or                  Hernia's noted. Msk:  Back normal, normal gait. Normal strength and tone for age. Extremities: No clubbing, cyanosis or edema.  DP +1 Neuro: Alert and oriented X 3. Psych:  Good affect, responds appropriately  EKG: EKG reveals electronic ventricular pacemaker with a rate of 90 beats per minute.  ASSESSMENT AND PLAN

## 2011-12-05 NOTE — Patient Instructions (Signed)
LABS:  CMET, CBC, MONOSPOT  Your physician has recommended that you have a sleep study. This test records several body functions during sleep, including: brain activity, eye movement, oxygen and carbon dioxide blood levels, heart rate and rhythm, breathing rate and rhythm, the flow of air through your mouth and nose, snoring, body muscle movements, and chest and belly movement.  Your physician recommends that you schedule a follow-up appointment with Dr. Dietrich Pates when tests have been resulted.  Increase Coreg to 12.5mg  twice daily.

## 2011-12-06 LAB — COMPREHENSIVE METABOLIC PANEL
ALT: 12 U/L (ref 0–35)
AST: 22 U/L (ref 0–37)
Albumin: 4.1 g/dL (ref 3.5–5.2)
Alkaline Phosphatase: 100 U/L (ref 39–117)
BUN: 14 mg/dL (ref 6–23)
Creat: 1.33 mg/dL — ABNORMAL HIGH (ref 0.50–1.10)
Potassium: 4.1 mEq/L (ref 3.5–5.3)

## 2011-12-06 LAB — CBC WITH DIFFERENTIAL/PLATELET
Basophils Absolute: 0.1 10*3/uL (ref 0.0–0.1)
Basophils Relative: 1 % (ref 0–1)
HCT: 36 % (ref 36.0–46.0)
MCHC: 33.6 g/dL (ref 30.0–36.0)
Monocytes Absolute: 0.4 10*3/uL (ref 0.1–1.0)
Neutro Abs: 4.2 10*3/uL (ref 1.7–7.7)
Platelets: 255 10*3/uL (ref 150–400)
RDW: 13.8 % (ref 11.5–15.5)
WBC: 7 10*3/uL (ref 4.0–10.5)

## 2011-12-13 ENCOUNTER — Ambulatory Visit: Payer: Medicaid Other | Admitting: Adult Health

## 2011-12-25 ENCOUNTER — Ambulatory Visit: Payer: Medicaid Other | Attending: Cardiology | Admitting: Sleep Medicine

## 2011-12-25 DIAGNOSIS — G4710 Hypersomnia, unspecified: Secondary | ICD-10-CM | POA: Insufficient documentation

## 2011-12-25 DIAGNOSIS — R5381 Other malaise: Secondary | ICD-10-CM

## 2011-12-25 DIAGNOSIS — R079 Chest pain, unspecified: Secondary | ICD-10-CM

## 2011-12-25 DIAGNOSIS — I5042 Chronic combined systolic (congestive) and diastolic (congestive) heart failure: Secondary | ICD-10-CM

## 2011-12-25 DIAGNOSIS — Z6841 Body Mass Index (BMI) 40.0 and over, adult: Secondary | ICD-10-CM | POA: Insufficient documentation

## 2011-12-27 ENCOUNTER — Ambulatory Visit: Payer: Medicaid Other | Admitting: Adult Health

## 2011-12-31 NOTE — Procedures (Signed)
[facility] Makaley Storts A. Gerilyn Pilgrim, MD     [email or web address]         NAME:  Karla Anderson, Karla Anderson             ACCOUNT NO.:  [id number]  MEDICAL RECORD NO.:  [id number]          PATIENT TYPE:  OUT  LOCATION:  SLEEP LAB                     FACILITY:  [facility]  PHYSICIAN:  Lahela Woodin A. Gerilyn Pilgrim, M.D. DATE OF BIRTH:  07-26-1970  DATE OF STUDY:  12/25/2011                           NOCTURNAL POLYSOMNOGRAM  REFERRING PHYSICIAN:  Gerrit Friends. Dietrich Pates, MD, [facility]  INDICATION FOR STUDY:  41 year old present with hypersomnia, loud snoring, and witnessed apnea.  EPWORTH SLEEPINESS SCORE: 1.   BMI 45.  MEDICATIONS:  Coreg, Lasix, hydrocodone, multivitamin, potassium, Seroquel, sertraline, tizanidine, trazodone, and Ambien p.r.n. although not taken on the night of  recording and Geodon.  SLEEP ARCHITECTURE:  The total recording time is 422 minutes.  Sleep efficiency 95%.  Sleep latency 19 minutes.  REM latency 95 minutes. Stage N1 2%, N2 64%, N3 16%, and REM sleep 18%.  RESPIRATORY DATA:  Baseline oxygen saturation 98, lowest saturation 84 during REM sleep.  Diagnostic AHI is 3.2.  CARDIAC DATA:  Average heart rate is 69 with no significant dysrhythmias observed.  MOVEMENT-PARASOMNIA:  PLM index 0.6.  IMPRESSIONS-RECOMMENDATIONS:  Unremarkable nocturnal polysomnography. Given the marked hypersomnia, consider sleep consultation with further evaluation.  Thanks for this referral.     Thekla Colborn A. Gerilyn Pilgrim, M.D.    KAD/MEDQ  D:  12/31/2011 09:08:04  T:  12/31/2011 09:45:24  Job:  [id number]

## 2012-01-10 ENCOUNTER — Ambulatory Visit (INDEPENDENT_AMBULATORY_CARE_PROVIDER_SITE_OTHER): Payer: Medicaid Other | Admitting: Cardiology

## 2012-01-10 ENCOUNTER — Encounter: Payer: Self-pay | Admitting: Cardiology

## 2012-01-10 VITALS — BP 120/82 | HR 79 | Ht 65.0 in | Wt 275.0 lb

## 2012-01-10 DIAGNOSIS — F341 Dysthymic disorder: Secondary | ICD-10-CM

## 2012-01-10 DIAGNOSIS — F419 Anxiety disorder, unspecified: Secondary | ICD-10-CM

## 2012-01-10 DIAGNOSIS — I428 Other cardiomyopathies: Secondary | ICD-10-CM | POA: Insufficient documentation

## 2012-01-10 DIAGNOSIS — I10 Essential (primary) hypertension: Secondary | ICD-10-CM

## 2012-01-10 DIAGNOSIS — I429 Cardiomyopathy, unspecified: Secondary | ICD-10-CM

## 2012-01-10 DIAGNOSIS — D649 Anemia, unspecified: Secondary | ICD-10-CM

## 2012-01-10 DIAGNOSIS — F329 Major depressive disorder, single episode, unspecified: Secondary | ICD-10-CM | POA: Insufficient documentation

## 2012-01-10 DIAGNOSIS — Z9289 Personal history of other medical treatment: Secondary | ICD-10-CM | POA: Insufficient documentation

## 2012-01-10 MED ORDER — CARVEDILOL 25 MG PO TABS: 25.0000 mg | ORAL_TABLET | Freq: Two times a day (BID) | ORAL | Status: DC

## 2012-01-10 MED ORDER — SPIRONOLACTONE 25 MG PO TABS: 25.0000 mg | ORAL_TABLET | Freq: Every day | ORAL | Status: DC

## 2012-01-10 MED ORDER — LISINOPRIL 5 MG PO TABS: 5.0000 mg | ORAL_TABLET | Freq: Every day | ORAL | Status: DC

## 2012-01-10 NOTE — Progress Notes (Signed)
Patient ID: Karla Anderson, female   DOB: 08-10-1970, 41 y.o.   MRN: [id number]  HPI: Scheduled return visit for this nice woman with recently diagnosed cardiomyopathy.  She is being treated with a host of psychiatric medications and appears to have some challenges in this sphere.  She has not been restricting caloric intake nor has she been diligent about reducing salt in her diet.  She notes dyspnea with mild to moderate activity but no orthopnea nor PND.  She has had mild chronic edema.  Prior to Admission medications   Medication Sig Start Date End Date Taking? Authorizing Provider  carvedilol (COREG) 25 MG tablet Take 1 tablet (25 mg total) by mouth 2 (two) times daily with a meal. 01/10/12 01/09/13 Yes Kathlen Brunswick, MD  furosemide (LASIX) 40 MG tablet Take 1 tablet (40 mg total) by mouth 2 (two) times daily. 11/11/11 11/10/12 Yes Avon Gully, MD  HYDROcodone-acetaminophen (NORCO) 5-325 MG per tablet Take 1 tablet by mouth every 6 (six) hours as needed. For pain.   Yes Historical Provider, MD  Multiple Vitamins-Minerals (ADULT ONE DAILY GUMMIES) CHEW Chew 2 each by mouth every morning.   Yes Historical Provider, MD  QUEtiapine (SEROQUEL) 300 MG tablet Take 600 mg by mouth at bedtime.     Yes Historical Provider, MD  sertraline (ZOLOFT) 100 MG tablet Take 150 mg by mouth at bedtime.    Yes Historical Provider, MD  tiZANidine (ZANAFLEX) 4 MG tablet Take 4 mg by mouth every 6 (six) hours as needed. For muscle spasms   Yes Historical Provider, MD  traZODone (DESYREL) 150 MG tablet Take 150 mg by mouth at bedtime.     Yes Historical Provider, MD  ziprasidone (GEODON) 80 MG capsule Take 160 mg by mouth at bedtime.     Yes Historical Provider, MD  zolpidem (AMBIEN) 10 MG tablet Take 10 mg by mouth at bedtime.     Yes Historical Provider, MD  lisinopril (PRINIVIL,ZESTRIL) 5 MG tablet Take 1 tablet (5 mg total) by mouth daily. 01/10/12   Kathlen Brunswick, MD  spironolactone (ALDACTONE) 25 MG  tablet Take 1 tablet (25 mg total) by mouth daily. 01/10/12   Kathlen Brunswick, MD  No Known Allergies    Past medical history, social history, and family history reviewed and updated.  ROS: See history of present illness.  All other systems reviewed and are negative.  PHYSICAL EXAM: BP 120/82  Pulse 79  Ht 5\' 5"  (1.651 m)  Wt 124.739 kg (275 lb)  BMI 45.76 kg/m2  SpO2 98%  General-Well developed; no acute distress Body habitus-Obese Neck-No JVD; no carotid bruits Lungs-clear lung fields; resonant to percussion Cardiovascular-normal PMI; normal S1 and S2; no third heart sound Abdomen-normal bowel sounds; soft and non-tender without masses or organomegaly Musculoskeletal-No deformities, no cyanosis or clubbing Neurologic-Normal cranial nerves; symmetric strength and tone Skin-Warm, no significant lesions Extremities-distal pulses intact; 1+ edema; chronic stasis changes  ASSESSMENT AND PLAN:  [place] Bing, MD 01/10/2012 2:27 PM

## 2012-01-10 NOTE — Patient Instructions (Addendum)
Your physician recommends that you schedule a follow-up appointment in: 1 month  Your physician has recommended you make the following change in your medication:  1 - INCREASE Coreg to 25 mg twice a day 2 - START Aldactone (Spironolactone) 25 mg daily 3 - START Lisinopril 5 mg daily 4 - STOP Potassium  Low salt diet - enclosed  Your physician recommends that you return for lab work in:  3 weeks (Due November 1st)

## 2012-01-10 NOTE — Assessment & Plan Note (Signed)
Repeat CBC will be obtained.  If anemia persists, additional evaluation will be necessary and would most appropriately be directed by Dr. Felecia Shelling.

## 2012-01-10 NOTE — Assessment & Plan Note (Signed)
Patient is doing fairly well with fairly modest treatment of her severe cardiomyopathy.  TSH and ferritin levels have been normal.  An HIV antibody test will also be obtained.  She is not being treated with an ACE inhibitor.  Lisinopril 5 mg per day will be started.  Aldactone will also be added allowing [place] to discontinue potassium supplement.  Electrolytes and renal function will be carefully monitored and patient followed closely.  Dietary teaching was provided regarding salt and caloric restrictions.

## 2012-01-10 NOTE — Assessment & Plan Note (Signed)
Blood pressure is well controlled with current medications.  Additional pharmaceutical agents for treatment of cardiomyopathy will only further lower her pressure.

## 2012-01-10 NOTE — Progress Notes (Deleted)
Name: Karla Anderson    DOB: 1970/09/27  Age: 41 y.o.  MR#: [id number]       PCP:  Avon Gully, MD      Insurance: @PAYORNAME @   CC:   No chief complaint on file.  MEDICATION LIST REVIEWED  VS BP 120/82  Pulse 79  Ht 5\' 5"  (1.651 m)  Wt 275 lb (124.739 kg)  BMI 45.76 kg/m2  SpO2 98%  Weights Current Weight  01/10/12 275 lb (124.739 kg)  12/05/11 271 lb 12 oz (123.265 kg)  11/18/11 270 lb 12 oz (122.811 kg)    Blood Pressure  BP Readings from Last 3 Encounters:  01/10/12 120/82  12/05/11 118/80  11/18/11 110/70     Admit date:  (Not on file) Last encounter with RMR:  Visit date not found   Allergy No Known Allergies  Current Outpatient Prescriptions  Medication Sig Dispense Refill  . carvedilol (COREG) 12.5 MG tablet Take 1 tablet (12.5 mg total) by mouth 2 (two) times daily with a meal.  60 tablet  3  . furosemide (LASIX) 40 MG tablet Take 1 tablet (40 mg total) by mouth 2 (two) times daily.  30 tablet  3  . HYDROcodone-acetaminophen (NORCO) 5-325 MG per tablet Take 1 tablet by mouth every 6 (six) hours as needed. For pain.      . Multiple Vitamins-Minerals (ADULT ONE DAILY GUMMIES) CHEW Chew 2 each by mouth every morning.      . potassium chloride SA (K-DUR,KLOR-CON) 20 MEQ tablet Take 20 mEq by mouth 2 (two) times daily.      . QUEtiapine (SEROQUEL) 300 MG tablet Take 600 mg by mouth at bedtime.        . sertraline (ZOLOFT) 100 MG tablet Take 150 mg by mouth at bedtime.       Marland Kitchen tiZANidine (ZANAFLEX) 4 MG tablet Take 4 mg by mouth every 6 (six) hours as needed. For muscle spasms      . traZODone (DESYREL) 150 MG tablet Take 150 mg by mouth at bedtime.        . ziprasidone (GEODON) 80 MG capsule Take 160 mg by mouth at bedtime.        Marland Kitchen zolpidem (AMBIEN) 10 MG tablet Take 10 mg by mouth at bedtime.          Discontinued Meds:    Medications Discontinued During This Encounter  Medication Reason  . potassium chloride SA (K-DUR,KLOR-CON) 20 MEQ tablet      Patient Active Problem List  Diagnosis  . Morbid obesity  . Bipolar 1 disorder  . Cardiomyopathy, nonischemic  . Anxiety and depression  . Hypertension  . ICD (implantable cardiac defibrillator) in place  . Anemia, normocytic normochromic  . History of diagnostic tests    LABS Office Visit on 12/05/2011  Component Date Value  . WBC 12/05/2011 7.0   . RBC 12/05/2011 4.12   . Hemoglobin 12/05/2011 12.1   . HCT 12/05/2011 36.0   . MCV 12/05/2011 87.4   . [facility] 12/05/2011 29.4   . MCHC 12/05/2011 33.6   . RDW 12/05/2011 13.8   . Platelets 12/05/2011 255   . Neutrophils Relative 12/05/2011 60   . Neutro Abs 12/05/2011 4.2   . Lymphocytes Relative 12/05/2011 31   . Lymphs Abs 12/05/2011 2.2   . Monocytes Relative 12/05/2011 6   . Monocytes Absolute 12/05/2011 0.4   . Eosinophils Relative 12/05/2011 2   . Eosinophils Absolute 12/05/2011 0.2   . Basophils Relative 12/05/2011 1   .  Basophils Absolute 12/05/2011 0.1   . Smear Review 12/05/2011 Criteria for review not met   . Sodium 12/05/2011 140   . Potassium 12/05/2011 4.1   . Chloride 12/05/2011 104   . CO2 12/05/2011 29   . Glucose, Bld 12/05/2011 90   . BUN 12/05/2011 14   . Creat 12/05/2011 1.33*  . Total Bilirubin 12/05/2011 0.4   . Alkaline Phosphatase 12/05/2011 100   . AST 12/05/2011 22   . ALT 12/05/2011 12   . Total Protein 12/05/2011 7.1   . Albumin 12/05/2011 4.1   . Calcium 12/05/2011 9.1   . Mono Screen 12/05/2011 NEG   Admission on 11/08/2011, Discharged on 11/11/2011  Component Date Value  . WBC 11/08/2011 6.3   . RBC 11/08/2011 3.53*  . Hemoglobin 11/08/2011 10.5*  . HCT 11/08/2011 30.6*  . MCV 11/08/2011 86.7   . [facility] 11/08/2011 29.7   . MCHC 11/08/2011 34.3   . RDW 11/08/2011 14.5   . Platelets 11/08/2011 185   . Neutrophils Relative 11/08/2011 59   . Neutro Abs 11/08/2011 3.7   . Lymphocytes Relative 11/08/2011 28   . Lymphs Abs 11/08/2011 1.7   . Monocytes Relative 11/08/2011 10   .  Monocytes Absolute 11/08/2011 0.7   . Eosinophils Relative 11/08/2011 2   . Eosinophils Absolute 11/08/2011 0.2   . Basophils Relative 11/08/2011 1   . Basophils Absolute 11/08/2011 0.0   . Sodium 11/08/2011 136   . Potassium 11/08/2011 4.4   . Chloride 11/08/2011 102   . CO2 11/08/2011 26   . Glucose, Bld 11/08/2011 85   . BUN 11/08/2011 29*  . Creatinine, Ser 11/08/2011 1.91*  . Calcium 11/08/2011 9.2   . GFR calc non Af Amer 11/08/2011 32*  . GFR calc Af Amer 11/08/2011 37*  . Color, Urine 11/08/2011 YELLOW   . APPearance 11/08/2011 CLEAR   . Specific Gravity, Urine 11/08/2011 1.020   . pH 11/08/2011 6.0   . Glucose, UA 11/08/2011 NEGATIVE   . Hgb urine dipstick 11/08/2011 NEGATIVE   . Bilirubin Urine 11/08/2011 NEGATIVE   . Ketones, ur 11/08/2011 NEGATIVE   . Protein, ur 11/08/2011 NEGATIVE   . Urobilinogen, UA 11/08/2011 0.2   . Nitrite 11/08/2011 NEGATIVE   . Leukocytes, UA 11/08/2011 NEGATIVE   . Preg Test, Ur 11/08/2011 NEGATIVE   . Troponin I 11/08/2011 <0.30   . MRSA by PCR 11/08/2011 NEGATIVE   . WBC 11/08/2011 5.4   . RBC 11/08/2011 3.30*  . Hemoglobin 11/08/2011 9.7*  . HCT 11/08/2011 28.8*  . MCV 11/08/2011 87.3   . [facility] 11/08/2011 29.4   . MCHC 11/08/2011 33.7   . RDW 11/08/2011 14.2   . Platelets 11/08/2011 164   . Sodium 11/09/2011 139   . Potassium 11/09/2011 4.5   . Chloride 11/09/2011 107   . CO2 11/09/2011 26   . Glucose, Bld 11/09/2011 92   . BUN 11/09/2011 23   . Creatinine, Ser 11/09/2011 1.52*  . Calcium 11/09/2011 8.9   . GFR calc non Af Amer 11/09/2011 42*  . GFR calc Af Amer 11/09/2011 49*  . Total CK 11/08/2011 54   . CK, MB 11/08/2011 1.5   . Troponin I 11/08/2011 <0.30   . Relative Index 11/08/2011 RELATIVE INDEX IS INVALID   . Total CK 11/09/2011 55   . CK, MB 11/09/2011 1.6   . Troponin I 11/09/2011 <0.30   . Relative Index 11/09/2011 RELATIVE INDEX IS INVALID   . Pro B Natriuretic peptid* 11/08/2011  1445.0*  . Total CK  11/09/2011 56   . CK, MB 11/09/2011 1.7   . Troponin I 11/09/2011 <0.30   . Relative Index 11/09/2011 RELATIVE INDEX IS INVALID   . WBC 11/10/2011 4.7   . RBC 11/10/2011 3.21*  . Hemoglobin 11/10/2011 9.5*  . HCT 11/10/2011 28.7*  . MCV 11/10/2011 89.4   . [facility] 11/10/2011 29.6   . MCHC 11/10/2011 33.1   . RDW 11/10/2011 14.4   . Platelets 11/10/2011 162   . Sodium 11/10/2011 139   . Potassium 11/10/2011 4.4   . Chloride 11/10/2011 108   . CO2 11/10/2011 28   . Glucose, Bld 11/10/2011 88   . BUN 11/10/2011 19   . Creatinine, Ser 11/10/2011 1.46*  . Calcium 11/10/2011 8.7   . GFR calc non Af Amer 11/10/2011 44*  . GFR calc Af Amer 11/10/2011 51*  . Sodium 11/09/2011 138   . Potassium 11/09/2011 4.8   . Chloride 11/09/2011 108   . CO2 11/09/2011 26   . Glucose, Bld 11/09/2011 87   . BUN 11/09/2011 21   . Creatinine, Ser 11/09/2011 1.52*  . Calcium 11/09/2011 8.7   . GFR calc non Af Amer 11/09/2011 42*  . GFR calc Af Amer 11/09/2011 49*  . Magnesium 11/09/2011 1.8   . WBC 11/11/2011 4.2   . RBC 11/11/2011 3.32*  . Hemoglobin 11/11/2011 9.8*  . HCT 11/11/2011 29.7*  . MCV 11/11/2011 89.5   . [facility] 11/11/2011 29.5   . MCHC 11/11/2011 33.0   . RDW 11/11/2011 14.4   . Platelets 11/11/2011 172   . Vitamin B-12 11/11/2011 317   . Folate 11/11/2011 12.1   . Iron 11/11/2011 69   . TIBC 11/11/2011 342   . Saturation Ratios 11/11/2011 20   . UIBC 11/11/2011 273   . Ferritin 11/11/2011 131   . Retic Ct Pct 11/11/2011 3.1   . RBC. 11/11/2011 3.27*  . Retic Count, Manual 11/11/2011 101.4   . Pro B Natriuretic peptid* 11/11/2011 2284.0*  Clinical Support on 10/18/2011  Component Date Value  . DEVICE MODEL ICD 10/18/2011 161096   . [id number] 10/18/2011 Lewayne Bunting   M.D.   . Nira Conn 10/18/2011 N   . [id number] 10/18/2011 Lewayne Bunting   M.D.   . Sherlon Handing 10/18/2011 Lewayne Bunting   M.D.   . Sherlon Handing 10/18/2011 Lewayne Bunting   M.D.   . PACEART [facility] NOTES ICD  10/18/2011                     Value:Wound check appointment. Steri-strips removed. Wound without redness or edema. Incision edges approximated, wound well healed. Normal device function. Thresholds, sensing, and impedances consistent with implant measurements. Device programmed at 3.5V for                          extra safety margin until 3 month visit. Histogram distribution appropriate for patient and level of activity. No mode switches or ventricular arrhythmias noted. Patient educated about wound care, arm mobility, lifting restrictions, shock plan. ROV in 3                          months with implanting physician.  2 VF episodes induced @ implant.  . AL IMPEDENCE ICD 10/18/2011 682   . LV LEAD IMPEDENCE ICD 10/18/2011 500   . RV LEAD IMPEDENCE ICD 10/18/2011 533   . HV IMPEDENCE 10/18/2011 76   .  VF 10/18/2011 2   . VENTRICULAR PACING ICD 10/18/2011 100   . ATRIAL PACING ICD 10/18/2011 1   . TOT-0001 10/18/2011 2   . AL AMPLITUDE 10/18/2011 6.3   . RV LEAD AMPLITUDE 10/18/2011 20   . LV LEAD AMPLITUDE 10/18/2011 5.7   . AL THRESHOLD 10/18/2011 1.1   . RV LEAD THRESHOLD 10/18/2011 0.5   . LV LEAD THRESHOLD 10/18/2011 0.7      Results for this Opt Visit:     Results for orders placed in visit on 12/05/11  CBC WITH DIFFERENTIAL      Component Value Range   WBC 7.0  4.0 - 10.5 K/uL   RBC 4.12  3.87 - 5.11 MIL/uL   Hemoglobin 12.1  12.0 - 15.0 g/dL   HCT 96.0  45.4 - 09.8 %   MCV 87.4  78.0 - 100.0 fL   MCH 29.4  26.0 - 34.0 pg   MCHC 33.6  30.0 - 36.0 g/dL   RDW 11.9  14.7 - 82.9 %   Platelets 255  150 - 400 K/uL   Neutrophils Relative 60  43 - 77 %   Neutro Abs 4.2  1.7 - 7.7 K/uL   Lymphocytes Relative 31  12 - 46 %   Lymphs Abs 2.2  0.7 - 4.0 K/uL   Monocytes Relative 6  3 - 12 %   Monocytes Absolute 0.4  0.1 - 1.0 K/uL   Eosinophils Relative 2  0 - 5 %   Eosinophils Absolute 0.2  0.0 - 0.7 K/uL   Basophils Relative 1  0 - 1 %   Basophils Absolute 0.1  0.0 - 0.1 K/uL    Smear Review Criteria for review not met    COMPREHENSIVE METABOLIC PANEL      Component Value Range   Sodium 140  135 - 145 mEq/L   Potassium 4.1  3.5 - 5.3 mEq/L   Chloride 104  96 - 112 mEq/L   CO2 29  19 - 32 mEq/L   Glucose, Bld 90  70 - 99 mg/dL   BUN 14  6 - 23 mg/dL   Creat 5.62 (*) 1.30 - 1.10 mg/dL   Total Bilirubin 0.4  0.3 - 1.2 mg/dL   Alkaline Phosphatase 100  39 - 117 U/L   AST 22  0 - 37 U/L   ALT 12  0 - 35 U/L   Total Protein 7.1  6.0 - 8.3 g/dL   Albumin 4.1  3.5 - 5.2 g/dL   Calcium 9.1  8.4 - 86.5 mg/dL  MONONUCLEOSIS SCREEN      Component Value Range   Mono Screen NEG  NEGATIVE    EKG Orders placed in visit on 12/05/11  . EKG 12-LEAD     Prior Assessment and Plan Problem List as of 01/10/2012            Cardiology Problems   Cardiomyopathy, nonischemic   Hypertension     Other   Morbid obesity   Bipolar 1 disorder   Anxiety and depression   ICD (implantable cardiac defibrillator) in place   Anemia, normocytic normochromic   History of diagnostic tests       Imaging: No results found.   FRS Calculation: Score not calculated. Missing: Total Cholesterol

## 2012-01-13 ENCOUNTER — Encounter: Payer: Self-pay | Admitting: Cardiology

## 2012-01-15 ENCOUNTER — Encounter: Payer: Self-pay | Admitting: *Deleted

## 2012-01-20 ENCOUNTER — Encounter: Payer: Medicaid Other | Admitting: Internal Medicine

## 2012-01-23 ENCOUNTER — Encounter: Payer: Medicaid Other | Admitting: Internal Medicine

## 2012-01-27 ENCOUNTER — Other Ambulatory Visit: Payer: Self-pay | Admitting: *Deleted

## 2012-01-31 ENCOUNTER — Other Ambulatory Visit: Payer: Self-pay | Admitting: *Deleted

## 2012-01-31 DIAGNOSIS — I429 Cardiomyopathy, unspecified: Secondary | ICD-10-CM

## 2012-01-31 DIAGNOSIS — I10 Essential (primary) hypertension: Secondary | ICD-10-CM

## 2012-02-13 ENCOUNTER — Encounter: Payer: Self-pay | Admitting: *Deleted

## 2012-02-25 ENCOUNTER — Encounter: Payer: Self-pay | Admitting: *Deleted

## 2012-02-25 ENCOUNTER — Ambulatory Visit (INDEPENDENT_AMBULATORY_CARE_PROVIDER_SITE_OTHER): Payer: Medicaid Other | Admitting: Cardiology

## 2012-02-25 ENCOUNTER — Encounter: Payer: Self-pay | Admitting: Cardiology

## 2012-02-25 VITALS — BP 120/68 | HR 78 | Ht 65.0 in | Wt 290.0 lb

## 2012-02-25 DIAGNOSIS — I428 Other cardiomyopathies: Secondary | ICD-10-CM

## 2012-02-25 DIAGNOSIS — D649 Anemia, unspecified: Secondary | ICD-10-CM

## 2012-02-25 DIAGNOSIS — R5381 Other malaise: Secondary | ICD-10-CM

## 2012-02-25 DIAGNOSIS — F341 Dysthymic disorder: Secondary | ICD-10-CM

## 2012-02-25 DIAGNOSIS — R5383 Other fatigue: Secondary | ICD-10-CM

## 2012-02-25 DIAGNOSIS — I10 Essential (primary) hypertension: Secondary | ICD-10-CM

## 2012-02-25 DIAGNOSIS — F419 Anxiety disorder, unspecified: Secondary | ICD-10-CM

## 2012-02-25 DIAGNOSIS — R0602 Shortness of breath: Secondary | ICD-10-CM

## 2012-02-25 MED ORDER — FUROSEMIDE 40 MG PO TABS: 60.0000 mg | ORAL_TABLET | Freq: Two times a day (BID) | ORAL | Status: DC

## 2012-02-25 MED ORDER — LISINOPRIL 20 MG PO TABS: 20.0000 mg | ORAL_TABLET | Freq: Every day | ORAL | Status: DC

## 2012-02-25 NOTE — Assessment & Plan Note (Signed)
Blood pressure has been normal for at least the past 5 months.

## 2012-02-25 NOTE — Assessment & Plan Note (Addendum)
Substantial improvement in hemoglobin increasing from 9.8 2 months ago to 12.1 one month ago.  CBC will be reassessed.

## 2012-02-25 NOTE — Progress Notes (Deleted)
Name: Karla Anderson    DOB: 05-03-1970  Age: 41 y.o.  MR#: [id number]       PCP:  Avon Gully, MD      Insurance: @PAYORNAME @   MED LIST 290 LB AT THIS VISIT, UP FROM 275 ON 10/11 - STATES SHE IS TAKING LASIX, AS PRESCRIBED C/O SOB WITH EXERTION AND ACTIVITY LAST LAB 10/13  CC:    Chief Complaint  Patient presents with  . Shortness of Breath    VS BP 120/68  Pulse 78  Ht 5\' 5"  (1.651 m)  Wt 290 lb (131.543 kg)  BMI 48.26 kg/m2  Weights Current Weight  02/25/12 290 lb (131.543 kg)  01/10/12 275 lb (124.739 kg)  12/05/11 271 lb 12 oz (123.265 kg)    Blood Pressure  BP Readings from Last 3 Encounters:  02/25/12 120/68  01/10/12 120/82  12/05/11 118/80     Admit date:  (Not on file) Last encounter with RMR:  01/13/2012   Allergy No Known Allergies  Current Outpatient Prescriptions  Medication Sig Dispense Refill  . carvedilol (COREG) 25 MG tablet Take 1 tablet (25 mg total) by mouth 2 (two) times daily with a meal.  60 tablet  6  . furosemide (LASIX) 40 MG tablet Take 1 tablet (40 mg total) by mouth 2 (two) times daily.  30 tablet  3  . HYDROcodone-acetaminophen (NORCO) 5-325 MG per tablet Take 1 tablet by mouth every 6 (six) hours as needed. For pain.      Marland Kitchen lisinopril (PRINIVIL,ZESTRIL) 5 MG tablet Take 1 tablet (5 mg total) by mouth daily.  30 tablet  6  . Multiple Vitamins-Minerals (ADULT ONE DAILY GUMMIES) CHEW Chew 2 each by mouth every morning.      Marland Kitchen QUEtiapine (SEROQUEL) 300 MG tablet Take 600 mg by mouth at bedtime.        . sertraline (ZOLOFT) 100 MG tablet Take 150 mg by mouth at bedtime.       Marland Kitchen spironolactone (ALDACTONE) 25 MG tablet Take 1 tablet (25 mg total) by mouth daily.  30 tablet  6  . tiZANidine (ZANAFLEX) 4 MG tablet Take 4 mg by mouth every 6 (six) hours as needed. For muscle spasms      . traZODone (DESYREL) 150 MG tablet Take 150 mg by mouth at bedtime.        . ziprasidone (GEODON) 80 MG capsule Take 160 mg by mouth at bedtime.        Marland Kitchen  zolpidem (AMBIEN) 10 MG tablet Take 10 mg by mouth at bedtime.          Discontinued Meds:   There are no discontinued medications.  Patient Active Problem List  Diagnosis  . Morbid obesity  . Bipolar 1 disorder  . Cardiomyopathy, nonischemic  . Anxiety and depression  . Hypertension  . ICD-[employer]  . Anemia, normocytic normochromic  . History of diagnostic tests    LABS Office Visit on 12/05/2011  Component Date Value  . WBC 12/05/2011 7.0   . RBC 12/05/2011 4.12   . Hemoglobin 12/05/2011 12.1   . HCT 12/05/2011 36.0   . MCV 12/05/2011 87.4   . [facility] 12/05/2011 29.4   . MCHC 12/05/2011 33.6   . RDW 12/05/2011 13.8   . Platelets 12/05/2011 255   . Neutrophils Relative 12/05/2011 60   . Neutro Abs 12/05/2011 4.2   . Lymphocytes Relative 12/05/2011 31   . Lymphs Abs 12/05/2011 2.2   . Monocytes Relative 12/05/2011 6   .  Monocytes Absolute 12/05/2011 0.4   . Eosinophils Relative 12/05/2011 2   . Eosinophils Absolute 12/05/2011 0.2   . Basophils Relative 12/05/2011 1   . Basophils Absolute 12/05/2011 0.1   . Smear Review 12/05/2011 Criteria for review not met   . Sodium 12/05/2011 140   . Potassium 12/05/2011 4.1   . Chloride 12/05/2011 104   . CO2 12/05/2011 29   . Glucose, Bld 12/05/2011 90   . BUN 12/05/2011 14   . Creat 12/05/2011 1.33*  . Total Bilirubin 12/05/2011 0.4   . Alkaline Phosphatase 12/05/2011 100   . AST 12/05/2011 22   . ALT 12/05/2011 12   . Total Protein 12/05/2011 7.1   . Albumin 12/05/2011 4.1   . Calcium 12/05/2011 9.1   . Mono Screen 12/05/2011 NEG      Results for this Opt Visit:     Results for orders placed in visit on 12/05/11  CBC WITH DIFFERENTIAL      Component Value Range   WBC 7.0  4.0 - 10.5 K/uL   RBC 4.12  3.87 - 5.11 MIL/uL   Hemoglobin 12.1  12.0 - 15.0 g/dL   HCT 98.1  19.1 - 47.8 %   MCV 87.4  78.0 - 100.0 fL   MCH 29.4  26.0 - 34.0 pg   MCHC 33.6  30.0 - 36.0 g/dL   RDW 29.5  62.1 - 30.8 %   Platelets  255  150 - 400 K/uL   Neutrophils Relative 60  43 - 77 %   Neutro Abs 4.2  1.7 - 7.7 K/uL   Lymphocytes Relative 31  12 - 46 %   Lymphs Abs 2.2  0.7 - 4.0 K/uL   Monocytes Relative 6  3 - 12 %   Monocytes Absolute 0.4  0.1 - 1.0 K/uL   Eosinophils Relative 2  0 - 5 %   Eosinophils Absolute 0.2  0.0 - 0.7 K/uL   Basophils Relative 1  0 - 1 %   Basophils Absolute 0.1  0.0 - 0.1 K/uL   Smear Review Criteria for review not met    COMPREHENSIVE METABOLIC PANEL      Component Value Range   Sodium 140  135 - 145 mEq/L   Potassium 4.1  3.5 - 5.3 mEq/L   Chloride 104  96 - 112 mEq/L   CO2 29  19 - 32 mEq/L   Glucose, Bld 90  70 - 99 mg/dL   BUN 14  6 - 23 mg/dL   Creat 6.57 (*) 8.46 - 1.10 mg/dL   Total Bilirubin 0.4  0.3 - 1.2 mg/dL   Alkaline Phosphatase 100  39 - 117 U/L   AST 22  0 - 37 U/L   ALT 12  0 - 35 U/L   Total Protein 7.1  6.0 - 8.3 g/dL   Albumin 4.1  3.5 - 5.2 g/dL   Calcium 9.1  8.4 - 96.2 mg/dL  MONONUCLEOSIS SCREEN      Component Value Range   Mono Screen NEG  NEGATIVE    EKG Orders placed in visit on 12/05/11  . EKG 12-LEAD     Prior Assessment and Plan Problem List as of 02/25/2012            Cardiology Problems   Cardiomyopathy, nonischemic   Last Assessment & Plan Note   01/10/2012 Office Visit Signed 01/10/2012  2:32 PM by Kathlen Brunswick, MD    Patient is doing fairly well with fairly  modest treatment of her severe cardiomyopathy.  TSH and ferritin levels have been normal.  An HIV antibody test will also be obtained.  She is not being treated with an ACE inhibitor.  Lisinopril 5 mg per day will be started.  Aldactone will also be added allowing [place] to discontinue potassium supplement.  Electrolytes and renal function will be carefully monitored and patient followed closely.  Dietary teaching was provided regarding salt and caloric restrictions.    Hypertension   Last Assessment & Plan Note   01/10/2012 Office Visit Signed 01/10/2012  2:36 PM by Kathlen Brunswick, MD    Blood pressure is well controlled with current medications.  Additional pharmaceutical agents for treatment of cardiomyopathy will only further lower her pressure.      Other   Morbid obesity   Bipolar 1 disorder   Anxiety and depression   ICD-[employer]   Anemia, normocytic normochromic   Last Assessment & Plan Note   01/10/2012 Office Visit Signed 01/10/2012  2:30 PM by Kathlen Brunswick, MD    Repeat CBC will be obtained.  If anemia persists, additional evaluation will be necessary and would most appropriately be directed by Dr. Felecia Shelling.    History of diagnostic tests       Imaging: No results found.   FRS Calculation: Score not calculated. Missing: Total Cholesterol

## 2012-02-25 NOTE — Assessment & Plan Note (Addendum)
Clinically doing fairly well despite severely impaired left ventricular systolic function when last assessed 5 months ago.  We will continue to titrate appropriate medical therapy and to monitor electrolytes and renal function.

## 2012-02-25 NOTE — Assessment & Plan Note (Signed)
Patient claims that caloric intake is modest.  Management of obesity will be addressed when treatment of other medical problems has been optimized.

## 2012-02-25 NOTE — Progress Notes (Signed)
Patient ID: Karla Anderson, female   DOB: 1970-08-28, 41 y.o.   MRN: [id number]  HPI: Scheduled return visit for this nice young woman with cardiomyopathy.  Since her last visit, she has done fairly well.  She denies any increase in caloric intake, but has noted a significant weight gain.  She reports increased dyspnea on exertion but no orthopnea nor PND.  She neglected to return for lab tests as requested.  Prior to Admission medications   Medication Sig Start Date End Date Taking? Authorizing Provider  carvedilol (COREG) 25 MG tablet Take 1 tablet (25 mg total) by mouth 2 (two) times daily with a meal. 01/10/12 01/09/13 Yes Kathlen Brunswick, MD  furosemide (LASIX) 40 MG tablet Take 1 tablet (40 mg total) by mouth 2 (two) times daily. 11/11/11 11/10/12 Yes Avon Gully, MD  HYDROcodone-acetaminophen (NORCO) 5-325 MG per tablet Take 1 tablet by mouth every 6 (six) hours as needed. For pain.   Yes Historical Provider, MD  lisinopril (PRINIVIL,ZESTRIL) 5 MG tablet Take 1 tablet (5 mg total) by mouth daily. 01/10/12  Yes Kathlen Brunswick, MD  Multiple Vitamins-Minerals (ADULT ONE DAILY GUMMIES) CHEW Chew 2 each by mouth every morning.   Yes Historical Provider, MD  QUEtiapine (SEROQUEL) 300 MG tablet Take 600 mg by mouth at bedtime.     Yes Historical Provider, MD  sertraline (ZOLOFT) 100 MG tablet Take 150 mg by mouth at bedtime.    Yes Historical Provider, MD  spironolactone (ALDACTONE) 25 MG tablet Take 1 tablet (25 mg total) by mouth daily. 01/10/12  Yes Kathlen Brunswick, MD  tiZANidine (ZANAFLEX) 4 MG tablet Take 4 mg by mouth every 6 (six) hours as needed. For muscle spasms   Yes Historical Provider, MD  traZODone (DESYREL) 150 MG tablet Take 150 mg by mouth at bedtime.     Yes Historical Provider, MD  ziprasidone (GEODON) 80 MG capsule Take 160 mg by mouth at bedtime.     Yes Historical Provider, MD  zolpidem (AMBIEN) 10 MG tablet Take 10 mg by mouth at bedtime.     Yes Historical Provider,  MD  No Known Allergies    Past medical history, social history, and family history reviewed and updated.  ROS: Denies orthopnea, PND, palpitations, lightheadedness or syncope.  She has noted some pedal edema, but not markedly increased from baseline.  All other systems reviewed and are negative.  PHYSICAL EXAM: BP 120/68  Pulse 78  Ht 5\' 5"  (1.651 m)  Wt 131.543 kg (290 lb)  BMI 48.26 kg/m2  General-Well developed; no acute distress Body habitus-Obese Neck-Mild JVD; no carotid bruits Lungs-clear lung fields; resonant to percussion Cardiovascular-normal PMI; normal S1 and S2; S4 present; no S3 Abdomen-normal bowel sounds; soft and non-tender without masses or organomegaly Musculoskeletal-No deformities, no cyanosis or clubbing Neurologic-Normal cranial nerves; symmetric strength and tone Skin-Warm, no significant lesions; abrasion over the right side of the chin Extremities-distal pulses intact; Lower legs appear enlarged with a constriction at her sock line, but there is little if any pitting  ASSESSMENT AND PLAN:   Bing, MD 02/25/2012 1:29 PM

## 2012-02-25 NOTE — Patient Instructions (Addendum)
Your physician recommends that you schedule a follow-up appointment in: 1 month  Your physician has recommended you make the following change in your medication:  1 - INCREASE lasix to 60 mg twice a day (1 1/2 tabs) 2 - INCREASE lisinopril to 10 mg daily x 1 week, then 20 mg daily thereafter  Weigh daily and record weights.  Bring to your next office visit.  Your physician recommends that you return for lab work in: Today and in 3 weeks

## 2012-02-26 ENCOUNTER — Encounter: Payer: Self-pay | Admitting: Cardiology

## 2012-02-26 DIAGNOSIS — N182 Chronic kidney disease, stage 2 (mild): Secondary | ICD-10-CM | POA: Insufficient documentation

## 2012-02-26 LAB — COMPREHENSIVE METABOLIC PANEL
Albumin: 4.2 g/dL (ref 3.5–5.2)
CO2: 30 mEq/L (ref 19–32)
Glucose, Bld: 78 mg/dL (ref 70–99)
Potassium: 4.8 mEq/L (ref 3.5–5.3)
Sodium: 137 mEq/L (ref 135–145)
Total Protein: 7 g/dL (ref 6.0–8.3)

## 2012-02-26 LAB — HIV ANTIBODY (ROUTINE TESTING W REFLEX): HIV: NONREACTIVE

## 2012-03-26 ENCOUNTER — Ambulatory Visit: Payer: Medicaid Other | Admitting: Cardiology

## 2012-03-30 ENCOUNTER — Encounter: Payer: Self-pay | Admitting: *Deleted

## 2012-03-31 ENCOUNTER — Encounter: Payer: Self-pay | Admitting: Cardiology

## 2012-03-31 ENCOUNTER — Ambulatory Visit (INDEPENDENT_AMBULATORY_CARE_PROVIDER_SITE_OTHER): Payer: Medicaid Other | Admitting: Cardiology

## 2012-03-31 VITALS — BP 90/70 | HR 93 | Ht 65.0 in | Wt 288.0 lb

## 2012-03-31 DIAGNOSIS — I10 Essential (primary) hypertension: Secondary | ICD-10-CM

## 2012-03-31 DIAGNOSIS — Z95810 Presence of automatic (implantable) cardiac defibrillator: Secondary | ICD-10-CM

## 2012-03-31 DIAGNOSIS — I428 Other cardiomyopathies: Secondary | ICD-10-CM

## 2012-03-31 DIAGNOSIS — N182 Chronic kidney disease, stage 2 (mild): Secondary | ICD-10-CM

## 2012-03-31 MED ORDER — LISINOPRIL 10 MG PO TABS: 10.0000 mg | ORAL_TABLET | Freq: Every day | ORAL | Status: DC

## 2012-03-31 MED ORDER — FUROSEMIDE 40 MG PO TABS: 40.0000 mg | ORAL_TABLET | Freq: Two times a day (BID) | ORAL | Status: DC

## 2012-03-31 NOTE — Progress Notes (Signed)
Patient ID: Karla Anderson, female   DOB: 07-29-1970, 41 y.o.   MRN: [id number]  HPI: Scheduled return visit for this nice young woman with severe cardiomyopathy.  Over the past week or 2, she suffered a number of episodes of loss of consciousness, falling to the floor after becoming lightheaded, but fortunately not injuring herself.  She did not return as requested for followup blood tests.  She notes no dyspnea.  She believes that her defibrillator discharged 2 weeks ago, but cannot really describe that event.  Prior to Admission medications   Medication Sig Start Date End Date Taking? Authorizing Provider  carvedilol (COREG) 25 MG tablet Take 1 tablet (25 mg total) by mouth 2 (two) times daily with a meal. 01/10/12 01/09/13 Yes Kathlen Brunswick, MD  furosemide (LASIX) 40 MG tablet Take 1.5 tablets (60 mg total) by mouth 2 (two) times daily. 02/25/12 02/24/13 Yes Kathlen Brunswick, MD  HYDROcodone-acetaminophen (NORCO) 5-325 MG per tablet Take 1 tablet by mouth every 6 (six) hours as needed. For pain.   Yes Historical Provider, MD  lisinopril (PRINIVIL,ZESTRIL) 20 MG tablet Take 1 tablet (20 mg total) by mouth daily. 02/25/12  Yes Kathlen Brunswick, MD  Multiple Vitamins-Minerals (ADULT ONE DAILY GUMMIES) CHEW Chew 2 each by mouth every morning.   Yes Historical Provider, MD  QUEtiapine (SEROQUEL) 300 MG tablet Take 600 mg by mouth at bedtime.     Yes Historical Provider, MD  sertraline (ZOLOFT) 100 MG tablet Take 150 mg by mouth at bedtime.    Yes Historical Provider, MD  spironolactone (ALDACTONE) 25 MG tablet Take 1 tablet (25 mg total) by mouth daily. 01/10/12  Yes Kathlen Brunswick, MD  tiZANidine (ZANAFLEX) 4 MG tablet Take 4 mg by mouth every 6 (six) hours as needed. For muscle spasms   Yes Historical Provider, MD  traZODone (DESYREL) 150 MG tablet Take 150 mg by mouth at bedtime.     Yes Historical Provider, MD  ziprasidone (GEODON) 80 MG capsule Take 160 mg by mouth at bedtime.     Yes  Historical Provider, MD  zolpidem (AMBIEN) 10 MG tablet Take 10 mg by mouth at bedtime.     Yes Historical Provider, MD   No Known Allergies    Past medical history, social history, and family history reviewed and updated.  ROS: Denies orthopnea, PND, palpitations or edema.  All other systems reviewed and are negative.  PHYSICAL EXAM: BP 102/82  Pulse 95  Ht 5\' 5"  (1.651 m)  Wt 130.636 kg (288 lb)  BMI 47.93 kg/m2  SpO2 97%   General-Well developed; no acute distress Body habitus-Obese Neck-No JVD; no carotid bruits Lungs-clear lung fields; resonant to percussion Cardiovascular-Nonpalpable PMI; Distant S1 and S2 Abdomen-normal bowel sounds; soft and non-tender without masses or organomegaly Musculoskeletal-No deformities, no cyanosis or clubbing Neurologic-Normal cranial nerves; symmetric strength and tone Skin-Warm, no significant lesions Extremities-distal pulses intact; no edema  EKG: Normal sinus rhythm with atrial synchronous ventricular pacing.  Unchanged when compared to previous tracing performed 12/05/11.  ASSESSMENT AND PLAN:  Bradley Bing, MD 03/31/2012 2:01 PM

## 2012-03-31 NOTE — Patient Instructions (Addendum)
Your physician recommends that you schedule a follow-up appointment in:  1 - 1 month or if you have redcurrant dizzy spells 2 - ASAP to have ICD interrogated  Your physician has recommended you make the following change in your medication:  1 - DECREASE Lasix to 40 mg twice a day.  (Dont take pm dose tonight or am dose tomorrow) 2 - DECREASE Lisinopril to 10 mg daily  Your physician recommends that you return for lab work in: Today

## 2012-03-31 NOTE — Assessment & Plan Note (Signed)
Repeat assessment of renal function and electrolytes is pending.

## 2012-03-31 NOTE — Assessment & Plan Note (Signed)
Slight weight loss since her last visit.  Concerted effort at weight loss will be made once cardiac status is stable.

## 2012-03-31 NOTE — Progress Notes (Deleted)
Name: Karla Anderson    DOB: 11/15/1970  Age: 41 y.o.  MR#: [id number]       PCP:  Avon Gully, MD      Insurance: @PAYORNAME @   CC:   No chief complaint on file.  1 DID NOT COMPLETE LABS REQUESTED AT LAST OV, DESPITE 2 LETTERS. 2 MEDICATION LIST  VS BP 102/82  Pulse 95  Ht 5\' 5"  (1.651 m)  Wt 288 lb (130.636 kg)  BMI 47.93 kg/m2  SpO2 97%  Weights Current Weight  03/31/12 288 lb (130.636 kg)  02/25/12 290 lb (131.543 kg)  01/10/12 275 lb (124.739 kg)    Blood Pressure  BP Readings from Last 3 Encounters:  03/31/12 102/82  02/25/12 120/68  01/10/12 120/82     Admit date:  (Not on file) Last encounter with RMR:  02/26/2012   Allergy No Known Allergies  Current Outpatient Prescriptions  Medication Sig Dispense Refill  . carvedilol (COREG) 25 MG tablet Take 1 tablet (25 mg total) by mouth 2 (two) times daily with a meal.  60 tablet  6  . furosemide (LASIX) 40 MG tablet Take 1.5 tablets (60 mg total) by mouth 2 (two) times daily.  90 tablet  6  . HYDROcodone-acetaminophen (NORCO) 5-325 MG per tablet Take 1 tablet by mouth every 6 (six) hours as needed. For pain.      Marland Kitchen lisinopril (PRINIVIL,ZESTRIL) 20 MG tablet Take 1 tablet (20 mg total) by mouth daily.  30 tablet  6  . Multiple Vitamins-Minerals (ADULT ONE DAILY GUMMIES) CHEW Chew 2 each by mouth every morning.      Marland Kitchen QUEtiapine (SEROQUEL) 300 MG tablet Take 600 mg by mouth at bedtime.        . sertraline (ZOLOFT) 100 MG tablet Take 150 mg by mouth at bedtime.       Marland Kitchen spironolactone (ALDACTONE) 25 MG tablet Take 1 tablet (25 mg total) by mouth daily.  30 tablet  6  . tiZANidine (ZANAFLEX) 4 MG tablet Take 4 mg by mouth every 6 (six) hours as needed. For muscle spasms      . traZODone (DESYREL) 150 MG tablet Take 150 mg by mouth at bedtime.        . ziprasidone (GEODON) 80 MG capsule Take 160 mg by mouth at bedtime.        Marland Kitchen zolpidem (AMBIEN) 10 MG tablet Take 10 mg by mouth at bedtime.          Discontinued Meds:   There are no discontinued medications.  Patient Active Problem List  Diagnosis  . Morbid obesity  . Bipolar 1 disorder  . Cardiomyopathy, nonischemic  . Anxiety and depression  . Hypertension  . ICD-[employer]  . Anemia, normocytic normochromic  . History of diagnostic tests  . Chronic kidney disease, stage II (mild)    LABS Office Visit on 02/25/2012  Component Date Value  . Brain Natriuretic Peptide 02/25/2012 86.5   . Sodium 02/25/2012 137   . Potassium 02/25/2012 4.8   . Chloride 02/25/2012 100   . CO2 02/25/2012 30   . Glucose, Bld 02/25/2012 78   . BUN 02/25/2012 18   . Creat 02/25/2012 1.35*  . Total Bilirubin 02/25/2012 0.4   . Alkaline Phosphatase 02/25/2012 86   . AST 02/25/2012 18   . ALT 02/25/2012 9   . Total Protein 02/25/2012 7.0   . Albumin 02/25/2012 4.2   . Calcium 02/25/2012 8.8   . HIV 02/25/2012 NON REACTIVE  Results for this Opt Visit:     Results for orders placed in visit on 02/25/12  BRAIN NATRIURETIC PEPTIDE      Component Value Range   Brain Natriuretic Peptide 86.5  0.0 - 100.0 pg/mL  COMPREHENSIVE METABOLIC PANEL      Component Value Range   Sodium 137  135 - 145 mEq/L   Potassium 4.8  3.5 - 5.3 mEq/L   Chloride 100  96 - 112 mEq/L   CO2 30  19 - 32 mEq/L   Glucose, Bld 78  70 - 99 mg/dL   BUN 18  6 - 23 mg/dL   Creat 1.61 (*) 0.96 - 1.10 mg/dL   Total Bilirubin 0.4  0.3 - 1.2 mg/dL   Alkaline Phosphatase 86  39 - 117 U/L   AST 18  0 - 37 U/L   ALT 9  0 - 35 U/L   Total Protein 7.0  6.0 - 8.3 g/dL   Albumin 4.2  3.5 - 5.2 g/dL   Calcium 8.8  8.4 - 04.5 mg/dL  HIV ANTIBODY (ROUTINE TESTING)      Component Value Range   HIV NON REACTIVE  NON REACTIVE    EKG Orders placed in visit on 12/05/11  . EKG 12-LEAD     Prior Assessment and Plan Problem List as of 03/31/2012            Cardiology Problems   Cardiomyopathy, nonischemic   Last Assessment & Plan Note   02/25/2012 Office Visit Addendum 02/25/2012   9:49 PM by Kathlen Brunswick, MD    Clinically doing fairly well despite severely impaired left ventricular systolic function when last assessed 5 months ago.  We will continue to titrate appropriate medical therapy and to monitor electrolytes and renal function.    Hypertension   Last Assessment & Plan Note   02/25/2012 Office Visit Signed 02/25/2012  9:43 PM by Kathlen Brunswick, MD    Blood pressure has been normal for at least the past 5 months.      Other   Morbid obesity   Last Assessment & Plan Note   02/25/2012 Office Visit Signed 02/25/2012  9:44 PM by Kathlen Brunswick, MD    Patient claims that caloric intake is modest.  Management of obesity will be addressed when treatment of other medical problems has been optimized.    Bipolar 1 disorder   Anxiety and depression   ICD-[employer]   Anemia, normocytic normochromic   Last Assessment & Plan Note   02/25/2012 Office Visit Addendum 02/26/2012  9:04 AM by Kathlen Brunswick, MD    Substantial improvement in hemoglobin increasing from 9.8 2 months ago to 12.1 one month ago.  CBC will be reassessed.    History of diagnostic tests   Chronic kidney disease, stage II (mild)       Imaging: No results found.   FRS Calculation: Score not calculated. Missing: Total Cholesterol

## 2012-03-31 NOTE — Assessment & Plan Note (Addendum)
CHF appears well compensated at the present time.  Treatment regime is complex and compromised by limitations in patients intellectual capabilities.  We will continue to support compliance to the extent possible.

## 2012-03-31 NOTE — Assessment & Plan Note (Signed)
Patient has suffered recurrent syncope, perhaps related to excessive medical therapy.  She is only minimally orthostatic, but baseline supine blood pressure is relatively low resulting in a moderately low standing BP.  Dose of lisinopril will be decreased as will be her dose of furosemide.  The device will be interrogated.  We will continue to follow her closely with frequent laboratory assessments until she is stable.

## 2012-03-31 NOTE — Assessment & Plan Note (Signed)
Blood pressure elevation is certainly not an issue at present and likely will no longer be, as treatment for cardiomyopathy will adequately manage her mild hypertension as well.

## 2012-04-02 ENCOUNTER — Encounter: Payer: Self-pay | Admitting: Internal Medicine

## 2012-04-02 ENCOUNTER — Other Ambulatory Visit: Payer: Self-pay | Admitting: *Deleted

## 2012-04-02 ENCOUNTER — Ambulatory Visit (HOSPITAL_COMMUNITY)
Admission: RE | Admit: 2012-04-02 | Discharge: 2012-04-02 | Disposition: A | Discharge status: Home / Self Care | Payer: Medicaid Other | Source: Ambulatory Visit | Attending: Internal Medicine | Admitting: Internal Medicine

## 2012-04-02 ENCOUNTER — Telehealth: Payer: Self-pay | Admitting: *Deleted

## 2012-04-02 DIAGNOSIS — T82118A Breakdown (mechanical) of other cardiac electronic device, initial encounter: Secondary | ICD-10-CM

## 2012-04-02 DIAGNOSIS — Z950 Presence of cardiac pacemaker: Secondary | ICD-10-CM | POA: Insufficient documentation

## 2012-04-02 NOTE — Telephone Encounter (Signed)
Presented today for ICD interrogation, s/p multiple syncopal episodes.  Per Dr Ladona Ridgel, order obtained to have 2 view chest x ray today and follow up with him on Monday.

## 2012-04-02 NOTE — Telephone Encounter (Deleted)
Per Dr Ladona Ridgel, Chest x ray completed and reveals migration of coronary sinus lead.  Has an appt on Monday.

## 2012-04-02 NOTE — Telephone Encounter (Addendum)
Chest X ray was ordered, by Dr Ladona Ridgel, for assessment, following ICD interrogation.  Results of study sent to him for review

## 2012-04-02 NOTE — Telephone Encounter (Deleted)
Clarification to previous note.  Chest X ray was ordered by Dr Ladona Ridgel for assessment, following ICD interrogation.  Results of study sent to him for review.

## 2012-04-03 LAB — COMPREHENSIVE METABOLIC PANEL
ALT: 10 U/L (ref 0–35)
AST: 17 U/L (ref 0–37)
Alkaline Phosphatase: 100 U/L (ref 39–117)
Sodium: 134 mEq/L — ABNORMAL LOW (ref 135–145)
Total Bilirubin: 0.5 mg/dL (ref 0.3–1.2)
Total Protein: 6.3 g/dL (ref 6.0–8.3)

## 2012-04-03 LAB — CBC
MCH: 29.1 pg (ref 26.0–34.0)
MCV: 88.7 fL (ref 78.0–100.0)
Platelets: 239 10*3/uL (ref 150–400)
RDW: 14.4 % (ref 11.5–15.5)

## 2012-04-04 ENCOUNTER — Encounter: Payer: Self-pay | Admitting: Cardiology

## 2012-04-06 ENCOUNTER — Encounter: Payer: Self-pay | Admitting: *Deleted

## 2012-04-06 ENCOUNTER — Encounter: Payer: Self-pay | Admitting: Internal Medicine

## 2012-04-06 ENCOUNTER — Ambulatory Visit (INDEPENDENT_AMBULATORY_CARE_PROVIDER_SITE_OTHER): Payer: Medicaid Other | Admitting: Internal Medicine

## 2012-04-06 VITALS — BP 100/59 | HR 74 | Ht 65.0 in | Wt 288.0 lb

## 2012-04-06 DIAGNOSIS — I5022 Chronic systolic (congestive) heart failure: Secondary | ICD-10-CM | POA: Insufficient documentation

## 2012-04-06 DIAGNOSIS — T82118A Breakdown (mechanical) of other cardiac electronic device, initial encounter: Secondary | ICD-10-CM | POA: Insufficient documentation

## 2012-04-06 DIAGNOSIS — I951 Orthostatic hypotension: Secondary | ICD-10-CM | POA: Insufficient documentation

## 2012-04-06 DIAGNOSIS — I428 Other cardiomyopathies: Secondary | ICD-10-CM

## 2012-04-06 DIAGNOSIS — T82198A Other mechanical complication of other cardiac electronic device, initial encounter: Secondary | ICD-10-CM

## 2012-04-06 LAB — ICD DEVICE OBSERVATION
AL IMPEDENCE ICD: 660 Ohm
DEV-0020ICD: NEGATIVE
HV IMPEDENCE: 92 Ohm
RV LEAD THRESHOLD: 0.5 V

## 2012-04-06 MED ORDER — CARVEDILOL 12.5 MG PO TABS: 12.5000 mg | ORAL_TABLET | Freq: Two times a day (BID) | ORAL | Status: DC

## 2012-04-06 NOTE — Assessment & Plan Note (Signed)
Her husband who is with her today notes that at times her pressure is very low. I have asked the patient to reduce her dose of coreg to 12. 5 mg twice daily.

## 2012-04-06 NOTE — Progress Notes (Signed)
HPI Karla Anderson returns today for followup. She has a longstanding DCM, chronic systolic CHF and LBBB. She underwent BiV ICD implant back in July and has done well except for some orthostatic symptoms. She was back in the office last week and was oversensing atrial signal on her LV lead and CXR demonstrated that her lead had pulled back into the CS but was still pacing the LV.  No Known Allergies   Current Outpatient Prescriptions  Medication Sig Dispense Refill  . carvedilol (COREG) 25 MG tablet Take 1 tablet (25 mg total) by mouth 2 (two) times daily with a meal.  60 tablet  6  . furosemide (LASIX) 40 MG tablet Take 1 tablet (40 mg total) by mouth 2 (two) times daily.  60 tablet  6  . HYDROcodone-acetaminophen (NORCO) 5-325 MG per tablet Take 1 tablet by mouth every 6 (six) hours as needed. For pain.      . lisinopril (PRINIVIL,ZESTRIL) 10 MG tablet Take 1 tablet (10 mg total) by mouth daily.  30 tablet  6  . Multiple Vitamins-Minerals (ADULT ONE DAILY GUMMIES) CHEW Chew 2 each by mouth every morning.      . QUEtiapine (SEROQUEL) 300 MG tablet Take 600 mg by mouth at bedtime.        . sertraline (ZOLOFT) 100 MG tablet Take 150 mg by mouth at bedtime.       . spironolactone (ALDACTONE) 25 MG tablet Take 1 tablet (25 mg total) by mouth daily.  30 tablet  6  . tiZANidine (ZANAFLEX) 4 MG tablet Take 4 mg by mouth every 6 (six) hours as needed. For muscle spasms      . traZODone (DESYREL) 150 MG tablet Take 150 mg by mouth at bedtime.        . ziprasidone (GEODON) 80 MG capsule Take 160 mg by mouth at bedtime.        . zolpidem (AMBIEN) 10 MG tablet Take 10 mg by mouth at bedtime.           Past Medical History  Diagnosis Date  . Wide-complex tachycardia     Rate related LBBB - seen by Dr. Klein and Dr. Rothbart in the past  . Cardiomyopathy, nonischemic     + murmur; h/o CHF, chest pain; EF-10%  . Anxiety and depression     - sleep study in 12/2011  . Bipolar 1 disorder   . Hypertension    . ICD (implantable cardiac defibrillator) in place 10/02/11    2013; syncope postimplantation attributed to dehydration  . History of blood transfusion 10/2010  . Degenerative joint disease     Bilateral knee; back    ROS:   All systems reviewed and negative except as noted in the HPI.   Past Surgical History  Procedure Date  . Cesarean section 1997  . Tubal ligation 1999    Reversal-2002  . Esophagogastroduodenoscopy 11/19/2010    Rehman, MD  . Laparotomy 11/19/2010    Brent C Ziegler  . Gastrectomy 11/19/2010    "  . Gastrojejunostomy 11/19/2010    "  . Cardiac defibrillator placement 10/02/11  . Cholecystectomy 11/09/2010    Brent C Ziegler  . Neuroplasty / transposition median nerve at carpal tunnel bilateral ~ 2008     Family History  Problem Relation Age of Onset  . Stroke Father     Died recently, Age 60  . Heart failure Mother   . Diabetes Sister   . Pneumonia Father   . Coronary   artery disease Father   . Parkinsonism Mother      History   Social History  . Marital Status: Married    Spouse Name: N/A    Number of Children: N/A  . Years of Education: N/A   Occupational History  . Not on file.   Social History Main Topics  . Smoking status: Never Smoker   . Smokeless tobacco: Never Used  . Alcohol Use: No  . Drug Use: No  . Sexually Active: Yes    Birth Control/ Protection: None   Other Topics Concern  . Not on file   Social History Narrative  . No narrative on file     BP 100/59  Pulse 74  Ht 5' 5" (1.651 m)  Wt 288 lb (130.636 kg)  BMI 47.93 kg/m2  Physical Exam:  Morbidly obese appearing NAD HEENT: Unremarkable Neck:  No JVD, no thyromegally Lungs:  Clear with no wheezes HEART:  Regular rate rhythm, no murmurs, no rubs, no clicks Abd:  soft, positive bowel sounds, no organomegally, no rebound, no guarding Ext:  2 plus pulses, no edema, no cyanosis, no clubbing Skin:  No rashes no nodules Neuro:  CN II through XII intact, motor  grossly intact  DEVICE  Normal device function despite lead withdrawal.  See PaceArt for details.   Assess/Plan:  

## 2012-04-06 NOTE — Assessment & Plan Note (Signed)
Her symptoms are currently class 2. Will continue her current meds except for a reduction in the beta blocker.

## 2012-04-06 NOTE — Patient Instructions (Addendum)
Your physician recommends that you schedule a follow-up appointment in: After revision  Your physician has recommended you make the following change in your medication:  1 - DECREASE Coreg to 12.5 mg twice a day  See Implant instructions related to your lead revision.

## 2012-04-06 NOTE — Assessment & Plan Note (Signed)
While her device is technically working ok. She has clear dislodgement of the LV lead into the CS. If I new that the lead would stay in place I would not recommend revision. Unfortunately, it is likely to retract further. Will schedule her for LV lead revision. I have discussed the risks/benefits/goals/expectations of the procedure with the patient and she wishes to proceed.

## 2012-04-07 ENCOUNTER — Encounter (HOSPITAL_COMMUNITY): Payer: Self-pay | Admitting: Pharmacy Technician

## 2012-04-10 ENCOUNTER — Other Ambulatory Visit: Payer: Self-pay | Admitting: *Deleted

## 2012-04-10 DIAGNOSIS — I428 Other cardiomyopathies: Secondary | ICD-10-CM

## 2012-04-13 ENCOUNTER — Encounter: Payer: Medicaid Other | Admitting: Internal Medicine

## 2012-04-13 LAB — CBC
HCT: 32.6 % — ABNORMAL LOW (ref 36.0–46.0)
Hemoglobin: 10.7 g/dL — ABNORMAL LOW (ref 12.0–15.0)
MCH: 29.6 pg (ref 26.0–34.0)
MCV: 90.1 fL (ref 78.0–100.0)
Platelets: 308 10*3/uL (ref 150–400)
RBC: 3.62 MIL/uL — ABNORMAL LOW (ref 3.87–5.11)

## 2012-04-13 LAB — BASIC METABOLIC PANEL
BUN: 34 mg/dL — ABNORMAL HIGH (ref 6–23)
Chloride: 101 mEq/L (ref 96–112)
Potassium: 5.5 mEq/L — ABNORMAL HIGH (ref 3.5–5.3)

## 2012-04-14 MED ORDER — DEXTROSE 5 % IV SOLN
3.0000 g | INTRAVENOUS | Status: DC
  Filled 2012-04-14: qty 3000

## 2012-04-14 MED ORDER — SODIUM CHLORIDE 0.9 % IR SOLN
80.0000 mg | Status: DC
  Filled 2012-04-14: qty 2

## 2012-04-15 ENCOUNTER — Ambulatory Visit (HOSPITAL_COMMUNITY)
Admission: RE | Admit: 2012-04-15 | Discharge: 2012-04-16 | Disposition: A | Discharge status: Home / Self Care | Payer: Medicaid Other | Source: Ambulatory Visit | Attending: Internal Medicine | Admitting: Internal Medicine

## 2012-04-15 ENCOUNTER — Encounter (HOSPITAL_COMMUNITY)
Admission: RE | Disposition: A | Discharge status: Home / Self Care | Payer: Self-pay | Source: Ambulatory Visit | Attending: Internal Medicine

## 2012-04-15 DIAGNOSIS — F319 Bipolar disorder, unspecified: Secondary | ICD-10-CM

## 2012-04-15 DIAGNOSIS — D649 Anemia, unspecified: Secondary | ICD-10-CM

## 2012-04-15 DIAGNOSIS — I428 Other cardiomyopathies: Secondary | ICD-10-CM

## 2012-04-15 DIAGNOSIS — I509 Heart failure, unspecified: Secondary | ICD-10-CM | POA: Insufficient documentation

## 2012-04-15 DIAGNOSIS — T82190A Other mechanical complication of cardiac electrode, initial encounter: Secondary | ICD-10-CM | POA: Insufficient documentation

## 2012-04-15 DIAGNOSIS — N182 Chronic kidney disease, stage 2 (mild): Secondary | ICD-10-CM

## 2012-04-15 DIAGNOSIS — I951 Orthostatic hypotension: Secondary | ICD-10-CM

## 2012-04-15 DIAGNOSIS — E669 Obesity, unspecified: Secondary | ICD-10-CM | POA: Insufficient documentation

## 2012-04-15 DIAGNOSIS — I10 Essential (primary) hypertension: Secondary | ICD-10-CM

## 2012-04-15 DIAGNOSIS — F419 Anxiety disorder, unspecified: Secondary | ICD-10-CM

## 2012-04-15 DIAGNOSIS — Y831 Surgical operation with implant of artificial internal device as the cause of abnormal reaction of the patient, or of later complication, without mention of misadventure at the time of the procedure: Secondary | ICD-10-CM | POA: Insufficient documentation

## 2012-04-15 DIAGNOSIS — F3130 Bipolar disorder, current episode depressed, mild or moderate severity, unspecified: Secondary | ICD-10-CM | POA: Insufficient documentation

## 2012-04-15 DIAGNOSIS — Z95810 Presence of automatic (implantable) cardiac defibrillator: Secondary | ICD-10-CM

## 2012-04-15 DIAGNOSIS — F411 Generalized anxiety disorder: Secondary | ICD-10-CM | POA: Insufficient documentation

## 2012-04-15 DIAGNOSIS — I447 Left bundle-branch block, unspecified: Secondary | ICD-10-CM | POA: Insufficient documentation

## 2012-04-15 DIAGNOSIS — I5022 Chronic systolic (congestive) heart failure: Secondary | ICD-10-CM

## 2012-04-15 DIAGNOSIS — T82118A Breakdown (mechanical) of other cardiac electronic device, initial encounter: Secondary | ICD-10-CM

## 2012-04-15 DIAGNOSIS — Z9289 Personal history of other medical treatment: Secondary | ICD-10-CM

## 2012-04-15 DIAGNOSIS — Z6841 Body Mass Index (BMI) 40.0 and over, adult: Secondary | ICD-10-CM | POA: Insufficient documentation

## 2012-04-15 HISTORY — PX: LEAD REVISION: SHX5945

## 2012-04-15 LAB — PREGNANCY, URINE: Preg Test, Ur: NEGATIVE

## 2012-04-15 LAB — SURGICAL PCR SCREEN: Staphylococcus aureus: POSITIVE — AB

## 2012-04-15 SURGERY — LEAD REVISION
Anesthesia: LOCAL

## 2012-04-15 MED ORDER — MIDAZOLAM HCL 5 MG/5ML IJ SOLN
INTRAMUSCULAR | Status: AC
  Filled 2012-04-15: qty 5

## 2012-04-15 MED ORDER — MUPIROCIN 2 % EX OINT
TOPICAL_OINTMENT | Freq: Two times a day (BID) | CUTANEOUS | Status: DC
  Administered 2012-04-15: 1 via NASAL

## 2012-04-15 MED ORDER — HYDROCODONE-ACETAMINOPHEN 5-325 MG PO TABS
1.0000 | ORAL_TABLET | Freq: Four times a day (QID) | ORAL | Status: DC | PRN
  Administered 2012-04-15: 1 via ORAL
  Filled 2012-04-15: qty 1

## 2012-04-15 MED ORDER — SPIRONOLACTONE 25 MG PO TABS
25.0000 mg | ORAL_TABLET | Freq: Every day | ORAL | Status: DC
  Administered 2012-04-15: 25 mg via ORAL
  Filled 2012-04-15 (×2): qty 1

## 2012-04-15 MED ORDER — FENTANYL CITRATE 0.05 MG/ML IJ SOLN
INTRAMUSCULAR | Status: AC
  Filled 2012-04-15: qty 2

## 2012-04-15 MED ORDER — SODIUM CHLORIDE 0.9 % IJ SOLN: 3.0000 mL | Freq: Two times a day (BID) | INTRAMUSCULAR | Status: DC

## 2012-04-15 MED ORDER — QUETIAPINE FUMARATE 300 MG PO TABS
600.0000 mg | ORAL_TABLET | Freq: Every day | ORAL | Status: DC
  Administered 2012-04-15: 600 mg via ORAL
  Filled 2012-04-15 (×2): qty 2

## 2012-04-15 MED ORDER — MUPIROCIN 2 % EX OINT
1.0000 "application " | TOPICAL_OINTMENT | Freq: Two times a day (BID) | CUTANEOUS | Status: DC
  Administered 2012-04-15: 1 via NASAL
  Filled 2012-04-15: qty 22

## 2012-04-15 MED ORDER — LISINOPRIL 10 MG PO TABS
10.0000 mg | ORAL_TABLET | Freq: Every day | ORAL | Status: DC
  Administered 2012-04-15: 16:00:00 10 mg via ORAL
  Filled 2012-04-15 (×2): qty 1

## 2012-04-15 MED ORDER — HEPARIN (PORCINE) IN NACL 2-0.9 UNIT/ML-% IJ SOLN
INTRAMUSCULAR | Status: AC
  Filled 2012-04-15: qty 500

## 2012-04-15 MED ORDER — CHLORHEXIDINE GLUCONATE 4 % EX LIQD: 60.0000 mL | Freq: Once | CUTANEOUS | Status: DC

## 2012-04-15 MED ORDER — ACETAMINOPHEN 325 MG PO TABS: 325.0000 mg | ORAL_TABLET | ORAL | Status: DC | PRN

## 2012-04-15 MED ORDER — SODIUM CHLORIDE 0.9 % IV SOLN: 250.0000 mL | INTRAVENOUS | Status: DC

## 2012-04-15 MED ORDER — TRAZODONE HCL 150 MG PO TABS
150.0000 mg | ORAL_TABLET | Freq: Every day | ORAL | Status: DC
  Administered 2012-04-15: 150 mg via ORAL
  Filled 2012-04-15 (×2): qty 1

## 2012-04-15 MED ORDER — CARVEDILOL 12.5 MG PO TABS
12.5000 mg | ORAL_TABLET | Freq: Two times a day (BID) | ORAL | Status: DC
  Administered 2012-04-15 – 2012-04-16 (×2): 12.5 mg via ORAL
  Filled 2012-04-15 (×4): qty 1

## 2012-04-15 MED ORDER — CEFAZOLIN SODIUM-DEXTROSE 2-3 GM-% IV SOLR
2.0000 g | Freq: Four times a day (QID) | INTRAVENOUS | Status: AC
  Administered 2012-04-15 – 2012-04-16 (×3): 2 g via INTRAVENOUS
  Filled 2012-04-15 (×4): qty 50

## 2012-04-15 MED ORDER — ZOLPIDEM TARTRATE 5 MG PO TABS
5.0000 mg | ORAL_TABLET | Freq: Every evening | ORAL | Status: DC | PRN
  Administered 2012-04-15: 5 mg via ORAL
  Filled 2012-04-15: qty 1

## 2012-04-15 MED ORDER — ZIPRASIDONE HCL 80 MG PO CAPS
160.0000 mg | ORAL_CAPSULE | Freq: Every day | ORAL | Status: DC
Start: 2012-04-15 — End: 2012-04-16
  Administered 2012-04-15: 21:00:00 160 mg via ORAL
  Filled 2012-04-15 (×3): qty 2

## 2012-04-15 MED ORDER — LIDOCAINE HCL (PF) 1 % IJ SOLN
INTRAMUSCULAR | Status: AC
  Filled 2012-04-15: qty 60

## 2012-04-15 MED ORDER — SODIUM CHLORIDE 0.9 % IJ SOLN: 3.0000 mL | INTRAMUSCULAR | Status: DC | PRN

## 2012-04-15 MED ORDER — MUPIROCIN 2 % EX OINT
TOPICAL_OINTMENT | CUTANEOUS | Status: AC
  Filled 2012-04-15: qty 22

## 2012-04-15 MED ORDER — ONDANSETRON HCL 4 MG/2ML IJ SOLN: 4.0000 mg | Freq: Four times a day (QID) | INTRAMUSCULAR | Status: DC | PRN

## 2012-04-15 MED ORDER — SODIUM CHLORIDE 0.45 % IV SOLN
INTRAVENOUS | Status: DC
  Administered 2012-04-15: 10:00:00 via INTRAVENOUS

## 2012-04-15 MED ORDER — SERTRALINE HCL 100 MG PO TABS
100.0000 mg | ORAL_TABLET | Freq: Every day | ORAL | Status: DC
  Administered 2012-04-15: 100 mg via ORAL
  Filled 2012-04-15 (×2): qty 1

## 2012-04-15 MED ORDER — TIZANIDINE HCL 4 MG PO TABS
4.0000 mg | ORAL_TABLET | Freq: Four times a day (QID) | ORAL | Status: DC | PRN
  Filled 2012-04-15: qty 1

## 2012-04-15 MED ORDER — FUROSEMIDE 40 MG PO TABS
40.0000 mg | ORAL_TABLET | Freq: Two times a day (BID) | ORAL | Status: DC
Start: 2012-04-15 — End: 2012-04-16
  Administered 2012-04-15: 16:00:00 40 mg via ORAL
  Filled 2012-04-15 (×4): qty 1

## 2012-04-15 MED ORDER — CHLORHEXIDINE GLUCONATE CLOTH 2 % EX PADS: 6.0000 | MEDICATED_PAD | Freq: Every day | CUTANEOUS | Status: DC

## 2012-04-15 NOTE — H&P (View-Only) (Signed)
HPI Karla Anderson returns today for followup. She has a longstanding DCM, chronic systolic CHF and LBBB. She underwent BiV ICD implant back in July and has done well except for some orthostatic symptoms. She was back in the office last week and was oversensing atrial signal on her LV lead and CXR demonstrated that her lead had pulled back into the CS but was still pacing the LV.  No Known Allergies   Current Outpatient Prescriptions  Medication Sig Dispense Refill  . carvedilol (COREG) 25 MG tablet Take 1 tablet (25 mg total) by mouth 2 (two) times daily with a meal.  60 tablet  6  . furosemide (LASIX) 40 MG tablet Take 1 tablet (40 mg total) by mouth 2 (two) times daily.  60 tablet  6  . HYDROcodone-acetaminophen (NORCO) 5-325 MG per tablet Take 1 tablet by mouth every 6 (six) hours as needed. For pain.      Marland Kitchen lisinopril (PRINIVIL,ZESTRIL) 10 MG tablet Take 1 tablet (10 mg total) by mouth daily.  30 tablet  6  . Multiple Vitamins-Minerals (ADULT ONE DAILY GUMMIES) CHEW Chew 2 each by mouth every morning.      Marland Kitchen QUEtiapine (SEROQUEL) 300 MG tablet Take 600 mg by mouth at bedtime.        . sertraline (ZOLOFT) 100 MG tablet Take 150 mg by mouth at bedtime.       Marland Kitchen spironolactone (ALDACTONE) 25 MG tablet Take 1 tablet (25 mg total) by mouth daily.  30 tablet  6  . tiZANidine (ZANAFLEX) 4 MG tablet Take 4 mg by mouth every 6 (six) hours as needed. For muscle spasms      . traZODone (DESYREL) 150 MG tablet Take 150 mg by mouth at bedtime.        . ziprasidone (GEODON) 80 MG capsule Take 160 mg by mouth at bedtime.        Marland Kitchen zolpidem (AMBIEN) 10 MG tablet Take 10 mg by mouth at bedtime.           Past Medical History  Diagnosis Date  . Wide-complex tachycardia     Rate related LBBB - seen by Dr. Graciela Husbands and Dr. Dietrich Pates in the past  . Cardiomyopathy, nonischemic     + murmur; h/o CHF, chest pain; EF-10%  . Anxiety and depression     - sleep study in 12/2011  . Bipolar 1 disorder   . Hypertension    . ICD (implantable cardiac defibrillator) in place 10/02/11    2013; syncope postimplantation attributed to dehydration  . History of blood transfusion 10/2010  . Degenerative joint disease     Bilateral knee; back    ROS:   All systems reviewed and negative except as noted in the HPI.   Past Surgical History  Procedure Date  . Cesarean section 1997  . Tubal ligation 1999    Reversal-2002  . Esophagogastroduodenoscopy 11/19/2010    Rehman, MD  . Laparotomy 11/19/2010    Fabio Bering  . Gastrectomy 11/19/2010    "  . Gastrojejunostomy 11/19/2010    "  . Cardiac defibrillator placement 10/02/11  . Cholecystectomy 11/09/2010    Fabio Bering  . Neuroplasty / transposition median nerve at carpal tunnel bilateral ~ 2008     Family History  Problem Relation Age of Onset  . Stroke Father     Died recently, Age 35  . Heart failure Mother   . Diabetes Sister   . Pneumonia Father   . Coronary  artery disease Father   . Parkinsonism Mother      History   Social History  . Marital Status: Married    Spouse Name: N/A    Number of Children: N/A  . Years of Education: N/A   Occupational History  . Not on file.   Social History Main Topics  . Smoking status: Never Smoker   . Smokeless tobacco: Never Used  . Alcohol Use: No  . Drug Use: No  . Sexually Active: Yes    Birth Control/ Protection: None   Other Topics Concern  . Not on file   Social History Narrative  . No narrative on file     BP 100/59  Pulse 74  Ht 5\' 5"  (1.651 m)  Wt 288 lb (130.636 kg)  BMI 47.93 kg/m2  Physical Exam:  Morbidly obese appearing NAD HEENT: Unremarkable Neck:  No JVD, no thyromegally Lungs:  Clear with no wheezes HEART:  Regular rate rhythm, no murmurs, no rubs, no clicks Abd:  soft, positive bowel sounds, no organomegally, no rebound, no guarding Ext:  2 plus pulses, no edema, no cyanosis, no clubbing Skin:  No rashes no nodules Neuro:  CN II through XII intact, motor  grossly intact  DEVICE  Normal device function despite lead withdrawal.  See PaceArt for details.   Assess/Plan:

## 2012-04-15 NOTE — Interval H&P Note (Signed)
History and Physical Interval Note:  04/15/2012 10:13 AM  Karla Anderson  has presented today for surgery, with the diagnosis of lead migration  The various methods of treatment have been discussed with the patient and family. After consideration of risks, benefits and other options for treatment, the patient has consented to  Procedure(s) (LRB) with comments: LEAD REVISION (N/A) as a surgical intervention .  The patient's history has been reviewed, patient examined, no change in status, stable for surgery.  I have reviewed the patient's chart and labs.  Questions were answered to the patient's satisfaction.     Leonia Reeves.D.

## 2012-04-15 NOTE — Op Note (Signed)
Successful LV lead revision without immediate complication. Diaphragmatic stimulation is present at lower outputs than optimal. This procedure note dictated.  Karla Anderson.D.

## 2012-04-16 ENCOUNTER — Ambulatory Visit (HOSPITAL_COMMUNITY): Payer: Medicaid Other

## 2012-04-16 ENCOUNTER — Encounter (HOSPITAL_COMMUNITY): Payer: Self-pay | Admitting: *Deleted

## 2012-04-16 MED ORDER — YOU HAVE A PACEMAKER BOOK
Freq: Once | Status: AC
  Administered 2012-04-16: 05:00:00
  Filled 2012-04-16: qty 1

## 2012-04-16 NOTE — Progress Notes (Signed)
Utilization Review Completed Nikkita Adeyemi J. Safire Gordin, RN, BSN, NCM [phone]  

## 2012-04-16 NOTE — Discharge Summary (Signed)
ELECTROPHYSIOLOGY PROCEDURE DISCHARGE SUMMARY    Patient ID: Karla Anderson,  MRN: [id number], DOB/AGE: 41-Oct-1972 41 y.o.  Admit date: 04/15/2012 Discharge date: 04/16/2012  Primary Care Physician: Avon Gully, MD Primary Cardiologist: [place] Bing, MD Electrophysiologist: Lewayne Bunting, MD  Primary Discharge Diagnosis:  LV lead dislodgement s/p revision this admission  Secondary Discharge Diagnosis:  1.  Non ischemic cardiomyopathy s/p CRTD implant in 2013 2.  Bipolar disorder 3.  Hypertension 4.  Anxiety and depression 5.  Obesity  Procedures This Admission:  1.  LV lead revision on 04-15-2012 by Dr Ladona Ridgel.  The patient's previously implanted LV lead had pulled back into the CS.  That lead was removed and a new [id number] lead was placed.  This was attached to the previously implanted [employer] ICD.  There were no early apparent complications.  2.  CXR on 04-16-2012 demonstrated no pneumothorax status post LV lead revision  Brief HPI: Karla Anderson is a 42 year old female with non-ischemic cardiomyopathy status post CRTD implant.  Her LV lead was found to have been dislodged at follow up.  Risks, benefits, and alternatives to LV lead revision were reviewed with the patient who wished to proceed.   Hospital Course:  The patient was admitted and underwent LV lead revision with details as outlined above.   She was monitored on telemetry overnight which demonstrated sinus rhythm with ventricular pacing.  Left chest was without hematoma or ecchymosis.  The device was interrogated and found to be functioning normally.  CXR was obtained and demonstrated no pneumothorax status post device implantation.  Wound care, arm mobility, and restrictions were reviewed with the patient.  Dr Ladona Ridgel examined the patient and considered them stable for discharge to home.    Discharge Vitals: Blood pressure 85/52, pulse 84, temperature 98.3 F (36.8 C), temperature source Oral, resp. rate 15,  height 5\' 5"  (1.651 m), weight 288 lb (130.636 kg), last menstrual period 11/05/2011, SpO2 97.00%.    Labs:   Lab Results  Component Value Date   WBC 7.7 04/10/2012   HGB 10.7* 04/10/2012   HCT 32.6* 04/10/2012   MCV 90.1 04/10/2012   PLT 308 04/10/2012    Lab 04/10/12 1639  NA 137  K 5.5*  CL 101  CO2 28  BUN 34*  CREATININE 2.15*  CALCIUM 9.2  PROT --  BILITOT --  ALKPHOS --  ALT --  AST --  GLUCOSE 82     Discharge Medications:    Medication List     As of 04/16/2012  9:10 AM    TAKE these medications         ADULT ONE DAILY GUMMIES Chew   Chew 2 each by mouth every morning.      carvedilol 12.5 MG tablet   Commonly known as: COREG   Take 12.5 mg by mouth 2 (two) times daily with a meal.      furosemide 40 MG tablet   Commonly known as: LASIX   Take 40 mg by mouth 2 (two) times daily.      HYDROcodone-acetaminophen 5-325 MG per tablet   Commonly known as: NORCO/VICODIN   Take 1 tablet by mouth every 6 (six) hours as needed. For pain.      lisinopril 10 MG tablet   Commonly known as: PRINIVIL,ZESTRIL   Take 10 mg by mouth daily.      QUEtiapine 300 MG tablet   Commonly known as: SEROQUEL   Take 600 mg by mouth at bedtime.  sertraline 100 MG tablet   Commonly known as: ZOLOFT   Take 100 mg by mouth daily.      spironolactone 25 MG tablet   Commonly known as: ALDACTONE   Take 1 tablet (25 mg total) by mouth daily.      tiZANidine 4 MG tablet   Commonly known as: ZANAFLEX   Take 4 mg by mouth every 6 (six) hours as needed. For muscle spasms      traZODone 150 MG tablet   Commonly known as: DESYREL   Take 150 mg by mouth at bedtime.      ziprasidone 80 MG capsule   Commonly known as: GEODON   Take 160 mg by mouth at bedtime.      zolpidem 10 MG tablet   Commonly known as: AMBIEN   Take 10 mg by mouth at bedtime as needed. For sleep        Disposition:      Discharge Orders    Future Appointments: Provider: Department: Dept Phone:  Center:   04/27/2012 2:30 PM [facility] [employer] [facility] Main Office [place]) [phone] [facility]   05/08/2012 1:00 PM Kathlen Brunswick, MD [facility] [place] [phone] [id number]     Future Orders Please Complete By Expires   Diet - low sodium heart healthy      Increase activity slowly      Discharge instructions      Comments:   Please see post procedure/device implant/lead revision discharge instructions     Follow-up Information    Follow up with [facility] On 04/27/2012. (At 2:30 PM for wound check)    Contact information:   [facility] office [address] [place] [address] [phone]          Duration of Discharge Encounter: Greater than 30 minutes including physician time.  Signed, Gypsy Balsam, RN, BSN 04/16/2012, 9:10 AM

## 2012-04-16 NOTE — Progress Notes (Signed)
   ELECTROPHYSIOLOGY ROUNDING NOTE    Patient Name: Karla Anderson Date of Encounter: 04-16-2012    SUBJECTIVE:Patient feels well.  No chest pain or shortness of breath.  S/p LV lead revision 04-15-2012.  Minimal incisional soreness.   TELEMETRY: Reviewed telemetry pt in sinus rhythm with ventricular pacing Filed Vitals:   04/15/12 1800 04/15/12 1959 04/16/12 0014 04/16/12 0605  BP: 100/55 88/43 101/35 85/52  Pulse:  90 91 84  Temp:  98.3 F (36.8 C) 98.4 F (36.9 C) 98.3 F (36.8 C)  TempSrc:  Oral Oral Oral  Resp: 18 19 19 15   Height:      Weight:      SpO2:  98% 96% 97%    Intake/Output Summary (Last 24 hours) at 04/16/12 0700 Last data filed at 04/15/12 1911  Gross per 24 hour  Intake    170 ml  Output    700 ml  Net   -530 ml    Radiology/Studies:  CXR- pending  PHYSICAL EXAM Left chest without hematoma or ecchymosis  DEVICE INTERROGATION: Device interrogation pending.   Wound care, arm mobility, restrictions reviewed with patient.  Routine follow up scheduled.   A/P 1. S/p LV lead revision. CXR shows good position. Will await interogation. No complaints of diaphragmatic stimulation. Hopefully discharge later this a.m.

## 2012-04-16 NOTE — Op Note (Signed)
[name], DONE NO.:  [id number]  MEDICAL RECORD NO.:  [id number]  LOCATION:  6531                         FACILITY:  [facility]  PHYSICIAN:  Karla Canning. Ladona Ridgel, MD    DATE OF BIRTH:  [birth year]  DATE OF PROCEDURE:  04/15/2012 DATE OF DISCHARGE:                              OPERATIVE REPORT   PROCEDURE PERFORMED:  Revision of a left ventricular pacing lead.  INTRODUCTION:  The patient is a 42 year old woman with a nonischemic cardiomyopathy, left bundle-branch block and class III heart failure. She has morbid obesity, and after initial LV lead was placed approximately 6 months ago, she was found to have dislodgement of her left ventricular lead on chest x-ray with the lead dislodged into the coronary sinus.  She is now referred for revision of her left ventricular lead.  PROCEDURE:  After informed consent was obtained, the patient was taken to the Diagnostic EP Lab in the fasting state.  After usual preparation and draping, intravenous fentanyl and midazolam was given for sedation. A 30 mL of lidocaine was infiltrated over the left infraclavicular region and a 7-cm incision was carried out over this region. Electrocautery was utilized to dissect down to the fascial plane.  The ICD pocket was entered and the generator was removed.  The leads were freed up from the dense fibrous scar tissue without difficulty.  The left ventricular lead was disconnected from the defibrillator and freed up from its silk suture.  The Mailman 0.014 guidewire was advanced through the LV lead and into the coronary sinus.  The LV lead was withdrawn.  The Medtronic MB2 guiding catheter was then advanced over the 0.014 angioplasty guidewire and into the coronary sinus.  Venography of the coronary sinus was carried out.  There was a nice lateral vein that was small in diameter and size.  After the [facility] Scientific lead had dislodged from previous implanted, it was deemed most appropriate  to place a Medtronic, model 4196 88-cm bipolar lead, serial number [id number] into the vein.  The vein was subselectively cannulated with an angioplasty guidewire and the lead advanced approximately halfway from base to apex.  Multiple attempts to try to advance the lead further were unsuccessful.  This ultimately resulted in dislodgement of the lead.  The vein was recannulated with significant difficulty and the attempts to advance the Medtronic lead once this was carried out were unsuccessful.  Venography demonstrated that the vein had spasms severely, in fact there was no contrast in the vein once it had spasm. At this point, the high lateral vein was chosen for LV lead placement. It was cannulated with modest difficulty, and with modest difficulty, the Medtronic 4196 lead was advanced into the high lateral vein.  This unfortunately demonstrated diaphragmatic stimulation in all vectors down to approximately 1.5 V.  At the final site, the bipolar pacing threshold was 0.8 V at a rate of 1 millisecond and diaphragmatic stimulation threshold was 2 V at 1 millisecond.  The lead was reprogrammed to 1 V at 0.8 milliseconds.  At this point, the lead was liberated from the guiding catheter in the usual manner.  The leads were secured to the subpectoral fascia with a figure-of-eight  silk suture and the sewing sleeve was secured with silk suture.  The pocket was irrigated with antibiotic.  The new LV lead was attached to the defibrillator and the rest of the leads without particular difficulty and leads were placed back in the subcutaneous pocket.  The pocket was again irrigated with antibiotic irrigation and the incision was closed with 2-0 and 3-0 Vicryl.  Benzoin and Steri-Strips were painted on the skin, pressure dressing was applied, the patient was returned to her room in satisfactory condition.  COMPLICATIONS:  There were no immediate procedure complications.  RESULTS:  This  demonstrates successful RV lead revision in a patient who had dislodgement of her initial LV lead.     Karla Canning. Ladona Ridgel, MD     GWT/MEDQ  D:  04/15/2012  T:  04/16/2012  Job:  [id number]

## 2012-04-20 ENCOUNTER — Telehealth: Payer: Self-pay

## 2012-04-20 NOTE — Telephone Encounter (Signed)
**Note De-Identified  Obfuscation** Transition of care management call.  Pt had LV lead revision on 04/15/12 with Dr. Ladona Ridgel and was d/c'd from hosp on 04/16/12. Pt states that she passed out once yesterday and then again today. She c/o being dizzy and having a pounding HB. She states she has and is taking all of her medications as directed. Pt checked BP while we were on the phone and her BP was 86/57 and her HR was 87. Pt is advised to come to office tomorrow morning for a device check, she states she will be here at 9 am. Baxter Hire, in EP, aware. Also pt is advised to call 911 and go to ER for worsening symptoms, she verbalized understanding.   Pt is aware of her wound check appt scheduled for 04/27/12 and eph f/u with Dr. Dietrich Pates on 05/08/12.

## 2012-04-21 ENCOUNTER — Ambulatory Visit (INDEPENDENT_AMBULATORY_CARE_PROVIDER_SITE_OTHER): Payer: Medicaid Other | Admitting: *Deleted

## 2012-04-21 DIAGNOSIS — T82118A Breakdown (mechanical) of other cardiac electronic device, initial encounter: Secondary | ICD-10-CM

## 2012-04-21 DIAGNOSIS — I428 Other cardiomyopathies: Secondary | ICD-10-CM

## 2012-04-21 LAB — ICD DEVICE OBSERVATION
ATRIAL PACING ICD: 1 pct
DEVICE MODEL ICD: 106874
HV IMPEDENCE: 74 Ohm
LV LEAD AMPLITUDE: 25 mv
LV LEAD IMPEDENCE ICD: 1352 Ohm
LV LEAD THRESHOLD: 0.5 V
VENTRICULAR PACING ICD: 100 pct

## 2012-04-21 NOTE — Progress Notes (Signed)
Wound check-ICD 

## 2012-04-27 ENCOUNTER — Ambulatory Visit: Payer: Medicaid Other

## 2012-04-28 ENCOUNTER — Ambulatory Visit: Payer: Medicaid Other | Admitting: Adult Health

## 2012-04-28 ENCOUNTER — Emergency Department (HOSPITAL_COMMUNITY): Payer: Medicaid Other

## 2012-04-28 ENCOUNTER — Encounter (HOSPITAL_COMMUNITY): Payer: Self-pay | Admitting: *Deleted

## 2012-04-28 ENCOUNTER — Emergency Department (HOSPITAL_COMMUNITY)
Admission: EM | Admit: 2012-04-28 | Discharge: 2012-04-28 | Disposition: A | Discharge status: Home / Self Care | Payer: Medicaid Other | Attending: Emergency Medicine | Admitting: Emergency Medicine

## 2012-04-28 DIAGNOSIS — Z79899 Other long term (current) drug therapy: Secondary | ICD-10-CM | POA: Insufficient documentation

## 2012-04-28 DIAGNOSIS — Z8679 Personal history of other diseases of the circulatory system: Secondary | ICD-10-CM | POA: Insufficient documentation

## 2012-04-28 DIAGNOSIS — R296 Repeated falls: Secondary | ICD-10-CM | POA: Insufficient documentation

## 2012-04-28 DIAGNOSIS — R404 Transient alteration of awareness: Secondary | ICD-10-CM | POA: Insufficient documentation

## 2012-04-28 DIAGNOSIS — S93609A Unspecified sprain of unspecified foot, initial encounter: Secondary | ICD-10-CM | POA: Insufficient documentation

## 2012-04-28 DIAGNOSIS — F411 Generalized anxiety disorder: Secondary | ICD-10-CM | POA: Insufficient documentation

## 2012-04-28 DIAGNOSIS — F319 Bipolar disorder, unspecified: Secondary | ICD-10-CM | POA: Insufficient documentation

## 2012-04-28 DIAGNOSIS — F329 Major depressive disorder, single episode, unspecified: Secondary | ICD-10-CM | POA: Insufficient documentation

## 2012-04-28 DIAGNOSIS — F3289 Other specified depressive episodes: Secondary | ICD-10-CM | POA: Insufficient documentation

## 2012-04-28 DIAGNOSIS — R42 Dizziness and giddiness: Secondary | ICD-10-CM | POA: Insufficient documentation

## 2012-04-28 DIAGNOSIS — R55 Syncope and collapse: Secondary | ICD-10-CM | POA: Insufficient documentation

## 2012-04-28 DIAGNOSIS — I10 Essential (primary) hypertension: Secondary | ICD-10-CM | POA: Insufficient documentation

## 2012-04-28 DIAGNOSIS — Y939 Activity, unspecified: Secondary | ICD-10-CM | POA: Insufficient documentation

## 2012-04-28 DIAGNOSIS — Z8739 Personal history of other diseases of the musculoskeletal system and connective tissue: Secondary | ICD-10-CM | POA: Insufficient documentation

## 2012-04-28 DIAGNOSIS — Y929 Unspecified place or not applicable: Secondary | ICD-10-CM | POA: Insufficient documentation

## 2012-04-28 NOTE — ED Provider Notes (Signed)
History    This chart was scribed for Karla Lennert, MD, MD by Smitty Pluck, ED Scribe. The patient was seen in room [facility]12/[facility]12 and the patient's care was started at 1:06 PM.   CSN: [id number]  Arrival date & time 04/28/12  1203     Chief Complaint  Patient presents with  . Loss of Consciousness    Patient is a 42 y.o. female presenting with syncope. The history is provided by the patient. No language interpreter was used.  Loss of Consciousness This is a recurrent problem. The current episode started 2 days ago. The problem occurs rarely. The problem has been resolved. Pertinent negatives include no chest pain, no abdominal pain and no headaches. The symptoms are aggravated by walking. Nothing relieves the symptoms.   Karla Anderson is a 42 y.o. female who presents to the Emergency Department complaining of LOC 2 days ago causing constant, moderate left foot pain. Pt reports that she had dizziness before she had LOC. She fell during LOC causing her to have left foot pain. She reports history of syncope that she has been evaluated for but doctor does not know the cause. Pt denies any other pain.   Past Medical History  Diagnosis Date  . Wide-complex tachycardia     Rate related LBBB - seen by Dr. Graciela Husbands and Dr. Dietrich Pates in the past  . Cardiomyopathy, nonischemic     + murmur; h/o CHF, chest pain; EF-10%  . Anxiety and depression     - sleep study in 12/2011  . Bipolar 1 disorder   . Hypertension   . ICD (implantable cardiac defibrillator) in place 10/02/11    2013; syncope postimplantation attributed to dehydration  . History of blood transfusion 10/2010  . Degenerative joint disease     Bilateral knee; back    Past Surgical History  Procedure Date  . Cesarean section 1997  . Tubal ligation 1999    Reversal-2002  . Esophagogastroduodenoscopy 11/19/2010    Rehman, MD  . Laparotomy 11/19/2010    Fabio Bering  . Gastrectomy 11/19/2010    "  . Gastrojejunostomy 11/19/2010      "  . Cardiac defibrillator placement 10/02/11  . Cholecystectomy 11/09/2010    Fabio Bering  . Neuroplasty / transposition median nerve at carpal tunnel bilateral ~ 2008    Family History  Problem Relation Age of Onset  . Stroke Father     Died recently, Age 48  . Heart failure Mother   . Diabetes Sister   . Pneumonia Father   . Coronary artery disease Father   . Parkinsonism Mother     History  Substance Use Topics  . Smoking status: Never Smoker   . Smokeless tobacco: Never Used  . Alcohol Use: No    OB History    Grav Para Term Preterm Abortions TAB SAB Ect Mult Living                  Review of Systems  Constitutional: Negative for fatigue.  HENT: Negative for congestion, sinus pressure and ear discharge.   Eyes: Negative for discharge.  Respiratory: Negative for cough.   Cardiovascular: Positive for syncope. Negative for chest pain.  Gastrointestinal: Negative for abdominal pain and diarrhea.  Genitourinary: Negative for frequency and hematuria.  Musculoskeletal: Negative for back pain.  Skin: Negative for rash.  Neurological: Positive for dizziness and syncope. Negative for seizures and headaches.  Hematological: Negative.   Psychiatric/Behavioral: Negative for hallucinations.  All  other systems reviewed and are negative.    Allergies  Review of patient's allergies indicates no known allergies.  Home Medications   Current Outpatient Rx  Name  Route  Sig  Dispense  Refill  . CARVEDILOL 12.5 MG PO TABS   Oral   Take 12.5 mg by mouth 2 (two) times daily with a meal.         . FUROSEMIDE 40 MG PO TABS   Oral   Take 40 mg by mouth 2 (two) times daily.         Marland Kitchen HYDROCODONE-ACETAMINOPHEN 5-325 MG PO TABS   Oral   Take 1 tablet by mouth every 6 (six) hours as needed. For pain.         Marland Kitchen LISINOPRIL 10 MG PO TABS   Oral   Take 10 mg by mouth daily.         . ADULT ONE DAILY GUMMIES PO CHEW   Oral   Chew 2 each by mouth every morning.          Marland Kitchen QUETIAPINE FUMARATE 300 MG PO TABS   Oral   Take 600 mg by mouth at bedtime.           . SERTRALINE HCL 100 MG PO TABS   Oral   Take 100 mg by mouth daily.         Marland Kitchen SPIRONOLACTONE 25 MG PO TABS   Oral   Take 1 tablet (25 mg total) by mouth daily.   30 tablet   6   . TIZANIDINE HCL 4 MG PO TABS   Oral   Take 4 mg by mouth every 6 (six) hours as needed. For muscle spasms         . TRAZODONE HCL 150 MG PO TABS   Oral   Take 150 mg by mouth at bedtime.           Marland Kitchen ZIPRASIDONE HCL 80 MG PO CAPS   Oral   Take 160 mg by mouth at bedtime.           Marland Kitchen ZOLPIDEM TARTRATE 10 MG PO TABS   Oral   Take 10 mg by mouth at bedtime as needed. For sleep           BP 80/30  Pulse 57  Temp 97.2 F (36.2 C) (Oral)  Resp 20  Ht 5\' 5"  (1.651 m)  Wt 288 lb (130.636 kg)  BMI 47.93 kg/m2  SpO2 100%  LMP 11/05/2011  Physical Exam  Nursing note and vitals reviewed. Constitutional: She is oriented to person, place, and time. She appears well-developed.  HENT:  Head: Normocephalic.  Eyes: Conjunctivae normal are normal.  Neck: No tracheal deviation present.  Cardiovascular:  No murmur heard. Musculoskeletal: Normal range of motion. She exhibits no edema.       Anterior and medial left foot tenderness   Neurological: She is oriented to person, place, and time.  Skin: Skin is warm.  Psychiatric: She has a normal mood and affect.    ED Course  Procedures (including critical care time) DIAGNOSTIC STUDIES: Oxygen Saturation is 100% on room air, normal by my interpretation.    COORDINATION OF CARE: 1:10 PM Discussed ED treatment with pt and pt agrees.     Labs Reviewed - No data to display Dg Foot Complete Left  04/28/2012  *RADIOLOGY REPORT*  Clinical Data: Right foot pain at base of big toe and medial foot  LEFT FOOT - COMPLETE 3+ VIEW  Comparison: None.  Findings: No acute fracture, malalignment or focal soft tissue swelling.  Incompletely imaged surgical  changes of prior ORIF of a healed distal fibular fracture with lateral buttress plate and screw construct.  Bony mineralization is within normal limits.  No aggressive appearing lytic or blastic osseous lesion.  Incidental note is made of an os tibialis externa.  IMPRESSION: No acute fracture or malalignment.   Original Report Authenticated By: Malachy Moan, M.D.      No diagnosis found.    MDM        The chart was scribed for me under my direct supervision.  I personally performed the history, physical, and medical decision making and all procedures in the evaluation of this patient.Karla Lennert, MD 04/28/12 1435

## 2012-04-28 NOTE — ED Notes (Signed)
Had syncopal episode on Sunday night, Here because of pain in lt foot when fell.  Pt says she has had episode of syncope in past and does not know the cause.  Alert.

## 2012-04-30 ENCOUNTER — Ambulatory Visit: Payer: Medicaid Other | Admitting: Adult Health

## 2012-05-01 ENCOUNTER — Encounter: Payer: Self-pay | Admitting: Internal Medicine

## 2012-05-01 ENCOUNTER — Ambulatory Visit: Payer: Medicaid Other

## 2012-05-01 ENCOUNTER — Ambulatory Visit (INDEPENDENT_AMBULATORY_CARE_PROVIDER_SITE_OTHER): Payer: Medicaid Other | Admitting: Internal Medicine

## 2012-05-01 VITALS — BP 122/76 | HR 87 | Ht 65.0 in | Wt 288.1 lb

## 2012-05-01 DIAGNOSIS — Z95810 Presence of automatic (implantable) cardiac defibrillator: Secondary | ICD-10-CM

## 2012-05-01 DIAGNOSIS — I951 Orthostatic hypotension: Secondary | ICD-10-CM

## 2012-05-01 DIAGNOSIS — I5022 Chronic systolic (congestive) heart failure: Secondary | ICD-10-CM

## 2012-05-01 DIAGNOSIS — I428 Other cardiomyopathies: Secondary | ICD-10-CM

## 2012-05-01 LAB — ICD DEVICE OBSERVATION
BRDY-0002LV: 40 {beats}/min
BRDY-0003LV: 130 {beats}/min
DEV-0020ICD: NEGATIVE
DEVICE MODEL ICD: 106874
VENTRICULAR PACING ICD: 100 pct

## 2012-05-01 MED ORDER — CARVEDILOL 6.25 MG PO TABS: 6.2500 mg | ORAL_TABLET | Freq: Two times a day (BID) | ORAL | Status: DC

## 2012-05-01 MED ORDER — LISINOPRIL 5 MG PO TABS: 5.0000 mg | ORAL_TABLET | Freq: Every day | ORAL | Status: DC

## 2012-05-01 MED ORDER — FUROSEMIDE 40 MG PO TABS: 40.0000 mg | ORAL_TABLET | Freq: Every day | ORAL | Status: DC

## 2012-05-01 NOTE — Patient Instructions (Addendum)
Your physician recommends that you schedule a follow-up appointment in: 2 months with Dr Ladona Ridgel  Your physician has recommended you make the following change in your medication:  1 - CHANGE Coreg to 6.25 mg twice a day 2 - CHANGE Lisinopril to 5 mg daily 3 - CHANGE Lasix to 40 mg once a day only

## 2012-05-01 NOTE — Assessment & Plan Note (Signed)
Her syncopal episode appears to be due to orthostasis. I've asked that she reduce her dose of carvedilol to 6.25 mg twice daily and her dose of furosemide to 40 mg daily. In addition, I've asked the patient to reduce her lisinopril to 5 mg daily. Hopefully these changes will reduce the likelihood of recurrent syncope.

## 2012-05-01 NOTE — Progress Notes (Signed)
HPI Karla Anderson returns today for followup. She is a 42 year old woman with a nonischemic cardiomyopathy, left bundle branch block, chronic systolic heart failure, and syncope. The patient had dislodgment of her left ventricular pacing lead and underwent lead revision several weeks ago. Approximately one week ago, she was on the ground and trying to stand up when she passed out. She was unconscious for just a few seconds. She denies any ICD shocks, chest pain, or worsening shortness of breath. She does have chronic peripheral edema worse on the left than on the right. I suspect she may also have lymphedema. No Known Allergies   Current Outpatient Prescriptions  Medication Sig Dispense Refill  . carvedilol (COREG) 6.25 MG tablet Take 1 tablet (6.25 mg total) by mouth 2 (two) times daily with a meal.  60 tablet  6  . furosemide (LASIX) 40 MG tablet Take 1 tablet (40 mg total) by mouth daily.  30 tablet  6  . HYDROcodone-acetaminophen (NORCO) 5-325 MG per tablet Take 1 tablet by mouth every 6 (six) hours as needed. For pain.      Marland Kitchen lisinopril (PRINIVIL,ZESTRIL) 5 MG tablet Take 1 tablet (5 mg total) by mouth daily.  30 tablet  6  . Multiple Vitamins-Minerals (ADULT ONE DAILY GUMMIES) CHEW Chew 2 each by mouth every morning.      Marland Kitchen QUEtiapine (SEROQUEL) 300 MG tablet Take 600 mg by mouth at bedtime.        . sertraline (ZOLOFT) 100 MG tablet Take 100 mg by mouth daily.      Marland Kitchen spironolactone (ALDACTONE) 25 MG tablet Take 1 tablet (25 mg total) by mouth daily.  30 tablet  6  . tiZANidine (ZANAFLEX) 4 MG tablet Take 4 mg by mouth every 6 (six) hours as needed. For muscle spasms      . traZODone (DESYREL) 150 MG tablet Take 150 mg by mouth at bedtime.        . ziprasidone (GEODON) 80 MG capsule Take 160 mg by mouth at bedtime.        Marland Kitchen zolpidem (AMBIEN) 10 MG tablet Take 10 mg by mouth at bedtime as needed. For sleep         Past Medical History  Diagnosis Date  . Wide-complex tachycardia     Rate  related LBBB - seen by Dr. Graciela Husbands and Dr. Dietrich Pates in the past  . Cardiomyopathy, nonischemic     + murmur; h/o CHF, chest pain; EF-10%  . Anxiety and depression     - sleep study in 12/2011  . Bipolar 1 disorder   . Hypertension   . ICD (implantable cardiac defibrillator) in place 10/02/11    2013; syncope postimplantation attributed to dehydration  . History of blood transfusion 10/2010  . Degenerative joint disease     Bilateral knee; back    ROS:   All systems reviewed and negative except as noted in the HPI.   Past Surgical History  Procedure Date  . Cesarean section 1997  . Tubal ligation 1999    Reversal-2002  . Esophagogastroduodenoscopy 11/19/2010    Rehman, MD  . Laparotomy 11/19/2010    Fabio Bering  . Gastrectomy 11/19/2010    "  . Gastrojejunostomy 11/19/2010    "  . Cardiac defibrillator placement 10/02/11  . Cholecystectomy 11/09/2010    Fabio Bering  . Neuroplasty / transposition median nerve at carpal tunnel bilateral ~ 2008     Family History  Problem Relation Age of Onset  .  Stroke Father     Died recently, Age 30  . Heart failure Mother   . Diabetes Sister   . Pneumonia Father   . Coronary artery disease Father   . Parkinsonism Mother      History   Social History  . Marital Status: Married    Spouse Name: N/A    Number of Children: N/A  . Years of Education: N/A   Occupational History  . Not on file.   Social History Main Topics  . Smoking status: Never Smoker   . Smokeless tobacco: Never Used  . Alcohol Use: No  . Drug Use: No  . Sexually Active: Yes    Birth Control/ Protection: None, Surgical   Other Topics Concern  . Not on file   Social History Narrative  . No narrative on file     BP 122/76  Pulse 87  Ht 5\' 5"  (1.651 m)  Wt 288 lb 1.9 oz (130.69 kg)  BMI 47.95 kg/m2  LMP 11/05/2011  Physical Exam:  Well appearing morbidly obese, young woman, NAD HEENT: Unremarkable Neck:  7 cm JVD, no thyromegally Lungs:   Clear with no wheezes, rales, or rhonchi. HEART:  Regular rate rhythm, no murmurs, no rubs, no clicks Abd:  soft, positive bowel sounds, no organomegally, no rebound, no guarding Ext:  2 plus pulses, no edema, no cyanosis, no clubbing Skin:  No rashes no nodules Neuro:  CN II through XII intact, motor grossly intact  EKG normal sinus rhythm with biventricular pacing  DEVICE  Normal device function.  See PaceArt for details.   Assess/Plan:

## 2012-05-01 NOTE — Assessment & Plan Note (Signed)
Her chronic systolic heart failure appears to be class II. I've encouraged the patient to reduce her sodium intake, and we will make adjustments in her medications.

## 2012-05-01 NOTE — Assessment & Plan Note (Signed)
Interrogation of her ICD today demonstrates no ICD shocks, and normal left ventricular pacing thresholds. We'll plan to see her back in several months. No programming changes were made today.

## 2012-05-07 ENCOUNTER — Encounter: Payer: Self-pay | Admitting: Internal Medicine

## 2012-05-08 ENCOUNTER — Ambulatory Visit: Payer: Medicaid Other | Admitting: Cardiology

## 2012-05-11 ENCOUNTER — Encounter: Payer: Self-pay | Admitting: Cardiology

## 2012-05-11 ENCOUNTER — Ambulatory Visit (INDEPENDENT_AMBULATORY_CARE_PROVIDER_SITE_OTHER): Payer: Medicaid Other | Admitting: Cardiology

## 2012-05-11 VITALS — BP 126/70 | HR 90 | Ht 65.0 in | Wt 291.0 lb

## 2012-05-11 DIAGNOSIS — Z95810 Presence of automatic (implantable) cardiac defibrillator: Secondary | ICD-10-CM

## 2012-05-11 DIAGNOSIS — I429 Cardiomyopathy, unspecified: Secondary | ICD-10-CM

## 2012-05-11 DIAGNOSIS — I10 Essential (primary) hypertension: Secondary | ICD-10-CM

## 2012-05-11 DIAGNOSIS — I509 Heart failure, unspecified: Secondary | ICD-10-CM

## 2012-05-11 DIAGNOSIS — I428 Other cardiomyopathies: Secondary | ICD-10-CM

## 2012-05-11 DIAGNOSIS — I951 Orthostatic hypotension: Secondary | ICD-10-CM

## 2012-05-11 DIAGNOSIS — E782 Mixed hyperlipidemia: Secondary | ICD-10-CM

## 2012-05-11 DIAGNOSIS — N182 Chronic kidney disease, stage 2 (mild): Secondary | ICD-10-CM

## 2012-05-11 DIAGNOSIS — D649 Anemia, unspecified: Secondary | ICD-10-CM

## 2012-05-11 NOTE — Assessment & Plan Note (Signed)
Despite recent coronary sinus lead replacement, biventricular pacing has been maintained for the past 8 months. LV ejection fraction will be reassessed.

## 2012-05-11 NOTE — Assessment & Plan Note (Signed)
Most likely intrinsic renal function is normal or near-normal, and creatinine elevation is primarily due to cardiorenal syndrome. We will continue to monitor and adjust medication to minimize adverse renal effects.

## 2012-05-11 NOTE — Progress Notes (Deleted)
Name: Karla Anderson    DOB: [birth year]  Age: 42 y.o.  MR#: [id number]       PCP:  Avon Gully, MD      Insurance: @PAYORNAME @   CC:   No chief complaint on file.  MEDICATION LIST CONTINUES TO C/O PALPITATIONS DR Ladona Ridgel 05/01/12 S/P LEAD REVISION 04/15/12  VS BP 118/80  Pulse 67  Ht 5\' 5"  (1.651 m)  Wt 291 lb (131.997 kg)  BMI 48.43 kg/m2  SpO2 100%  LMP 11/05/2011  Weights Current Weight  05/11/12 291 lb (131.997 kg)  05/01/12 288 lb 1.9 oz (130.69 kg)  04/28/12 288 lb (130.636 kg)    Blood Pressure  BP Readings from Last 3 Encounters:  05/11/12 118/80  05/01/12 122/76  04/28/12 123/66     Admit date:  (Not on file) Last encounter with RMR:  05/08/2012   Allergy No Known Allergies  Current Outpatient Prescriptions  Medication Sig Dispense Refill  . carvedilol (COREG) 6.25 MG tablet Take 1 tablet (6.25 mg total) by mouth 2 (two) times daily with a meal.  60 tablet  6  . furosemide (LASIX) 40 MG tablet Take 1 tablet (40 mg total) by mouth daily.  30 tablet  6  . HYDROcodone-acetaminophen (NORCO) 5-325 MG per tablet Take 1 tablet by mouth every 6 (six) hours as needed. For pain.      Marland Kitchen lisinopril (PRINIVIL,ZESTRIL) 5 MG tablet Take 1 tablet (5 mg total) by mouth daily.  30 tablet  6  . Multiple Vitamins-Minerals (ADULT ONE DAILY GUMMIES) CHEW Chew 2 each by mouth every morning.      Marland Kitchen QUEtiapine (SEROQUEL) 300 MG tablet Take 600 mg by mouth at bedtime.        . sertraline (ZOLOFT) 100 MG tablet Take 100 mg by mouth daily.      Marland Kitchen spironolactone (ALDACTONE) 25 MG tablet Take 1 tablet (25 mg total) by mouth daily.  30 tablet  6  . tiZANidine (ZANAFLEX) 4 MG tablet Take 4 mg by mouth every 6 (six) hours as needed. For muscle spasms      . traZODone (DESYREL) 150 MG tablet Take 150 mg by mouth at bedtime.        . ziprasidone (GEODON) 80 MG capsule Take 160 mg by mouth at bedtime.        Marland Kitchen zolpidem (AMBIEN) 10 MG tablet Take 10 mg by mouth at bedtime as needed. For sleep        No current facility-administered medications for this visit.    Discontinued Meds:   There are no discontinued medications.  Patient Active Problem List  Diagnosis  . Morbid obesity  . Bipolar 1 disorder  . Cardiomyopathy, nonischemic  . Anxiety and depression  . Hypertension  . ICD-[employer]  . Anemia, normocytic normochromic  . History of diagnostic tests  . Chronic kidney disease, stage II (mild)  . ICD (implantable cardiac defibrillator) malfunction  . Chronic systolic heart failure  . Orthostatic hypotension    LABS Clinical Support on 04/21/2012  Component Date Value  . DEVICE MODEL ICD 04/21/2012 409811   . DEV-[id number] 04/21/2012 Lewayne Bunting   M.D.   . Nira Conn 04/21/2012 N   . [id number] 04/21/2012 Lewayne Bunting   M.D.   . Sherlon Handing 04/21/2012 Lewayne Bunting   M.D.   . Sherlon Handing 04/21/2012 Lewayne Bunting   M.D.   . PACEART [facility] NOTES ICD 04/21/2012  Value:Wound check appointment. Steri-strips removed. Wound without redness or edema. Incision edges approximated, wound well healed. Normal device function. Thresholds, sensing, and impedances consistent with implant measurements. Device programmed at 3.5V for                          extra safety margin until 3 month visit. Histogram distribution appropriate for patient and level of activity. No mode switches or ventricular arrhythmias noted. Patient educated about wound care, arm mobility, lifting restrictions, shock plan. ROV in 3                          months with implanting physician.  She is s/p LV lead revision.  We will recheck her wound 05/01/12 in [place].  Marland Kitchen AL IMPEDENCE ICD 04/21/2012 546   . LV LEAD IMPEDENCE ICD 04/21/2012 1352   . RV LEAD IMPEDENCE ICD 04/21/2012 552   . HV IMPEDENCE 04/21/2012 74   . VENTRICULAR PACING ICD 04/21/2012 100   . ATRIAL PACING ICD 04/21/2012 1   . AL AMPLITUDE 04/21/2012 6.7   . RV LEAD AMPLITUDE 04/21/2012 25   . LV LEAD AMPLITUDE  04/21/2012 25   . AL THRESHOLD 04/21/2012 0.8   . RV LEAD THRESHOLD 04/21/2012 0.4   . LV LEAD THRESHOLD 04/21/2012 0.5   Admission on 04/15/2012, Discharged on 04/16/2012  Component Date Value  . MRSA, PCR 04/15/2012 NEGATIVE   . Staphylococcus aureus 04/15/2012 POSITIVE*  . Preg Test, Ur 04/15/2012 NEGATIVE   Orders Only on 04/10/2012  Component Date Value  . Sodium 04/10/2012 137   . Potassium 04/10/2012 5.5*  . Chloride 04/10/2012 101   . CO2 04/10/2012 28   . Glucose, Bld 04/10/2012 82   . BUN 04/10/2012 34*  . Creat 04/10/2012 2.15*  . Calcium 04/10/2012 9.2   . WBC 04/10/2012 7.7   . RBC 04/10/2012 3.62*  . Hemoglobin 04/10/2012 10.7*  . HCT 04/10/2012 32.6*  . MCV 04/10/2012 90.1   . MCH 04/10/2012 29.6   . MCHC 04/10/2012 32.8   . RDW 04/10/2012 14.0   . Platelets 04/10/2012 308   . aPTT 04/10/2012 36.1   . Prothrombin Time 04/10/2012 13.4   . INR 04/10/2012 1.03   Office Visit on 04/06/2012  Component Date Value  . DEVICE MODEL ICD 04/06/2012 829562   . DEV-0014ICD 04/06/2012 Lewayne Bunting   M.D.   . Nira Conn 04/06/2012 N   . [id number] 04/06/2012 Lewayne Bunting   M.D.   . Sherlon Handing 04/06/2012 Lewayne Bunting   M.D.   . Sherlon Handing 04/06/2012 Lewayne Bunting   M.D.   . PACEART [facility] NOTES ICD 04/06/2012                     Value:CRT-D device check in office. Thresholds and sensing consistent with previous device measurements. Lead impedance trends stable over time. No mode switch episodes recorded. No ventricular arrhythmia episodes recorded. Patient bi-ventricularly pacing                          >100% of the time. Device programmed with appropriate safety margins. Heart failure diagnostics reviewed and trends are stable for patient. Audible/vibratory alerts demonstrated for patient. No changes made this session. Estimated longevity 8 years.                           Patient  education completed including shock plan.  Patient to be scheduled for LV lead revision.   . AL IMPEDENCE ICD 04/06/2012 660   . LV LEAD IMPEDENCE ICD 04/06/2012 429   . RV LEAD IMPEDENCE ICD 04/06/2012 578   . HV IMPEDENCE 04/06/2012 92   . AL AMPLITUDE 04/06/2012 6.9   . RV LEAD AMPLITUDE 04/06/2012 25   . AL THRESHOLD 04/06/2012 0.9   . RV LEAD THRESHOLD 04/06/2012 0.5   . LV LEAD THRESHOLD 04/06/2012 1   Office Visit on 03/31/2012  Component Date Value  . Sodium 04/03/2012 134*  . Potassium 04/03/2012 5.1   . Chloride 04/03/2012 104   . CO2 04/03/2012 22   . Glucose, Bld 04/03/2012 89   . BUN 04/03/2012 38*  . Creat 04/03/2012 2.50*  . Total Bilirubin 04/03/2012 0.5   . Alkaline Phosphatase 04/03/2012 100   . AST 04/03/2012 17   . ALT 04/03/2012 10   . Total Protein 04/03/2012 6.3   . Albumin 04/03/2012 3.6   . Calcium 04/03/2012 8.7   . WBC 04/03/2012 8.3   . RBC 04/03/2012 3.44*  . Hemoglobin 04/03/2012 10.0*  . HCT 04/03/2012 30.5*  . MCV 04/03/2012 88.7   . [facility] 04/03/2012 29.1   . MCHC 04/03/2012 32.8   . RDW 04/03/2012 14.4   . Platelets 04/03/2012 239   Office Visit on 02/25/2012  Component Date Value  . Brain Natriuretic Peptide 02/25/2012 86.5   . Sodium 02/25/2012 137   . Potassium 02/25/2012 4.8   . Chloride 02/25/2012 100   . CO2 02/25/2012 30   . Glucose, Bld 02/25/2012 78   . BUN 02/25/2012 18   . Creat 02/25/2012 1.35*  . Total Bilirubin 02/25/2012 0.4   . Alkaline Phosphatase 02/25/2012 86   . AST 02/25/2012 18   . ALT 02/25/2012 9   . Total Protein 02/25/2012 7.0   . Albumin 02/25/2012 4.2   . Calcium 02/25/2012 8.8   . HIV 02/25/2012 NON REACTIVE      Results for this Opt Visit:     Results for orders placed in visit on 04/21/12  ICD DEVICE OBSERVATION      Result Value Range   DEVICE MODEL ICD 161096     DEV-0014ICD Lewayne Bunting   M.D.     DEV-0020ICD N     DEV-0014LDO Lewayne Bunting   M.D.     [id number] Lewayne Bunting   M.D.     [id number] Lewayne Bunting   M.D.     [facility] NOTES ICD       Value: Wound check  appointment. Steri-strips removed. Wound without redness or edema. Incision edges approximated, wound well healed. Normal device function. Thresholds, sensing, and impedances consistent with implant measurements. Device programmed at 3.5V for      extra safety margin until 3 month visit. Histogram distribution appropriate for patient and level of activity. No mode switches or ventricular arrhythmias noted. Patient educated about wound care, arm mobility, lifting restrictions, shock plan. ROV in 3      months with implanting physician.  She is s/p LV lead revision.  We will recheck her wound 05/01/12 in [place].   AL IMPEDENCE ICD 546     LV LEAD IMPEDENCE ICD 1352     RV LEAD IMPEDENCE ICD 552     HV IMPEDENCE 74     VENTRICULAR PACING ICD 100     ATRIAL PACING ICD 1     AL AMPLITUDE 6.7  RV LEAD AMPLITUDE 25     LV LEAD AMPLITUDE 25     AL THRESHOLD 0.8     RV LEAD THRESHOLD 0.4     LV LEAD THRESHOLD 0.5      EKG Orders placed in visit on 05/01/12  . EKG 12-LEAD     Prior Assessment and Plan Problem List as of 05/11/2012     ICD-9-CM     Cardiology Problems   Cardiomyopathy, nonischemic   Last Assessment & Plan   03/31/2012 Office Visit Edited 03/31/2012  2:41 PM by Kathlen Brunswick, MD     CHF appears well compensated at the present time.  Treatment regime is complex and compromised by limitations in patients intellectual capabilities.  We will continue to support compliance to the extent possible.    Hypertension   Last Assessment & Plan   03/31/2012 Office Visit Written 03/31/2012  2:40 PM by Kathlen Brunswick, MD     Blood pressure elevation is certainly not an issue at present and likely will no longer be, as treatment for cardiomyopathy will adequately manage her mild hypertension as well.    ICD (implantable cardiac defibrillator) malfunction   Last Assessment & Plan   04/06/2012 Office Visit Written 04/06/2012 10:30 AM by Marinus Maw, MD     While her device is  technically working ok. She has clear dislodgement of the LV lead into the CS. If I new that the lead would stay in place I would not recommend revision. Unfortunately, it is likely to retract further. Will schedule her for LV lead revision. I have discussed the risks/benefits/goals/expectations of the procedure with the patient and she wishes to proceed.    Chronic systolic heart failure   Last Assessment & Plan   05/01/2012 Office Visit Written 05/01/2012 12:24 PM by Marinus Maw, MD     Her chronic systolic heart failure appears to be class II. I've encouraged the patient to reduce her sodium intake, and we will make adjustments in her medications.    Orthostatic hypotension   Last Assessment & Plan   05/01/2012 Office Visit Written 05/01/2012 12:25 PM by Marinus Maw, MD     Her syncopal episode appears to be due to orthostasis. I've asked that she reduce her dose of carvedilol to 6.25 mg twice daily and her dose of furosemide to 40 mg daily. In addition, I've asked the patient to reduce her lisinopril to 5 mg daily. Hopefully these changes will reduce the likelihood of recurrent syncope.      Other   Morbid obesity   Last Assessment & Plan   03/31/2012 Office Visit Written 03/31/2012  2:37 PM by Kathlen Brunswick, MD     Slight weight loss since her last visit.  Concerted effort at weight loss will be made once cardiac status is stable.    Bipolar 1 disorder   Anxiety and depression   ICD-[employer]   Last Assessment & Plan   05/01/2012 Office Visit Written 05/01/2012 12:24 PM by Marinus Maw, MD     Interrogation of her ICD today demonstrates no ICD shocks, and normal left ventricular pacing thresholds. We'll plan to see her back in several months. No programming changes were made today.    Anemia, normocytic normochromic   Last Assessment & Plan   02/25/2012 Office Visit Edited 02/26/2012  9:04 AM by Kathlen Brunswick, MD     Substantial improvement in hemoglobin increasing  from 9.8 2 months ago to  12.1 one month ago.  CBC will be reassessed.    History of diagnostic tests   Chronic kidney disease, stage II (mild)   Last Assessment & Plan   03/31/2012 Office Visit Written 03/31/2012  2:41 PM by Kathlen Brunswick, MD     Repeat assessment of renal function and electrolytes is pending.        Imaging: Dg Chest 2 View  04/16/2012  *RADIOLOGY REPORT*  Clinical Data: Status post LV lead revision  CHEST - 2 VIEW  Comparison: 04/02/2012  Findings: Lungs are clear. No pleural effusion or pneumothorax.  Cardiomediastinal silhouette is within normal limits.  Left subclavian ICD in satisfactory position.  Visualized osseous structures are within normal limits.  Cholecystectomy clips.  IMPRESSION: No evidence of acute cardiopulmonary disease.   Original Report Authenticated By: Charline Bills, M.D.    Dg Foot Complete Left  04/28/2012  *RADIOLOGY REPORT*  Clinical Data: Right foot pain at base of big toe and medial foot  LEFT FOOT - COMPLETE 3+ VIEW  Comparison: None.  Findings: No acute fracture, malalignment or focal soft tissue swelling.  Incompletely imaged surgical changes of prior ORIF of a healed distal fibular fracture with lateral buttress plate and screw construct.  Bony mineralization is within normal limits.  No aggressive appearing lytic or blastic osseous lesion.  Incidental note is made of an os tibialis externa.  IMPRESSION: No acute fracture or malalignment.   Original Report Authenticated By: Malachy Moan, M.D.      [facility] Calculation: Score not calculated. Missing: Total Cholesterol

## 2012-05-11 NOTE — Patient Instructions (Addendum)
Your physician recommends that you schedule a follow-up appointment in: 2 MONTHS WITH RR  Your physician has recommended you make the following change in your medication:   1) DECREASE ALDACTONE TO 12.5 ONCE DAILY  Your physician has requested that you have an echocardiogram. Echocardiography is a painless test that uses sound waves to create images of your heart. It provides your doctor with information about the size and shape of your heart and how well your heart's chambers and valves are working. This procedure takes approximately one hour. There are no restrictions for this procedure.  YOUR PHYSICIAN RECOMMENDS THAT YOU IMPLEMENT A LOW POTASSIUM DIET, INFORMATION ENCLOSED WITH YOUR DISCHARGE SUMMERY   Your physician recommends that you return for lab work in: TODAY (CMET,CBC,BNP LEVEL) SLIPS GIVEN  Your physician recommends that you return for lab work in: 3 WEEKS Your physician recommends that you have follow up lab work, we will mail you a reminder letter to alert you when to go [employer], located across the street from our office.

## 2012-05-11 NOTE — Assessment & Plan Note (Addendum)
Patient has had a long-standing and unexplained anemia; repeat CBC with iron studies will be obtained.  Additional assessment will be undertaken based upon those results.

## 2012-05-11 NOTE — Assessment & Plan Note (Signed)
Blood pressure is higher than at previous visits without significant orthostatic change; however, patient reports at least occasional very low systolic blood pressures at home. Increase salt intake and holding of other medications when that occurs has been recommended to her.

## 2012-05-11 NOTE — Assessment & Plan Note (Signed)
Functional status is class II. She tolerates only very low doses of appropriate medication, but has had nearly a year of biventricular pacing, which may have resulted in substantial improvement in LV systolic function. Echocardiography will be repeated. Metabolic profile will be monitored to reassess renal function and to maintain normal electrolytes. Low potassium diet recommended. Aldactone dosage will be decreased to 12.5 mg per day.

## 2012-05-11 NOTE — Progress Notes (Signed)
Patient ID: Karla Anderson, female   DOB: 1970-07-16, 42 y.o.   MRN: [id number]  HPI: Scheduled return visit for this very nice young woman with a nonischemic cardiomyopathy. She underwent revision of a coronary sinus lead approximately one month ago without complications; however, preprocedure labs demonstrated hyperkalemia and acute renal insufficiency. Based upon continuing episodes of syncope and relative hypotension, dosage of diuretic, beta blocker and ACE inhibitor were further reduced, but no subsequent laboratory tests were performed. Patient is doing quite well with essentially no cardiopulmonary symptoms and a sedentary lifestyle.  Prior to Admission medications   Medication Sig Start Date End Date Taking? Authorizing Provider  carvedilol (COREG) 6.25 MG tablet Take 1 tablet (6.25 mg total) by mouth 2 (two) times daily with a meal. 05/01/12  Yes Marinus Maw, MD  furosemide (LASIX) 40 MG tablet Take 1 tablet (40 mg total) by mouth daily. 05/01/12  Yes Marinus Maw, MD  HYDROcodone-acetaminophen (NORCO) 5-325 MG per tablet Take 1 tablet by mouth every 6 (six) hours as needed. For pain.   Yes Historical Provider, MD  lisinopril (PRINIVIL,ZESTRIL) 5 MG tablet Take 1 tablet (5 mg total) by mouth daily. 05/01/12  Yes Marinus Maw, MD  Multiple Vitamins-Minerals (ADULT ONE DAILY GUMMIES) CHEW Chew 2 each by mouth every morning.   Yes Historical Provider, MD  QUEtiapine (SEROQUEL) 300 MG tablet Take 600 mg by mouth at bedtime.     Yes Historical Provider, MD  sertraline (ZOLOFT) 100 MG tablet Take 100 mg by mouth daily.   Yes Historical Provider, MD  spironolactone (ALDACTONE) 25 MG tablet Take 1 tablet (25 mg total) by mouth daily. 01/10/12  Yes Kathlen Brunswick, MD  tiZANidine (ZANAFLEX) 4 MG tablet Take 4 mg by mouth every 6 (six) hours as needed. For muscle spasms   Yes Historical Provider, MD  traZODone (DESYREL) 150 MG tablet Take 150 mg by mouth at bedtime.     Yes Historical Provider,  MD  ziprasidone (GEODON) 80 MG capsule Take 160 mg by mouth at bedtime.     Yes Historical Provider, MD  zolpidem (AMBIEN) 10 MG tablet Take 10 mg by mouth at bedtime as needed. For sleep   Yes Historical Provider, MD  No Known Allergies    Past medical history, social history, and family history reviewed and updated.  ROS: Denies orthopnea, PND (sleeps with 4 pillows, but does not need them), chest pain, exertional dyspnea with walking on the flat, or recent dizziness or syncope although blood pressure systolic was in the 60s within the past few weeks. All other systems reviewed and are negative.  PHYSICAL EXAM: BP 118/80  Pulse 67  Ht 5\' 5"  (1.651 m)  Wt 131.997 kg (291 lb)  BMI 48.43 kg/m2  SpO2 100%  LMP 11/05/2011 ; weight increased 3 pounds General-Well developed; no acute distress Body habitus-markedly overweight Neck-No JVD; no carotid bruits Lungs-clear lung fields; resonant to percussion Cardiovascular-normal PMI; normal S1 and S2; minimal early systolic ejection murmur Abdomen-normal bowel sounds; soft and non-tender without masses or organomegaly Musculoskeletal-No deformities, no cyanosis or clubbing Neurologic-Normal cranial nerves; symmetric strength and tone Skin-Warm, no significant lesions Extremities-distal pulses intact; no edema  Rhythm Strip: Atrial synchronous ventricular pacing at a rate of 98 bpm.  ASSESSMENT AND PLAN:  [place] Bing, MD 05/11/2012 1:54 PM

## 2012-05-12 ENCOUNTER — Encounter: Payer: Self-pay | Admitting: Cardiology

## 2012-05-12 LAB — CBC
HCT: 33.4 % — ABNORMAL LOW (ref 36.0–46.0)
MCH: 30.1 pg (ref 26.0–34.0)
MCV: 91.3 fL (ref 78.0–100.0)
Platelets: 302 10*3/uL (ref 150–400)
RDW: 14.8 % (ref 11.5–15.5)
WBC: 11 10*3/uL — ABNORMAL HIGH (ref 4.0–10.5)

## 2012-05-12 LAB — COMPREHENSIVE METABOLIC PANEL
ALT: 9 U/L (ref 0–35)
AST: 16 U/L (ref 0–37)
Alkaline Phosphatase: 119 U/L — ABNORMAL HIGH (ref 39–117)
Creat: 1.58 mg/dL — ABNORMAL HIGH (ref 0.50–1.10)
Sodium: 136 mEq/L (ref 135–145)
Total Bilirubin: 0.3 mg/dL (ref 0.3–1.2)
Total Protein: 7.2 g/dL (ref 6.0–8.3)

## 2012-05-18 ENCOUNTER — Ambulatory Visit (HOSPITAL_COMMUNITY)
Admission: RE | Admit: 2012-05-18 | Discharge: 2012-05-18 | Disposition: A | Discharge status: Home / Self Care | Payer: Medicaid Other | Source: Ambulatory Visit | Attending: Cardiology | Admitting: Cardiology

## 2012-05-18 DIAGNOSIS — I10 Essential (primary) hypertension: Secondary | ICD-10-CM | POA: Insufficient documentation

## 2012-05-18 DIAGNOSIS — E782 Mixed hyperlipidemia: Secondary | ICD-10-CM

## 2012-05-18 DIAGNOSIS — I429 Cardiomyopathy, unspecified: Secondary | ICD-10-CM

## 2012-05-18 DIAGNOSIS — I509 Heart failure, unspecified: Secondary | ICD-10-CM | POA: Insufficient documentation

## 2012-05-18 DIAGNOSIS — I5022 Chronic systolic (congestive) heart failure: Secondary | ICD-10-CM | POA: Insufficient documentation

## 2012-05-18 DIAGNOSIS — I428 Other cardiomyopathies: Secondary | ICD-10-CM | POA: Insufficient documentation

## 2012-05-18 DIAGNOSIS — I517 Cardiomegaly: Secondary | ICD-10-CM

## 2012-05-18 NOTE — Progress Notes (Signed)
*  PRELIMINARY RESULTS* Echocardiogram 2D Echocardiogram has been performed.  Conrad [place] 05/18/2012, 10:03 AM

## 2012-05-20 ENCOUNTER — Encounter: Payer: Self-pay | Admitting: *Deleted

## 2012-05-21 ENCOUNTER — Encounter: Payer: Self-pay | Admitting: *Deleted

## 2012-05-22 ENCOUNTER — Encounter: Payer: Self-pay | Admitting: *Deleted

## 2012-05-27 ENCOUNTER — Telehealth: Payer: Self-pay | Admitting: Internal Medicine

## 2012-05-27 ENCOUNTER — Other Ambulatory Visit: Payer: Self-pay | Admitting: *Deleted

## 2012-05-27 DIAGNOSIS — I509 Heart failure, unspecified: Secondary | ICD-10-CM

## 2012-05-27 DIAGNOSIS — I10 Essential (primary) hypertension: Secondary | ICD-10-CM

## 2012-05-27 MED ORDER — CEPHALEXIN 500 MG PO CAPS: 500.0000 mg | ORAL_CAPSULE | Freq: Three times a day (TID) | ORAL | Status: AC

## 2012-05-27 NOTE — Telephone Encounter (Signed)
FYI:Pt noted a sore in the spot that area, for a few days, noted seeping this am, denies any other sxs of concern, advised for pt to come into office for an NV to review the area, pt accepted apt for tomorrow for a NV per verbal from TC, will review at that time

## 2012-05-27 NOTE — Telephone Encounter (Signed)
Per Dr. Ladona Ridgel, patient to start Keflex 500mg  1 po every 8 hours for 10 days.  Rx faxed to [facility] in [place].  Office visit 05/28/12 with the device clinic.  Patient is in agreement.

## 2012-05-27 NOTE — Telephone Encounter (Signed)
PT HAVE A SPOT THAT IS RED AND YELLOW LIQUID COMING OUT FROM WEAR WE PUT NEW LEADS IN.

## 2012-05-28 ENCOUNTER — Ambulatory Visit (INDEPENDENT_AMBULATORY_CARE_PROVIDER_SITE_OTHER): Payer: Medicaid Other | Admitting: *Deleted

## 2012-05-28 NOTE — Progress Notes (Signed)
Wound check for stitch abscess.  The patient was started on Keflex 500mg  1 po every 8 hours for 10 days but due to financial restrictions she has not been able to pick up the Rx.  The patient was also instructed to wash the area with warm antibacterial soap twice daily.  I spoke with the pharm D @ [facility] in [place], since the patient has Medicare arrangement were made for her to pick up the Rx today and pay at another time.  Patient is in agreement and will do so today.  ROV on Monday in [place] for another wound check with Dr. Ladona Ridgel.

## 2012-06-01 ENCOUNTER — Ambulatory Visit: Payer: Medicaid Other

## 2012-06-01 ENCOUNTER — Encounter: Payer: Self-pay | Admitting: *Deleted

## 2012-06-04 ENCOUNTER — Ambulatory Visit: Payer: Medicaid Other

## 2012-06-11 ENCOUNTER — Ambulatory Visit: Payer: Medicaid Other

## 2012-06-29 ENCOUNTER — Encounter: Payer: Medicaid Other | Admitting: Internal Medicine

## 2012-06-30 ENCOUNTER — Encounter: Payer: Self-pay | Admitting: Internal Medicine

## 2012-07-01 ENCOUNTER — Encounter: Payer: Self-pay | Admitting: Internal Medicine

## 2012-07-09 ENCOUNTER — Ambulatory Visit: Payer: Medicaid Other | Admitting: Cardiology

## 2012-07-10 ENCOUNTER — Telehealth: Payer: Self-pay | Admitting: Cardiology

## 2012-07-10 NOTE — Telephone Encounter (Signed)
Patient thought she was supposed to have lab work, she received a letter.  Please call patient and let her know if labs are due and when.  Patient also recently moved, updated address and phone number.

## 2012-07-10 NOTE — Telephone Encounter (Signed)
Was past due, therefore needs to have done as soon as possible. Verbalized understanding.

## 2012-07-28 ENCOUNTER — Ambulatory Visit: Payer: Medicaid Other | Admitting: Cardiology

## 2012-08-03 ENCOUNTER — Encounter (HOSPITAL_COMMUNITY): Payer: Self-pay | Admitting: *Deleted

## 2012-08-03 ENCOUNTER — Emergency Department (HOSPITAL_COMMUNITY)
Admission: EM | Admit: 2012-08-03 | Discharge: 2012-08-03 | Disposition: A | Discharge status: Home / Self Care | Payer: Medicaid Other | Attending: Emergency Medicine | Admitting: Emergency Medicine

## 2012-08-03 DIAGNOSIS — R1110 Vomiting, unspecified: Secondary | ICD-10-CM | POA: Insufficient documentation

## 2012-08-03 DIAGNOSIS — F319 Bipolar disorder, unspecified: Secondary | ICD-10-CM | POA: Insufficient documentation

## 2012-08-03 DIAGNOSIS — Z95810 Presence of automatic (implantable) cardiac defibrillator: Secondary | ICD-10-CM | POA: Insufficient documentation

## 2012-08-03 DIAGNOSIS — F341 Dysthymic disorder: Secondary | ICD-10-CM | POA: Insufficient documentation

## 2012-08-03 DIAGNOSIS — Z79899 Other long term (current) drug therapy: Secondary | ICD-10-CM | POA: Insufficient documentation

## 2012-08-03 DIAGNOSIS — Z8679 Personal history of other diseases of the circulatory system: Secondary | ICD-10-CM | POA: Insufficient documentation

## 2012-08-03 DIAGNOSIS — Z8739 Personal history of other diseases of the musculoskeletal system and connective tissue: Secondary | ICD-10-CM | POA: Insufficient documentation

## 2012-08-03 DIAGNOSIS — I10 Essential (primary) hypertension: Secondary | ICD-10-CM | POA: Insufficient documentation

## 2012-08-03 LAB — BASIC METABOLIC PANEL
BUN: 14 mg/dL (ref 6–23)
Calcium: 8.8 mg/dL (ref 8.4–10.5)
GFR calc Af Amer: 63 mL/min — ABNORMAL LOW (ref >90)
GFR calc non Af Amer: 54 mL/min — ABNORMAL LOW (ref >90)
Glucose, Bld: 99 mg/dL (ref 70–99)

## 2012-08-03 LAB — CBC WITH DIFFERENTIAL/PLATELET
Basophils Relative: 0 % (ref 0–1)
Eosinophils Absolute: 0.2 10*3/uL (ref 0.0–0.7)
Eosinophils Relative: 1 % (ref 0–5)
Lymphs Abs: 2.4 10*3/uL (ref 0.7–4.0)
MCH: 29.3 pg (ref 26.0–34.0)
MCHC: 33.2 g/dL (ref 30.0–36.0)
MCV: 88 fL (ref 78.0–100.0)
Monocytes Relative: 7 % (ref 3–12)
Neutrophils Relative %: 69 % (ref 43–77)
Platelets: 272 10*3/uL (ref 150–400)
RBC: 4.17 MIL/uL (ref 3.87–5.11)

## 2012-08-03 MED ORDER — ONDANSETRON 4 MG PO TBDP
4.0000 mg | ORAL_TABLET | Freq: Once | ORAL | Status: AC
  Administered 2012-08-03: 4 mg via ORAL
  Filled 2012-08-03: qty 1

## 2012-08-03 MED ORDER — PROMETHAZINE HCL 25 MG PO TABS: 25.0000 mg | ORAL_TABLET | Freq: Four times a day (QID) | ORAL | Status: DC | PRN

## 2012-08-03 NOTE — ED Notes (Signed)
Vomiting for 2 days, with "a little blood in it"   Alert, No abd pain

## 2012-08-03 NOTE — ED Notes (Signed)
Pt alert & oriented x4, stable gait. Patient given discharge instructions, paperwork & prescription(s). Patient  instructed to stop at the registration desk to finish any additional paperwork. Patient verbalized understanding. Pt left department w/ no further questions. 

## 2012-08-03 NOTE — ED Provider Notes (Signed)
History     CSN: [id number]  Arrival date & time 08/03/12  1714   First MD Initiated Contact with Patient 08/03/12 2017      Chief Complaint  Patient presents with  . Emesis    (Consider location/radiation/quality/duration/timing/severity/associated sxs/prior treatment) Patient is a 42 y.o. female presenting with vomiting. The history is provided by the patient (the pt states she has been vomiting today). No language interpreter was used.  Emesis Severity:  Moderate Timing:  Constant Quality:  Undigested food Able to tolerate:  Liquids Progression:  Unchanged Chronicity:  New Recent urination:  Normal Context: not post-tussive   Associated symptoms: no abdominal pain, no diarrhea and no headaches     Past Medical History  Diagnosis Date  . Wide-complex tachycardia     Rate related LBBB - seen by Dr. Graciela Husbands and Dr. Dietrich Pates in the past  . Cardiomyopathy, nonischemic     + murmur; h/o CHF, chest pain; EF-10%  . Anxiety and depression     - sleep study in 12/2011  . Bipolar 1 disorder   . Hypertension   . ICD (implantable cardiac defibrillator) in place 10/02/11    2013; syncope postimplantation attributed to dehydration  . History of blood transfusion 10/2010  . Degenerative joint disease     Bilateral knee; back    Past Surgical History  Procedure Laterality Date  . Cesarean section  1997  . Tubal ligation  1999    Reversal-2002  . Esophagogastroduodenoscopy  11/19/2010    Rehman, MD  . Laparotomy  11/19/2010    Fabio Bering  . Gastrectomy  11/19/2010    "  . Gastrojejunostomy  11/19/2010    "  . Cholecystectomy  11/09/2010    Fabio Bering  . Neuroplasty / transposition median nerve at carpal tunnel bilateral  ~ 2008  . Cardiac defibrillator placement  10/02/11    Family History  Problem Relation Age of Onset  . Stroke Father     Died recently, Age 36  . Heart failure Mother   . Diabetes Sister   . Pneumonia Father   . Coronary artery disease Father   .  Parkinsonism Mother     History  Substance Use Topics  . Smoking status: Never Smoker   . Smokeless tobacco: Never Used  . Alcohol Use: No    OB History   Grav Para Term Preterm Abortions TAB SAB Ect Mult Living                  Review of Systems  Constitutional: Negative for appetite change and fatigue.  HENT: Negative for congestion, sinus pressure and ear discharge.   Eyes: Negative for discharge.  Respiratory: Negative for cough.   Cardiovascular: Negative for chest pain.  Gastrointestinal: Positive for vomiting. Negative for abdominal pain and diarrhea.  Genitourinary: Negative for frequency and hematuria.  Musculoskeletal: Negative for back pain.  Skin: Negative for rash.  Neurological: Negative for seizures and headaches.  Psychiatric/Behavioral: Negative for hallucinations.    Allergies  Review of patient's allergies indicates no known allergies.  Home Medications   Current Outpatient Rx  Name  Route  Sig  Dispense  Refill  . carvedilol (COREG) 6.25 MG tablet   Oral   Take 1 tablet (6.25 mg total) by mouth 2 (two) times daily with a meal.   60 tablet   6   . furosemide (LASIX) 40 MG tablet   Oral   Take 40 mg by mouth 2 (two) times  daily.         Marland Kitchen HYDROcodone-acetaminophen (NORCO) 5-325 MG per tablet   Oral   Take 1 tablet by mouth every 6 (six) hours as needed. For pain.         Marland Kitchen lisinopril (PRINIVIL,ZESTRIL) 5 MG tablet   Oral   Take 5 mg by mouth every morning.         . Multiple Vitamins-Minerals (ADULT ONE DAILY GUMMIES) CHEW   Oral   Chew 2 each by mouth every morning.         Marland Kitchen spironolactone (ALDACTONE) 25 MG tablet   Oral   Take 25 mg by mouth every morning.         . promethazine (PHENERGAN) 25 MG tablet   Oral   Take 1 tablet (25 mg total) by mouth every 6 (six) hours as needed for nausea.   15 tablet   0   . QUEtiapine (SEROQUEL) 300 MG tablet   Oral   Take 600 mg by mouth at bedtime.           . sertraline  (ZOLOFT) 100 MG tablet   Oral   Take 100 mg by mouth daily.         . traZODone (DESYREL) 150 MG tablet   Oral   Take 150 mg by mouth at bedtime.           . ziprasidone (GEODON) 80 MG capsule   Oral   Take 160 mg by mouth at bedtime.           Marland Kitchen zolpidem (AMBIEN) 10 MG tablet   Oral   Take 10 mg by mouth at bedtime as needed. For sleep           BP 114/81  Pulse 88  Temp(Src) 98.5 F (36.9 C) (Oral)  Resp 20  Ht 5\' 5"  (1.651 m)  Wt 288 lb (130.636 kg)  BMI 47.93 kg/m2  SpO2 100%  LMP 07/12/2011  Physical Exam  Constitutional: She is oriented to person, place, and time. She appears well-developed.  HENT:  Head: Normocephalic.  Eyes: Conjunctivae and EOM are normal. No scleral icterus.  Neck: Neck supple. No thyromegaly present.  Cardiovascular: Normal rate and regular rhythm.  Exam reveals no gallop and no friction rub.   No murmur heard. Pulmonary/Chest: No stridor. She has no wheezes. She has no rales. She exhibits no tenderness.  Abdominal: She exhibits no distension. There is no tenderness. There is no rebound.  Musculoskeletal: Normal range of motion. She exhibits no edema.  Lymphadenopathy:    She has no cervical adenopathy.  Neurological: She is oriented to person, place, and time. Coordination normal.  Skin: No rash noted. No erythema.  Psychiatric: She has a normal mood and affect. Her behavior is normal.    ED Course  Procedures (including critical care time)  Labs Reviewed  CBC WITH DIFFERENTIAL - Abnormal; Notable for the following:    WBC 10.7 (*)    All other components within normal limits  BASIC METABOLIC PANEL - Abnormal; Notable for the following:    Creatinine, Ser 1.23 (*)    GFR calc non Af Amer 54 (*)    GFR calc Af Amer 63 (*)    All other components within normal limits   No results found.   1. Vomiting       MDM          Benny Lennert, MD 08/03/12 2111

## 2012-08-27 ENCOUNTER — Ambulatory Visit: Payer: Medicaid Other | Admitting: Cardiology

## 2012-08-27 ENCOUNTER — Encounter: Payer: Self-pay | Admitting: Cardiology

## 2012-08-27 ENCOUNTER — Ambulatory Visit (INDEPENDENT_AMBULATORY_CARE_PROVIDER_SITE_OTHER): Payer: Medicaid Other | Admitting: Cardiology

## 2012-08-27 VITALS — BP 122/76 | HR 95 | Ht 65.0 in | Wt 285.8 lb

## 2012-08-27 DIAGNOSIS — D649 Anemia, unspecified: Secondary | ICD-10-CM

## 2012-08-27 DIAGNOSIS — N182 Chronic kidney disease, stage 2 (mild): Secondary | ICD-10-CM

## 2012-08-27 DIAGNOSIS — I428 Other cardiomyopathies: Secondary | ICD-10-CM

## 2012-08-27 DIAGNOSIS — I10 Essential (primary) hypertension: Secondary | ICD-10-CM

## 2012-08-27 MED ORDER — LISINOPRIL 10 MG PO TABS: 10.0000 mg | ORAL_TABLET | Freq: Every day | ORAL | Status: AC

## 2012-08-27 MED ORDER — CARVEDILOL 12.5 MG PO TABS: 12.5000 mg | ORAL_TABLET | Freq: Two times a day (BID) | ORAL | Status: DC

## 2012-08-27 NOTE — Assessment & Plan Note (Addendum)
Renal function stable to improved. There is a component of cardiorenal syndrome;  we will attempt to balance medications to the extent possible to simultaneously prevent fluid overload and renal dysfunction.

## 2012-08-27 NOTE — Progress Notes (Deleted)
Name: Karla Anderson    DOB: Mar 26, 1971  Age: 42 y.o.  MR#: [id number]       PCP:  Avon Gully, MD      Insurance: Payor: MEDICAID [place] / Plan: MEDICAID [place] ACCESS / Product Type: *No Product type* /   CC:   No chief complaint on file.  No list VS Filed Vitals:   08/27/12 1414  BP: 122/76  Pulse: 95  Height: 5\' 5"  (1.651 m)  Weight: 285 lb 12 oz (129.615 kg)    Weights Current Weight  08/27/12 285 lb 12 oz (129.615 kg)  08/03/12 288 lb (130.636 kg)  05/11/12 291 lb (131.997 kg)    Blood Pressure  BP Readings from Last 3 Encounters:  08/27/12 122/76  08/03/12 114/81  05/11/12 126/70     Admit date:  (Not on file) Last encounter with RMR:  08/27/2012   Allergy Review of patient's allergies indicates no known allergies.  Current Outpatient Prescriptions  Medication Sig Dispense Refill  . carvedilol (COREG) 6.25 MG tablet Take 1 tablet (6.25 mg total) by mouth 2 (two) times daily with a meal.  60 tablet  6  . furosemide (LASIX) 40 MG tablet Take 40 mg by mouth 2 (two) times daily.      Marland Kitchen HYDROcodone-acetaminophen (NORCO) 5-325 MG per tablet Take 1 tablet by mouth every 6 (six) hours as needed. For pain.      Marland Kitchen lisinopril (PRINIVIL,ZESTRIL) 5 MG tablet Take 5 mg by mouth every morning.      . Multiple Vitamins-Minerals (ADULT ONE DAILY GUMMIES) CHEW Chew 2 each by mouth every morning.      . promethazine (PHENERGAN) 25 MG tablet Take 1 tablet (25 mg total) by mouth every 6 (six) hours as needed for nausea.  15 tablet  0  . QUEtiapine (SEROQUEL) 300 MG tablet Take 600 mg by mouth at bedtime.        . sertraline (ZOLOFT) 100 MG tablet Take 100 mg by mouth daily.      Marland Kitchen spironolactone (ALDACTONE) 25 MG tablet Take 25 mg by mouth every morning.      . traZODone (DESYREL) 150 MG tablet Take 150 mg by mouth at bedtime.        . ziprasidone (GEODON) 80 MG capsule Take 160 mg by mouth at bedtime.        Marland Kitchen zolpidem (AMBIEN) 10 MG tablet Take 10 mg by mouth at bedtime as needed.  For sleep       No current facility-administered medications for this visit.    Discontinued Meds:   There are no discontinued medications.  Patient Active Problem List   Diagnosis Date Noted  . ICD (implantable cardiac defibrillator) malfunction 04/06/2012  . Chronic systolic heart failure 04/06/2012  . Orthostatic hypotension 04/06/2012  . Chronic kidney disease, stage II (mild) 02/26/2012  . Anemia, normocytic normochromic 01/10/2012  . History of diagnostic tests 01/10/2012  . Cardiomyopathy, nonischemic   . Anxiety and depression   . Hypertension   . ICD-[employer] 10/02/2011  . Morbid obesity 11/18/2010  . Bipolar 1 disorder 11/18/2010    LABS    Component Value Date/Time   NA 137 08/03/2012 1808   NA 136 05/11/2012 1445   NA 137 04/10/2012 1639   K 4.5 08/03/2012 1808   K 5.0 05/11/2012 1445   K 5.5* 04/10/2012 1639   CL 108 08/03/2012 1808   CL 103 05/11/2012 1445   CL 101 04/10/2012 1639   CO2 21 08/03/2012 1808  CO2 24 05/11/2012 1445   CO2 28 04/10/2012 1639   GLUCOSE 99 08/03/2012 1808   GLUCOSE 91 05/11/2012 1445   GLUCOSE 82 04/10/2012 1639   BUN 14 08/03/2012 1808   BUN 23 05/11/2012 1445   BUN 34* 04/10/2012 1639   CREATININE 1.23* 08/03/2012 1808   CREATININE 1.58* 05/11/2012 1445   CREATININE 2.15* 04/10/2012 1639   CREATININE 2.50* 04/03/2012 1205   CREATININE 1.46* 11/10/2011 0545   CREATININE 1.52* 11/09/2011 1744   CALCIUM 8.8 08/03/2012 1808   CALCIUM 8.5 05/11/2012 1445   CALCIUM 9.2 04/10/2012 1639   GFRNONAA 54* 08/03/2012 1808   GFRNONAA 44* 11/10/2011 0545   GFRNONAA 42* 11/09/2011 1744   GFRAA 63* 08/03/2012 1808   GFRAA 51* 11/10/2011 0545   GFRAA 49* 11/09/2011 1744   CMP     Component Value Date/Time   NA 137 08/03/2012 1808   K 4.5 08/03/2012 1808   CL 108 08/03/2012 1808   CO2 21 08/03/2012 1808   GLUCOSE 99 08/03/2012 1808   BUN 14 08/03/2012 1808   CREATININE 1.23* 08/03/2012 1808   CREATININE 1.58* 05/11/2012 1445   CALCIUM 8.8 08/03/2012 1808   PROT 7.2  05/11/2012 1445   ALBUMIN 4.1 05/11/2012 1445   AST 16 05/11/2012 1445   ALT 9 05/11/2012 1445   ALKPHOS 119* 05/11/2012 1445   BILITOT 0.3 05/11/2012 1445   GFRNONAA 54* 08/03/2012 1808   GFRAA 63* 08/03/2012 1808       Component Value Date/Time   WBC 10.7* 08/03/2012 1808   WBC 11.0* 05/11/2012 1445   WBC 7.7 04/10/2012 1639   HGB 12.2 08/03/2012 1808   HGB 11.0* 05/11/2012 1445   HGB 10.7* 04/10/2012 1639   HCT 36.7 08/03/2012 1808   HCT 33.4* 05/11/2012 1445   HCT 32.6* 04/10/2012 1639   MCV 88.0 08/03/2012 1808   MCV 91.3 05/11/2012 1445   MCV 90.1 04/10/2012 1639    Lipid Panel     Component Value Date/Time   CHOL 76 07/04/2011 0552   TRIG 90 07/04/2011 0552   HDL 25* 07/04/2011 0552   CHOLHDL 3.0 07/04/2011 0552   VLDL 18 07/04/2011 0552   LDLCALC 33 07/04/2011 0552    ABG No results found for this basename: phart, pco2, pco2art, po2, po2art, hco3, tco2, acidbasedef, o2sat     Lab Results  Component Value Date   TSH 2.751 07/03/2011   BNP (last 3 results)  Recent Labs  11/08/11 2057 11/11/11 0900  PROBNP 1445.0* 2284.0*   Cardiac Panel (last 3 results) No results found for this basename: CKTOTAL, CKMB, TROPONINI, RELINDX,  in the last 72 hours  Iron/TIBC/Ferritin    Component Value Date/Time   IRON 69 11/11/2011 0900   TIBC 342 11/11/2011 0900   FERRITIN 131 11/11/2011 0900     EKG Orders placed in visit on 08/27/12  . EKG 12-LEAD     Prior Assessment and Plan Problem List as of 08/27/2012   Morbid obesity   Last Assessment & Plan   03/31/2012 Office Visit Written 03/31/2012  2:37 PM by Kathlen Brunswick, MD     Slight weight loss since her last visit.  Concerted effort at weight loss will be made once cardiac status is stable.    Bipolar 1 disorder   Cardiomyopathy, nonischemic   Last Assessment & Plan   05/11/2012 Office Visit Written 05/11/2012  2:31 PM by Kathlen Brunswick, MD     Functional status is class II. She tolerates only  very low doses of appropriate medication,  but has had nearly a year of biventricular pacing, which may have resulted in substantial improvement in LV systolic function. Echocardiography will be repeated. Metabolic profile will be monitored to reassess renal function and to maintain normal electrolytes. Low potassium diet recommended. Aldactone dosage will be decreased to 12.5 mg per day.    Anxiety and depression   Hypertension   Last Assessment & Plan   03/31/2012 Office Visit Written 03/31/2012  2:40 PM by Kathlen Brunswick, MD     Blood pressure elevation is certainly not an issue at present and likely will no longer be, as treatment for cardiomyopathy will adequately manage her mild hypertension as well.    ICD-[employer]   Last Assessment & Plan   05/11/2012 Office Visit Written 05/11/2012  2:34 PM by Kathlen Brunswick, MD     Despite recent coronary sinus lead replacement, biventricular pacing has been maintained for the past 8 months. LV ejection fraction will be reassessed.    Anemia, normocytic normochromic   Last Assessment & Plan   05/11/2012 Office Visit Edited 05/11/2012  2:30 PM by Kathlen Brunswick, MD     Patient has had a long-standing and unexplained anemia; repeat CBC with iron studies will be obtained.  Additional assessment will be undertaken based upon those results.    History of diagnostic tests   Chronic kidney disease, stage II (mild)   Last Assessment & Plan   05/11/2012 Office Visit Written 05/11/2012  2:32 PM by Kathlen Brunswick, MD     Most likely intrinsic renal function is normal or near-normal, and creatinine elevation is primarily due to cardiorenal syndrome. We will continue to monitor and adjust medication to minimize adverse renal effects.    ICD (implantable cardiac defibrillator) malfunction   Last Assessment & Plan   04/06/2012 Office Visit Written 04/06/2012 10:30 AM by Marinus Maw, MD     While her device is technically working ok. She has clear dislodgement of the LV lead into the CS. If  I new that the lead would stay in place I would not recommend revision. Unfortunately, it is likely to retract further. Will schedule her for LV lead revision. I have discussed the risks/benefits/goals/expectations of the procedure with the patient and she wishes to proceed.    Chronic systolic heart failure   Last Assessment & Plan   05/01/2012 Office Visit Written 05/01/2012 12:24 PM by Marinus Maw, MD     Her chronic systolic heart failure appears to be class II. I've encouraged the patient to reduce her sodium intake, and we will make adjustments in her medications.    Orthostatic hypotension   Last Assessment & Plan   05/11/2012 Office Visit Written 05/11/2012  2:35 PM by Kathlen Brunswick, MD     Blood pressure is higher than at previous visits without significant orthostatic change; however, patient reports at least occasional very low systolic blood pressures at home. Increase salt intake and holding of other medications when that occurs has been recommended to her.        Imaging: No results found.

## 2012-08-27 NOTE — Assessment & Plan Note (Signed)
Anemia has resolved without a specific diagnosis ever having been established. Continued monitoring is appropriate.

## 2012-08-27 NOTE — Assessment & Plan Note (Signed)
Patient remains asymptomatic despite moderately severe left ventricular dysfunction. We will continue to titrate medication, increase in carvedilol to 12.5 mg twice a day and lisinopril to 10 mg per day. Dose of Aldactone was decreased to 12.5 mg per day as the result of borderline hyperkalemia in 05/2012.

## 2012-08-27 NOTE — Patient Instructions (Addendum)
Your physician recommends that you schedule a follow-up appointment in: 6 MONTHS  Your physician recommends that you return for lab work in: 3 MONTHS (BMET) Your physician recommends that you have follow up lab work, we will mail you a reminder letter to alert you when to go [employer], located across the street from our office.   Your physician has recommended you make the following change in your medication:   1) INCREASE COREG TO 12.5MG  ONE TAB TWICE DAILY 2) INCREASE LISINOPRIL 10MG  ONCE DAILY

## 2012-08-27 NOTE — Progress Notes (Addendum)
Patient ID: SEMIRA STOLTZFUS, female   DOB: May 22, 1970, 42 y.o.   MRN: [id number]  HPI: Schedule return visit for continued assessment and treatment of nonischemic cardiomyopathy. This nice young woman has done quite well and is essentially asymptomatic with sedentary lifestyle. She notes some easy fatigability when walking or doing housework.  Current Outpatient Prescriptions  Medication Sig Dispense Refill  . carvedilol (COREG) 6.25 MG tablet Take 1 tablet (6.25 mg total) by mouth 2 (two) times daily with a meal.  60 tablet  6  . furosemide (LASIX) 40 MG tablet Take 40 mg by mouth 2 (two) times daily.      Marland Kitchen HYDROcodone-acetaminophen (NORCO) 5-325 MG per tablet Take 1 tablet by mouth every 6 (six) hours as needed. For pain.      Marland Kitchen lisinopril (PRINIVIL,ZESTRIL) 5 MG tablet Take 5 mg by mouth every morning.      . Multiple Vitamins-Minerals (ADULT ONE DAILY GUMMIES) CHEW Chew 2 each by mouth every morning.      . promethazine (PHENERGAN) 25 MG tablet Take 1 tablet (25 mg total) by mouth every 6 (six) hours as needed for nausea.  15 tablet  0  . QUEtiapine (SEROQUEL) 300 MG tablet Take 600 mg by mouth at bedtime.        . sertraline (ZOLOFT) 100 MG tablet Take 100 mg by mouth daily.      Marland Kitchen spironolactone (ALDACTONE) 25 MG tablet Take 25 mg by mouth every morning.      . traZODone (DESYREL) 150 MG tablet Take 150 mg by mouth at bedtime.        . ziprasidone (GEODON) 80 MG capsule Take 160 mg by mouth at bedtime.        Marland Kitchen zolpidem (AMBIEN) 10 MG tablet Take 10 mg by mouth at bedtime as needed. For sleep       No current facility-administered medications for this visit.   No Known Allergies   Past medical history, social history, and family history reviewed and updated.  ROS: Denies chest pain, orthopnea, PND, pedal edema, palpitations, lightheadedness or syncope. She has experienced no AICD discharges. All other systems reviewed and are negative.  PHYSICAL EXAM: BP 122/76  Pulse 95  Ht 5\' 5"   (1.651 m)  Wt 129.615 kg (285 lb 12 oz)  BMI 47.55 kg/m2  LMP 07/12/2011;  Body mass index is 47.55 kg/(m^2). General-Well developed; no acute distress Body habitus-markedly overweight Neck-No JVD; no carotid bruits Lungs-clear lung fields; resonant to percussion; decreased breath sounds throughout Cardiovascular-normal PMI; normal S1 and S2; no third heart sound Abdomen-normal bowel sounds; soft and non-tender without masses or organomegaly Musculoskeletal-No deformities, no cyanosis or clubbing Neurologic-Normal cranial nerves; symmetric strength and tone Skin-Warm, no significant lesions Extremities-distal pulses intact; trace edema  EKG: Normal sinus rhythm with atrial synchronous ventricular pacing; right superior axis  [place] Bing, MD 08/27/2012  2:44 PM  ASSESSMENT AND PLAN

## 2012-08-27 NOTE — Assessment & Plan Note (Signed)
Blood pressure control has been excellent in 2014 with only a single SBP determination greater than 140 and no elevated diastolic pressures.

## 2012-10-15 ENCOUNTER — Encounter: Payer: Self-pay | Admitting: Internal Medicine

## 2012-10-15 ENCOUNTER — Ambulatory Visit (INDEPENDENT_AMBULATORY_CARE_PROVIDER_SITE_OTHER): Payer: Medicaid Other | Admitting: Internal Medicine

## 2012-10-15 VITALS — BP 106/72 | HR 70 | Ht 65.0 in | Wt 293.0 lb

## 2012-10-15 DIAGNOSIS — Z95810 Presence of automatic (implantable) cardiac defibrillator: Secondary | ICD-10-CM

## 2012-10-15 DIAGNOSIS — I5022 Chronic systolic (congestive) heart failure: Secondary | ICD-10-CM

## 2012-10-15 LAB — ICD DEVICE OBSERVATION
AL IMPEDENCE ICD: 590 Ohm
AL THRESHOLD: 0.8 V
HV IMPEDENCE: 90 Ohm
LV LEAD IMPEDENCE ICD: 1521 Ohm
LV LEAD THRESHOLD: 0.7 V
RV LEAD IMPEDENCE ICD: 559 Ohm
RV LEAD THRESHOLD: 0.7 V

## 2012-10-15 NOTE — Patient Instructions (Signed)
Your physician recommends that you schedule a follow-up appointment in: 1 YEAR WITH DR Ladona Ridgel AND DEVICE CHECK IN 3 MONTHS  Your physician recommends that you continue on your current medications as directed. Please refer to the Current Medication list given to you today.

## 2012-10-15 NOTE — Assessment & Plan Note (Signed)
Her [employer] biventricular ICD is working normally. We'll plan to recheck in several months. 

## 2012-10-15 NOTE — Assessment & Plan Note (Signed)
Her heart failure symptoms are class II. No change in medical therapy. I discuss importance a low-sodium diet. I've also discuss importance of weight loss.

## 2012-10-15 NOTE — Progress Notes (Signed)
HPI Karla Anderson returns today for followup. She is a very pleasant 42 year old woman with a nonischemic cardiomyopathy, chronic systolic heart failure, left bundle branch block, and morbid obesity. In the interim, she has had occasional dizzy spells but no chest pain. She has class II heart failure symptoms. She continues to stroke overweight, having gained over 10 pounds. She denies dietary indiscretion however. No syncope. No Known Allergies   Current Outpatient Prescriptions  Medication Sig Dispense Refill  . carvedilol (COREG) 12.5 MG tablet Take 1 tablet (12.5 mg total) by mouth 2 (two) times daily.  180 tablet  3  . furosemide (LASIX) 40 MG tablet Take 40 mg by mouth 2 (two) times daily.      Marland Kitchen HYDROcodone-acetaminophen (NORCO) 5-325 MG per tablet Take 1 tablet by mouth every 6 (six) hours as needed. For pain.      Marland Kitchen lisinopril (PRINIVIL,ZESTRIL) 10 MG tablet Take 1 tablet (10 mg total) by mouth daily.  90 tablet  3  . Multiple Vitamins-Minerals (ADULT ONE DAILY GUMMIES) CHEW Chew 2 each by mouth every morning.      . promethazine (PHENERGAN) 25 MG tablet Take 1 tablet (25 mg total) by mouth every 6 (six) hours as needed for nausea.  15 tablet  0  . QUEtiapine (SEROQUEL XR) 300 MG 24 hr tablet Take 300 mg by mouth at bedtime.      Marland Kitchen spironolactone (ALDACTONE) 25 MG tablet Take 12.5 mg by mouth every morning.       . traZODone (DESYREL) 150 MG tablet Take 150 mg by mouth at bedtime.        Marland Kitchen zolpidem (AMBIEN) 10 MG tablet Take 10 mg by mouth at bedtime as needed. For sleep       No current facility-administered medications for this visit.     Past Medical History  Diagnosis Date  . Wide-complex tachycardia     Rate related LBBB - seen by Dr. Graciela Husbands and Dr. Dietrich Pates in the past  . Cardiomyopathy, nonischemic     + murmur; h/o CHF, chest pain; EF-10%  . Anxiety and depression     - sleep study in 12/2011  . Bipolar 1 disorder   . Hypertension   . ICD (implantable cardiac  defibrillator) in place 10/02/11    2013; syncope postimplantation attributed to dehydration  . History of blood transfusion 10/2010  . Degenerative joint disease     Bilateral knee; back    ROS:   All systems reviewed and negative except as noted in the HPI.   Past Surgical History  Procedure Laterality Date  . Cesarean section  1997  . Tubal ligation  1999    Reversal-2002  . Esophagogastroduodenoscopy  11/19/2010    Rehman, MD  . Laparotomy  11/19/2010    Fabio Bering  . Gastrectomy  11/19/2010    "  . Gastrojejunostomy  11/19/2010    "  . Cholecystectomy  11/09/2010    Fabio Bering  . Neuroplasty / transposition median nerve at carpal tunnel bilateral  ~ 2008  . Cardiac defibrillator placement  10/02/11     Family History  Problem Relation Age of Onset  . Stroke Father     Died recently, Age 37  . Heart failure Mother   . Diabetes Sister   . Pneumonia Father   . Coronary artery disease Father   . Parkinsonism Mother      History   Social History  . Marital Status: Married    Spouse  Name: N/A    Number of Children: N/A  . Years of Education: N/A   Occupational History  . Not on file.   Social History Main Topics  . Smoking status: Never Smoker   . Smokeless tobacco: Never Used  . Alcohol Use: No  . Drug Use: No  . Sexually Active: Yes    Birth Control/ Protection: None, Surgical   Other Topics Concern  . Not on file   Social History Narrative  . No narrative on file     BP 106/72  Pulse 70  Ht 5\' 5"  (1.651 m)  Wt 293 lb (132.904 kg)  BMI 48.76 kg/m2  Physical Exam:  Morbidly obese appearing, hirsute, middle-age woman, NAD HEENT: Unremarkable Neck:  7 cm JVD, no thyromegally Back:  No CVA tenderness Lungs:  Clear with no wheezes, rales, or rhonchi HEART:  Regular rate rhythm, no murmurs, no rubs, no clicks Abd:  soft, obese,positive bowel sounds, no organomegally, no rebound, no guarding Ext:  2 plus pulses, no edema, no cyanosis, no  clubbing Skin:  No rashes no nodules Neuro:  CN II through XII intact, motor grossly intact  DEVICE  Normal device function.  See PaceArt for details.   Assess/Plan:

## 2012-10-16 ENCOUNTER — Encounter: Payer: Self-pay | Admitting: Internal Medicine

## 2012-10-27 ENCOUNTER — Encounter: Payer: Self-pay | Admitting: *Deleted

## 2012-10-27 ENCOUNTER — Other Ambulatory Visit: Payer: Self-pay | Admitting: *Deleted

## 2012-10-27 DIAGNOSIS — N182 Chronic kidney disease, stage 2 (mild): Secondary | ICD-10-CM

## 2012-10-27 DIAGNOSIS — I428 Other cardiomyopathies: Secondary | ICD-10-CM

## 2012-10-27 DIAGNOSIS — I10 Essential (primary) hypertension: Secondary | ICD-10-CM

## 2012-10-27 DIAGNOSIS — D649 Anemia, unspecified: Secondary | ICD-10-CM

## 2012-11-04 LAB — BASIC METABOLIC PANEL WITH GFR
BUN: 23 mg/dL (ref 6–23)
CO2: 24 meq/L (ref 19–32)
Calcium: 7.9 mg/dL — ABNORMAL LOW (ref 8.4–10.5)
Chloride: 108 meq/L (ref 96–112)
Creat: 1.68 mg/dL — ABNORMAL HIGH (ref 0.50–1.10)
Glucose, Bld: 104 mg/dL — ABNORMAL HIGH (ref 70–99)
Potassium: 4.2 meq/L (ref 3.5–5.3)
Sodium: 137 meq/L (ref 135–145)

## 2012-11-08 ENCOUNTER — Encounter: Payer: Self-pay | Admitting: Cardiology

## 2012-11-10 NOTE — Addendum Note (Signed)
Addended by: Derry Lory A on: 11/10/2012 10:30 AM   Modules accepted: Orders

## 2012-12-19 ENCOUNTER — Emergency Department: Payer: Self-pay | Admitting: Emergency Medicine

## 2012-12-19 LAB — BASIC METABOLIC PANEL
Calcium, Total: 8.2 mg/dL — ABNORMAL LOW (ref 8.5–10.1)
Chloride: 109 mmol/L — ABNORMAL HIGH (ref 98–107)
Creatinine: 1.71 mg/dL — ABNORMAL HIGH (ref 0.60–1.30)
EGFR (Non-African Amer.): 37 — ABNORMAL LOW
Potassium: 4.6 mmol/L (ref 3.5–5.1)

## 2012-12-19 LAB — CBC
HGB: 10.4 g/dL — ABNORMAL LOW (ref 12.0–16.0)
MCH: 30.3 pg (ref 26.0–34.0)
MCHC: 33.5 g/dL (ref 32.0–36.0)
MCV: 90 fL (ref 80–100)
Platelet: 202 10*3/uL (ref 150–440)
RBC: 3.44 10*6/uL — ABNORMAL LOW (ref 3.80–5.20)
RDW: 14.2 % (ref 11.5–14.5)
WBC: 7.9 10*3/uL (ref 3.6–11.0)

## 2012-12-20 LAB — TSH: Thyroid Stimulating Horm: 2.18 u[IU]/mL

## 2012-12-21 ENCOUNTER — Encounter: Payer: Self-pay | Admitting: Cardiovascular Disease

## 2012-12-21 ENCOUNTER — Ambulatory Visit (INDEPENDENT_AMBULATORY_CARE_PROVIDER_SITE_OTHER): Payer: Medicaid Other | Admitting: Cardiovascular Disease

## 2012-12-21 VITALS — BP 104/69 | HR 80 | Ht 65.5 in | Wt 302.3 lb

## 2012-12-21 DIAGNOSIS — I951 Orthostatic hypotension: Secondary | ICD-10-CM

## 2012-12-21 DIAGNOSIS — R079 Chest pain, unspecified: Secondary | ICD-10-CM

## 2012-12-21 DIAGNOSIS — I5022 Chronic systolic (congestive) heart failure: Secondary | ICD-10-CM

## 2012-12-21 NOTE — Patient Instructions (Addendum)
Continue same medications.  Take with your psychiatrist about switching Trazodone and Seroquel to something else due to orthostatic hypotension and syncope.   Follow up in 3 months.

## 2012-12-22 ENCOUNTER — Encounter: Payer: Self-pay | Admitting: Cardiovascular Disease

## 2012-12-22 NOTE — Assessment & Plan Note (Signed)
This has been associated with syncope. She is orthostatic today. I advised her to discuss with her psychiatrist about switching trazodone and Seroquel to something else given that both these medications but associated with orthostatic hypotension and syncope. She is obviously on other cardiac medications that can lead to this. However, these are essential for her cardiac condition.

## 2012-12-22 NOTE — Assessment & Plan Note (Signed)
She appears to be euvolemic on current dose of Lasix and spironolactone. She is currently New [place] Heart Association class II. She is on good medical therapy which will be continued.

## 2012-12-22 NOTE — Assessment & Plan Note (Signed)
This is followed by Dr. Ladona Ridgel.

## 2012-12-22 NOTE — Progress Notes (Signed)
HPI  This is a 42 year old female who is here today to establish cardiovascular care with me. She use to followup with Dr. Dietrich Pates in [place] but she moved to [place]. She has known history of nonischemic cardiomyopathy diagnosed in 2012, chronic systolic heart failure, left bundle branch block, and morbid obesity. She is status post ICD CRT placement managed by Dr. Ladona Ridgel. I don't see previous ischemic cardiac evaluation. Cardiomyopathy was presumed to be nonischemic.  She has been doing reasonably well. However, she has chronic palpitations as well as orthostatic dizziness and syncope. She is on trazodone and Seroquel.  She has been under stress lately after the death of her sister at the age of 50 from presumed myocardial infarction.   No Known Allergies   Current Outpatient Prescriptions on File Prior to Visit  Medication Sig Dispense Refill  . carvedilol (COREG) 12.5 MG tablet Take 1 tablet (12.5 mg total) by mouth 2 (two) times daily.  180 tablet  3  . furosemide (LASIX) 40 MG tablet Take 40 mg by mouth 2 (two) times daily.      Marland Kitchen lisinopril (PRINIVIL,ZESTRIL) 10 MG tablet Take 1 tablet (10 mg total) by mouth daily.  90 tablet  3  . Multiple Vitamins-Minerals (ADULT ONE DAILY GUMMIES) CHEW Chew 2 each by mouth every morning.      . promethazine (PHENERGAN) 25 MG tablet Take 1 tablet (25 mg total) by mouth every 6 (six) hours as needed for nausea.  15 tablet  0  . QUEtiapine (SEROQUEL XR) 300 MG 24 hr tablet Take 300 mg by mouth at bedtime.      Marland Kitchen spironolactone (ALDACTONE) 25 MG tablet Take 12.5 mg by mouth every morning.       . traZODone (DESYREL) 150 MG tablet Take 150 mg by mouth at bedtime.         No current facility-administered medications on file prior to visit.     Past Medical History  Diagnosis Date  . Wide-complex tachycardia     Rate related LBBB - seen by Dr. Graciela Husbands and Dr. Dietrich Pates in the past  . Cardiomyopathy, nonischemic     + murmur; h/o CHF, chest  pain; EF-10%  . Anxiety and depression     - sleep study in 12/2011  . Bipolar 1 disorder   . Hypertension   . ICD (implantable cardiac defibrillator) in place 10/02/11    2013; syncope postimplantation attributed to dehydration  . History of blood transfusion 10/2010  . Degenerative joint disease     Bilateral knee; back     Past Surgical History  Procedure Laterality Date  . Cesarean section  1997  . Tubal ligation  1999    Reversal-2002  . Esophagogastroduodenoscopy  11/19/2010    Rehman, MD  . Laparotomy  11/19/2010    Fabio Bering  . Gastrectomy  11/19/2010    "  . Gastrojejunostomy  11/19/2010    "  . Cholecystectomy  11/09/2010    Fabio Bering  . Neuroplasty / transposition median nerve at carpal tunnel bilateral  ~ 2008  . Cardiac defibrillator placement  10/02/11  . Cardiac catheterization       Family History  Problem Relation Age of Onset  . Stroke Father     Died recently, Age 72  . Heart failure Mother   . Diabetes Sister   . Pneumonia Father   . Coronary artery disease Father   . Parkinsonism Mother      History  Social History  . Marital Status: Married    Spouse Name: N/A    Number of Children: N/A  . Years of Education: N/A   Occupational History  . Not on file.   Social History Main Topics  . Smoking status: Never Smoker   . Smokeless tobacco: Never Used  . Alcohol Use: No  . Drug Use: No  . Sexual Activity: Yes    Birth Control/ Protection: None, Surgical   Other Topics Concern  . Not on file   Social History Narrative  . No narrative on file      PHYSICAL EXAM   BP 104/69  Pulse 80  Ht 5' 5.5" (1.664 m)  Wt 302 lb 5 oz (137.128 kg)  BMI 49.52 kg/m2 Constitutional: She is oriented to person, place, and time. She appears well-developed and well-nourished. No distress.  HENT: No nasal discharge.  Head: Normocephalic and atraumatic.  Eyes: Pupils are equal and round. Right eye exhibits no discharge. Left eye exhibits no  discharge.  Neck: Normal range of motion. Neck supple. No JVD present. No thyromegaly present.  Cardiovascular: Normal rate, regular rhythm, normal heart sounds. Exam reveals no gallop and no friction rub. No murmur heard.  Pulmonary/Chest: Effort normal and breath sounds normal. No stridor. No respiratory distress. She has no wheezes. She has no rales. She exhibits no tenderness.  Abdominal: Soft. Bowel sounds are normal. She exhibits no distension. There is no tenderness. There is no rebound and no guarding.  Musculoskeletal: Normal range of motion. She exhibits no edema and no tenderness.  Neurological: She is alert and oriented to person, place, and time. Coordination normal.  Skin: Skin is warm and dry. No rash noted. She is not diaphoretic. No erythema. No pallor.  Psychiatric: She has a normal mood and affect. Her behavior is normal. Judgment and thought content normal.     [id number] sinus rhythm with ventricular paced rhythm   ASSESSMENT AND PLAN

## 2012-12-29 ENCOUNTER — Emergency Department: Payer: Self-pay | Admitting: Internal Medicine

## 2013-01-04 ENCOUNTER — Emergency Department: Payer: Self-pay | Admitting: Emergency Medicine

## 2013-01-04 ENCOUNTER — Ambulatory Visit (INDEPENDENT_AMBULATORY_CARE_PROVIDER_SITE_OTHER): Payer: Medicaid Other | Admitting: *Deleted

## 2013-01-04 DIAGNOSIS — I428 Other cardiomyopathies: Secondary | ICD-10-CM

## 2013-01-04 DIAGNOSIS — I5022 Chronic systolic (congestive) heart failure: Secondary | ICD-10-CM

## 2013-01-04 LAB — BASIC METABOLIC PANEL
Anion Gap: 7 (ref 7–16)
BUN: 15 mg/dL (ref 7–18)
Chloride: 112 mmol/L — ABNORMAL HIGH (ref 98–107)
Co2: 19 mmol/L — ABNORMAL LOW (ref 21–32)
Creatinine: 1.43 mg/dL — ABNORMAL HIGH (ref 0.60–1.30)
EGFR (Non-African Amer.): 45 — ABNORMAL LOW
Glucose: 105 mg/dL — ABNORMAL HIGH (ref 65–99)
Osmolality: 277 (ref 275–301)

## 2013-01-04 LAB — ICD DEVICE OBSERVATION
AL AMPLITUDE: 7.4 mv
ATRIAL PACING ICD: 1 pct
DEV-0020ICD: NEGATIVE
DEVICE MODEL ICD: 106874
LV LEAD AMPLITUDE: 23.2 mv
LV LEAD IMPEDENCE ICD: 1287 Ohm
VENTRICULAR PACING ICD: 100 pct

## 2013-01-04 LAB — CBC WITH DIFFERENTIAL/PLATELET
Basophil #: 0.1 10*3/uL (ref 0.0–0.1)
Eosinophil #: 0.1 10*3/uL (ref 0.0–0.7)
HCT: 29.9 % — ABNORMAL LOW (ref 35.0–47.0)
HGB: 10.1 g/dL — ABNORMAL LOW (ref 12.0–16.0)
Lymphocyte %: 18.6 %
MCH: 30.6 pg (ref 26.0–34.0)
MCHC: 33.7 g/dL (ref 32.0–36.0)
MCV: 91 fL (ref 80–100)
Monocyte #: 0.6 x10 3/mm (ref 0.2–0.9)
Neutrophil %: 73.1 %
Platelet: 305 10*3/uL (ref 150–440)
RBC: 3.3 10*6/uL — ABNORMAL LOW (ref 3.80–5.20)
RDW: 14.2 % (ref 11.5–14.5)
WBC: 9.5 10*3/uL (ref 3.6–11.0)

## 2013-01-04 NOTE — Progress Notes (Signed)
ICD check in office. 

## 2013-01-22 ENCOUNTER — Telehealth: Payer: Self-pay

## 2013-01-22 ENCOUNTER — Emergency Department: Payer: Self-pay | Admitting: Emergency Medicine

## 2013-01-22 ENCOUNTER — Telehealth: Payer: Self-pay | Admitting: Internal Medicine

## 2013-01-22 LAB — BASIC METABOLIC PANEL
Anion Gap: 5 — ABNORMAL LOW (ref 7–16)
BUN: 25 mg/dL — ABNORMAL HIGH (ref 7–18)
Chloride: 111 mmol/L — ABNORMAL HIGH (ref 98–107)
Co2: 22 mmol/L (ref 21–32)
Creatinine: 1.7 mg/dL — ABNORMAL HIGH (ref 0.60–1.30)
EGFR (African American): 43 — ABNORMAL LOW
Glucose: 91 mg/dL (ref 65–99)
Osmolality: 280 (ref 275–301)
Sodium: 138 mmol/L (ref 136–145)

## 2013-01-22 LAB — CBC WITH DIFFERENTIAL/PLATELET
Basophil #: 0.1 10*3/uL (ref 0.0–0.1)
Eosinophil %: 1.8 %
HCT: 28.5 % — ABNORMAL LOW (ref 35.0–47.0)
HGB: 9.7 g/dL — ABNORMAL LOW (ref 12.0–16.0)
Lymphocyte %: 26.9 %
Monocyte #: 0.6 x10 3/mm (ref 0.2–0.9)
Monocyte %: 8.6 %
Neutrophil #: 3.9 10*3/uL (ref 1.4–6.5)
Neutrophil %: 61.7 %
Platelet: 196 10*3/uL (ref 150–440)
RDW: 14.8 % — ABNORMAL HIGH (ref 11.5–14.5)
WBC: 6.4 10*3/uL (ref 3.6–11.0)

## 2013-01-22 NOTE — Telephone Encounter (Signed)
Spoke w/ pt.  Defibrillator implanted 04/15/12 per pt.  Pt reports "pain and a squishy feeling on the left side near my breast where the incision is". States that she feels a "thump" and pain is constant x several days.  States "it hurts" several times during conversation. Reports that she was in another doctor's office for "an infection on my back.  They said if the infection moved to my pacemaker, then I'll have to have it taken out.  I think it's moved to my pacemaker." She denies fever.  Unsure of redness or swelling, as "I can't see it." Pt has no transportation to come to office, but is very concerned.  Please advise.  Thank you.

## 2013-01-22 NOTE — Telephone Encounter (Signed)
Spoke w/ pt.  Informed her that I had spoken w/ the device clinic and we all agree that she needs to seek care immediately, as infection may have spread. She states that she has no transportation at the moment. Reinforced seriousness of situation. Pt will contact husband and if he cannot get her to urgent care or ED soon, she will call 911. Pt is crying and verbalizes that will seek help immediately.

## 2013-01-22 NOTE — Telephone Encounter (Signed)
New Problem  Per Mindi/// Pain and squishy feeling near Defib// please call back ASAP!

## 2013-01-22 NOTE — Telephone Encounter (Signed)
Spoke w/ gentleman who answered Mrs. Dubie's phone.  When I asked to speak to Calla, he stated that she was not there, that the ambulance came and picked her up.

## 2013-01-25 ENCOUNTER — Encounter: Payer: Self-pay | Admitting: Internal Medicine

## 2013-01-26 ENCOUNTER — Telehealth: Payer: Self-pay

## 2013-01-26 NOTE — Telephone Encounter (Signed)
error 

## 2013-02-15 ENCOUNTER — Encounter: Payer: Self-pay | Admitting: *Deleted

## 2013-02-15 ENCOUNTER — Other Ambulatory Visit: Payer: Self-pay | Admitting: *Deleted

## 2013-02-15 DIAGNOSIS — I428 Other cardiomyopathies: Secondary | ICD-10-CM

## 2013-02-15 DIAGNOSIS — D649 Anemia, unspecified: Secondary | ICD-10-CM

## 2013-02-15 DIAGNOSIS — N182 Chronic kidney disease, stage 2 (mild): Secondary | ICD-10-CM

## 2013-02-15 DIAGNOSIS — I10 Essential (primary) hypertension: Secondary | ICD-10-CM

## 2013-02-18 ENCOUNTER — Other Ambulatory Visit: Payer: Medicaid Other

## 2013-02-22 ENCOUNTER — Ambulatory Visit (INDEPENDENT_AMBULATORY_CARE_PROVIDER_SITE_OTHER): Payer: Medicaid Other | Admitting: *Deleted

## 2013-02-22 DIAGNOSIS — I428 Other cardiomyopathies: Secondary | ICD-10-CM

## 2013-02-22 DIAGNOSIS — I5022 Chronic systolic (congestive) heart failure: Secondary | ICD-10-CM

## 2013-02-22 DIAGNOSIS — I10 Essential (primary) hypertension: Secondary | ICD-10-CM

## 2013-02-23 ENCOUNTER — Ambulatory Visit: Payer: Medicaid Other | Admitting: Cardiovascular Disease

## 2013-02-23 LAB — BASIC METABOLIC PANEL
BUN/Creatinine Ratio: 15 (ref 9–23)
BUN: 22 mg/dL (ref 6–24)
CO2: 20 mmol/L (ref 18–29)
Chloride: 107 mmol/L (ref 97–108)
Creatinine, Ser: 1.42 mg/dL — ABNORMAL HIGH (ref 0.57–1.00)
GFR calc Af Amer: 53 mL/min/{1.73_m2} — ABNORMAL LOW (ref >59)
GFR calc non Af Amer: 46 mL/min/{1.73_m2} — ABNORMAL LOW (ref >59)
Glucose: 86 mg/dL (ref 65–99)
Sodium: 140 mmol/L (ref 134–144)

## 2013-03-10 ENCOUNTER — Telehealth: Payer: Self-pay

## 2013-03-10 NOTE — Telephone Encounter (Signed)
Attempted to reach patient to return call about lab results at all three numbers we have listed for her. The mother and husband said they had no idea how to reach her. They said they would give her the message to call me if she happened to call them.

## 2013-03-10 NOTE — Telephone Encounter (Signed)
Pt would like lab results.  

## 2013-03-11 ENCOUNTER — Telehealth: Payer: Self-pay | Admitting: *Deleted

## 2013-03-11 ENCOUNTER — Inpatient Hospital Stay: Payer: Self-pay | Admitting: Psychiatry

## 2013-03-11 ENCOUNTER — Other Ambulatory Visit: Payer: Self-pay | Admitting: *Deleted

## 2013-03-11 DIAGNOSIS — Z79899 Other long term (current) drug therapy: Secondary | ICD-10-CM

## 2013-03-11 DIAGNOSIS — I5022 Chronic systolic (congestive) heart failure: Secondary | ICD-10-CM

## 2013-03-11 LAB — DRUG SCREEN, URINE
Amphetamines, Ur Screen: NEGATIVE (ref ?–1000)
Barbiturates, Ur Screen: NEGATIVE (ref ?–200)
Cannabinoid 50 Ng, Ur ~~LOC~~: NEGATIVE (ref ?–50)
Cocaine Metabolite,Ur ~~LOC~~: NEGATIVE (ref ?–300)
MDMA (Ecstasy)Ur Screen: POSITIVE (ref ?–500)
Tricyclic, Ur Screen: NEGATIVE (ref ?–1000)

## 2013-03-11 LAB — ETHANOL
Ethanol %: <0.003 % (ref 0.000–0.080)
Ethanol: <3 mg/dL

## 2013-03-11 LAB — CBC
HCT: 33.7 % — ABNORMAL LOW (ref 35.0–47.0)
HGB: 11 g/dL — ABNORMAL LOW (ref 12.0–16.0)
MCHC: 32.5 g/dL (ref 32.0–36.0)
MCV: 89 fL (ref 80–100)
RBC: 3.77 10*6/uL — ABNORMAL LOW (ref 3.80–5.20)
RDW: 14.1 % (ref 11.5–14.5)
WBC: 6 10*3/uL (ref 3.6–11.0)

## 2013-03-11 LAB — COMPREHENSIVE METABOLIC PANEL
Alkaline Phosphatase: 95 U/L
Anion Gap: 5 — ABNORMAL LOW (ref 7–16)
Bilirubin,Total: 0.3 mg/dL (ref 0.2–1.0)
Calcium, Total: 8.7 mg/dL (ref 8.5–10.1)
Chloride: 110 mmol/L — ABNORMAL HIGH (ref 98–107)
Co2: 25 mmol/L (ref 21–32)
Creatinine: 1.13 mg/dL (ref 0.60–1.30)
EGFR (African American): >60
EGFR (Non-African Amer.): >60
Potassium: 4.4 mmol/L (ref 3.5–5.1)
Sodium: 140 mmol/L (ref 136–145)

## 2013-03-11 LAB — URINALYSIS, COMPLETE
Bacteria: NONE SEEN
Bilirubin,UR: NEGATIVE
Glucose,UR: NEGATIVE mg/dL (ref 0–75)
Ketone: NEGATIVE
Ph: 6 (ref 4.5–8.0)
RBC,UR: 1235 /HPF (ref 0–5)
Specific Gravity: 1.014 (ref 1.003–1.030)
WBC UR: 12 /HPF (ref 0–5)

## 2013-03-11 LAB — ACETAMINOPHEN LEVEL: Acetaminophen: <2 ug/mL

## 2013-03-11 NOTE — Telephone Encounter (Signed)
Informed patient that that per Dr. Kirke Corin Renal function is stable. K is high. She needs to follow low Potassium diet. Repeat K stat tomorrow. If this continues to be an issue, we might have to stop Spironolactone. Pt was set up for BMET tomorrow. Patient verbalized understanding.

## 2013-03-12 ENCOUNTER — Other Ambulatory Visit: Payer: Medicaid Other

## 2013-03-14 LAB — LIPID PANEL
Cholesterol: 132 mg/dL (ref 0–200)
Triglycerides: 86 mg/dL (ref 0–200)
VLDL Cholesterol, Calc: 17 mg/dL (ref 5–40)

## 2013-03-15 ENCOUNTER — Encounter: Payer: Self-pay | Admitting: *Deleted

## 2013-03-18 ENCOUNTER — Emergency Department: Payer: Self-pay | Admitting: Emergency Medicine

## 2013-03-18 LAB — URINALYSIS, COMPLETE
Bilirubin,UR: NEGATIVE
Blood: NEGATIVE
Glucose,UR: NEGATIVE mg/dL (ref 0–75)
Ketone: NEGATIVE
Leukocyte Esterase: NEGATIVE
Nitrite: NEGATIVE
Specific Gravity: 1.008 (ref 1.003–1.030)

## 2013-03-18 LAB — ACETAMINOPHEN LEVEL: Acetaminophen: <2 ug/mL

## 2013-03-18 LAB — COMPREHENSIVE METABOLIC PANEL
Albumin: 3.4 g/dL (ref 3.4–5.0)
Alkaline Phosphatase: 110 U/L
Anion Gap: 3 — ABNORMAL LOW (ref 7–16)
BUN: 18 mg/dL (ref 7–18)
Calcium, Total: 8.4 mg/dL — ABNORMAL LOW (ref 8.5–10.1)
Chloride: 104 mmol/L (ref 98–107)
Co2: 29 mmol/L (ref 21–32)
Creatinine: 1.45 mg/dL — ABNORMAL HIGH (ref 0.60–1.30)
Glucose: 89 mg/dL (ref 65–99)
Osmolality: 273 (ref 275–301)
Potassium: 4.7 mmol/L (ref 3.5–5.1)
SGOT(AST): 24 U/L (ref 15–37)
SGPT (ALT): 17 U/L (ref 12–78)
Total Protein: 7.4 g/dL (ref 6.4–8.2)

## 2013-03-18 LAB — CBC
HCT: 33.5 % — ABNORMAL LOW (ref 35.0–47.0)
HGB: 10.9 g/dL — ABNORMAL LOW (ref 12.0–16.0)
MCH: 28.8 pg (ref 26.0–34.0)
Platelet: 207 10*3/uL (ref 150–440)
RDW: 14 % (ref 11.5–14.5)

## 2013-03-18 LAB — SALICYLATE LEVEL: Salicylates, Serum: <1.7 mg/dL

## 2013-03-18 LAB — ETHANOL: Ethanol %: <0.003 % (ref 0.000–0.080)

## 2013-03-22 ENCOUNTER — Ambulatory Visit (INDEPENDENT_AMBULATORY_CARE_PROVIDER_SITE_OTHER): Payer: Medicaid Other | Admitting: Cardiovascular Disease

## 2013-03-22 ENCOUNTER — Encounter: Payer: Self-pay | Admitting: Cardiovascular Disease

## 2013-03-22 VITALS — BP 98/54 | HR 83 | Ht 63.0 in | Wt 299.8 lb

## 2013-03-22 DIAGNOSIS — I5022 Chronic systolic (congestive) heart failure: Secondary | ICD-10-CM

## 2013-03-22 DIAGNOSIS — N182 Chronic kidney disease, stage 2 (mild): Secondary | ICD-10-CM

## 2013-03-22 DIAGNOSIS — R079 Chest pain, unspecified: Secondary | ICD-10-CM

## 2013-03-22 MED ORDER — CARVEDILOL 12.5 MG PO TABS: 12.5000 mg | ORAL_TABLET | Freq: Two times a day (BID) | ORAL | Status: AC

## 2013-03-22 NOTE — Assessment & Plan Note (Signed)
She is slightly fluid overloaded. Currently she is taking Lasix 40 mg once daily. She has been having significant symptoms of orthostatic hypotension. Thus, I will continue the same dose for now. Continue other heart failure medications. Recent basic metabolic profile showed stable renal function. Potassium was 5.7. However, repeat at the hospital showed a potassium of 4.7.

## 2013-03-22 NOTE — Assessment & Plan Note (Signed)
This has been stable on recent labs.

## 2013-03-22 NOTE — Patient Instructions (Signed)
Continue same medications.  Follow up in 4 months.  

## 2013-03-22 NOTE — Progress Notes (Signed)
HPI  This is a 42 year old female who is here today for a followup visit. She has known history of nonischemic cardiomyopathy diagnosed in 2012, chronic systolic heart failure, left bundle branch block, and morbid obesity. She is status post ICD CRT placement managed by Dr. Ladona Ridgel. Cardiomyopathy was presumed to be nonischemic.  She has been doing reasonably well. She has been under significant stress lately after the death of her sister a few months ago. Since then, she has suffered from increased anxiety and depression. She was hospitalized at [facility] recently after an attempted suicide. She was in the hospital for a few days and was seen by psychiatry. She is overall feeling better. She has a followup appointment with her support group and her psychiatrist today. She is relatively stable from a cardiac standpoint with no worsening dyspnea. She describes occasional increased dyspnea when she lies down. No PND.   No Known Allergies   Current Outpatient Prescriptions on File Prior to Visit  Medication Sig Dispense Refill  . furosemide (LASIX) 40 MG tablet Take 40 mg by mouth daily.       Marland Kitchen lisinopril (PRINIVIL,ZESTRIL) 10 MG tablet Take 1 tablet (10 mg total) by mouth daily.  90 tablet  3  . Multiple Vitamins-Minerals (ADULT ONE DAILY GUMMIES) CHEW Chew 2 each by mouth every morning.      Marland Kitchen QUEtiapine (SEROQUEL XR) 300 MG 24 hr tablet Take 300 mg by mouth at bedtime.      Marland Kitchen spironolactone (ALDACTONE) 25 MG tablet Take 12.5 mg by mouth every morning.       . traZODone (DESYREL) 150 MG tablet Take 150 mg by mouth at bedtime.         No current facility-administered medications on file prior to visit.     Past Medical History  Diagnosis Date  . Wide-complex tachycardia     Rate related LBBB - seen by Dr. Graciela Husbands and Dr. Dietrich Pates in the past  . Cardiomyopathy, nonischemic     + murmur; h/o CHF, chest pain; EF-10%  . Anxiety and depression     - sleep study in 12/2011  . Bipolar 1 disorder    . Hypertension   . ICD (implantable cardiac defibrillator) in place 10/02/11    2013; syncope postimplantation attributed to dehydration  . History of blood transfusion 10/2010  . Degenerative joint disease     Bilateral knee; back  . Suicide attempt      Past Surgical History  Procedure Laterality Date  . Cesarean section  1997  . Tubal ligation  1999    Reversal-2002  . Esophagogastroduodenoscopy  11/19/2010    Rehman, MD  . Laparotomy  11/19/2010    Fabio Bering  . Gastrectomy  11/19/2010    "  . Gastrojejunostomy  11/19/2010    "  . Cholecystectomy  11/09/2010    Fabio Bering  . Neuroplasty / transposition median nerve at carpal tunnel bilateral  ~ 2008  . Cardiac defibrillator placement  10/02/11  . Cardiac catheterization       Family History  Problem Relation Age of Onset  . Stroke Father     Died recently, Age 20  . Heart failure Mother   . Diabetes Sister   . Pneumonia Father   . Coronary artery disease Father   . Parkinsonism Mother      History   Social History  . Marital Status: Married    Spouse Name: N/A    Number of  Children: N/A  . Years of Education: N/A   Occupational History  . Not on file.   Social History Main Topics  . Smoking status: Never Smoker   . Smokeless tobacco: Never Used  . Alcohol Use: No  . Drug Use: No  . Sexual Activity: Yes    Birth Control/ Protection: None, Surgical   Other Topics Concern  . Not on file   Social History Narrative  . No narrative on file      PHYSICAL EXAM   BP 98/54  Pulse 83  Ht 5\' 3"  (1.6 m)  Wt 299 lb 12 oz (135.966 kg)  BMI 53.11 kg/m2 Constitutional: She is oriented to person, place, and time. She appears well-developed and well-nourished. No distress.  HENT: No nasal discharge.  Head: Normocephalic and atraumatic.  Eyes: Pupils are equal and round. Right eye exhibits no discharge. Left eye exhibits no discharge.  Neck: Normal range of motion. Neck supple. No JVD present. No  thyromegaly present.  Cardiovascular: Normal rate, regular rhythm, normal heart sounds. Exam reveals no gallop and no friction rub. No murmur heard.  Pulmonary/Chest: Effort normal and breath sounds normal. No stridor. No respiratory distress. She has no wheezes. She has no rales. She exhibits no tenderness.  Abdominal: Soft. Bowel sounds are normal. She exhibits no distension. There is no tenderness. There is no rebound and no guarding.  Musculoskeletal: Normal range of motion. She exhibits no edema and no tenderness.  Neurological: She is alert and oriented to person, place, and time. Coordination normal.  Skin: Skin is warm and dry. No rash noted. She is not diaphoretic. No erythema. No pallor.  Psychiatric: She has a normal mood and affect. Her behavior is normal. Judgment and thought content normal.     NWG:[id number] sinus rhythm with nonspecific IVCD  ASSESSMENT AND PLAN

## 2013-04-12 ENCOUNTER — Ambulatory Visit: Payer: Medicaid Other | Admitting: Internal Medicine

## 2013-04-27 ENCOUNTER — Telehealth: Payer: Self-pay

## 2013-04-27 NOTE — Telephone Encounter (Signed)
Pt called stating that she has been under a lot of stress recently, reports headache that worsens when she bends over, is relieved w/ rest. Pt reports that she does not have a BP monitor at her home. Pt reports that it "felt like a second heart beat under her breast" last night with a "little shock".   She is unsure if her defibrillator fired. She reports that she was in the ED recently w/ EKG changes. Advised pt that she needs to be seen for an evaluation, but she  reports that she does not have a ride. Pt to find out from her "nephew by marriage" can bring her over, but she needs to find out what time he gets off of work. Pt to call for an appt if she is able to get a ride.

## 2013-05-10 ENCOUNTER — Encounter: Payer: Self-pay | Admitting: Internal Medicine

## 2013-05-10 ENCOUNTER — Ambulatory Visit (INDEPENDENT_AMBULATORY_CARE_PROVIDER_SITE_OTHER): Payer: Medicaid Other | Admitting: *Deleted

## 2013-05-10 DIAGNOSIS — I5022 Chronic systolic (congestive) heart failure: Secondary | ICD-10-CM

## 2013-05-10 DIAGNOSIS — I428 Other cardiomyopathies: Secondary | ICD-10-CM

## 2013-05-10 LAB — MDC_IDC_ENUM_SESS_TYPE_INCLINIC
Battery Remaining Longevity: 96 mo
Brady Statistic RA Percent Paced: 1 %
Date Time Interrogation Session: 20150209050000
HIGH POWER IMPEDANCE MEASURED VALUE: 60 Ohm
HighPow Impedance: 82 Ohm
Implantable Pulse Generator Serial Number: 106874
Lead Channel Impedance Value: 1190 Ohm
Lead Channel Impedance Value: 568 Ohm
Lead Channel Pacing Threshold Amplitude: 0.7 V
Lead Channel Pacing Threshold Pulse Width: 0.4 ms
Lead Channel Pacing Threshold Pulse Width: 0.4 ms
Lead Channel Pacing Threshold Pulse Width: 1 ms
Lead Channel Sensing Intrinsic Amplitude: 24.8 mV
Lead Channel Sensing Intrinsic Amplitude: 6 mV
Lead Channel Setting Pacing Amplitude: 1.2 V
Lead Channel Setting Pacing Amplitude: 2.4 V
Lead Channel Setting Pacing Amplitude: 2.4 V
Lead Channel Setting Pacing Pulse Width: 1 ms
MDC IDC MSMT LEADCHNL RA PACING THRESHOLD AMPLITUDE: 1 V
MDC IDC MSMT LEADCHNL RV IMPEDANCE VALUE: 510 Ohm
MDC IDC MSMT LEADCHNL RV PACING THRESHOLD AMPLITUDE: 0.6 V
MDC IDC MSMT LEADCHNL RV SENSING INTR AMPL: 22.3 mV
MDC IDC SET LEADCHNL LV SENSING SENSITIVITY: 1 mV
MDC IDC SET LEADCHNL RV PACING PULSEWIDTH: 0.4 ms
MDC IDC SET LEADCHNL RV SENSING SENSITIVITY: 0.5 mV
MDC IDC SET ZONE DETECTION INTERVAL: 308 ms
MDC IDC STAT BRADY RV PERCENT PACED: 100 %

## 2013-05-10 NOTE — Progress Notes (Signed)
Bi V ICD check in office. 

## 2013-07-22 ENCOUNTER — Encounter: Payer: Self-pay | Admitting: *Deleted

## 2013-07-22 ENCOUNTER — Ambulatory Visit: Payer: Medicaid Other | Admitting: Cardiovascular Disease

## 2013-11-12 ENCOUNTER — Encounter: Payer: Self-pay | Admitting: *Deleted

## 2014-01-04 ENCOUNTER — Encounter: Payer: Self-pay | Admitting: *Deleted

## 2014-02-05 IMAGING — CR DG CHEST 2V
1 series · 3 of 3 positions shown · non-contrast
Comparison: none

REASON FOR EXAM: chest wall pain over pacemaker site. concern for fluid
collection.
COMMENTS:

PROCEDURE:     DXR - DXR CHEST PA (OR AP) AND LATERAL  - January 22, 2013  [DATE]
RESULT:     Two-view chest 01/22/2013
Comparison 12/29/2012

[Series 1: pa · 0.17mm/px · 3 of 3 slices shown]
[im 1/3]
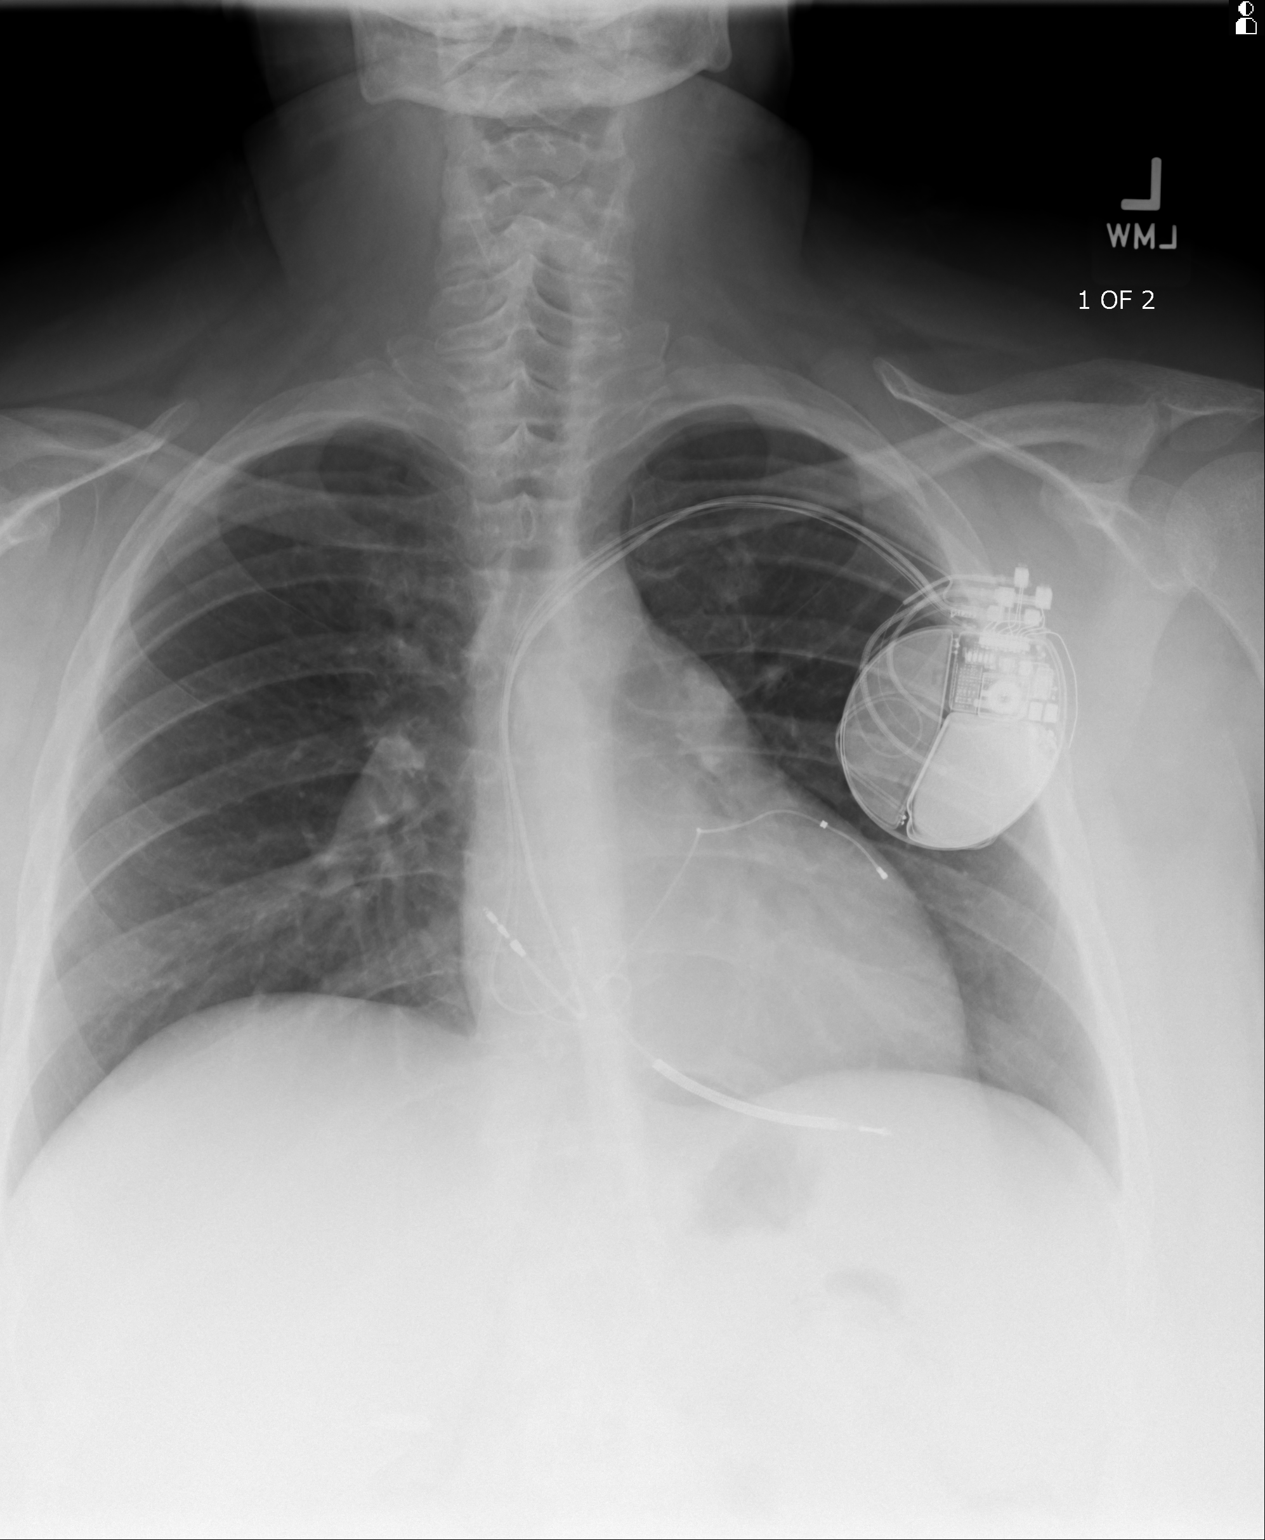
[im 2/3]
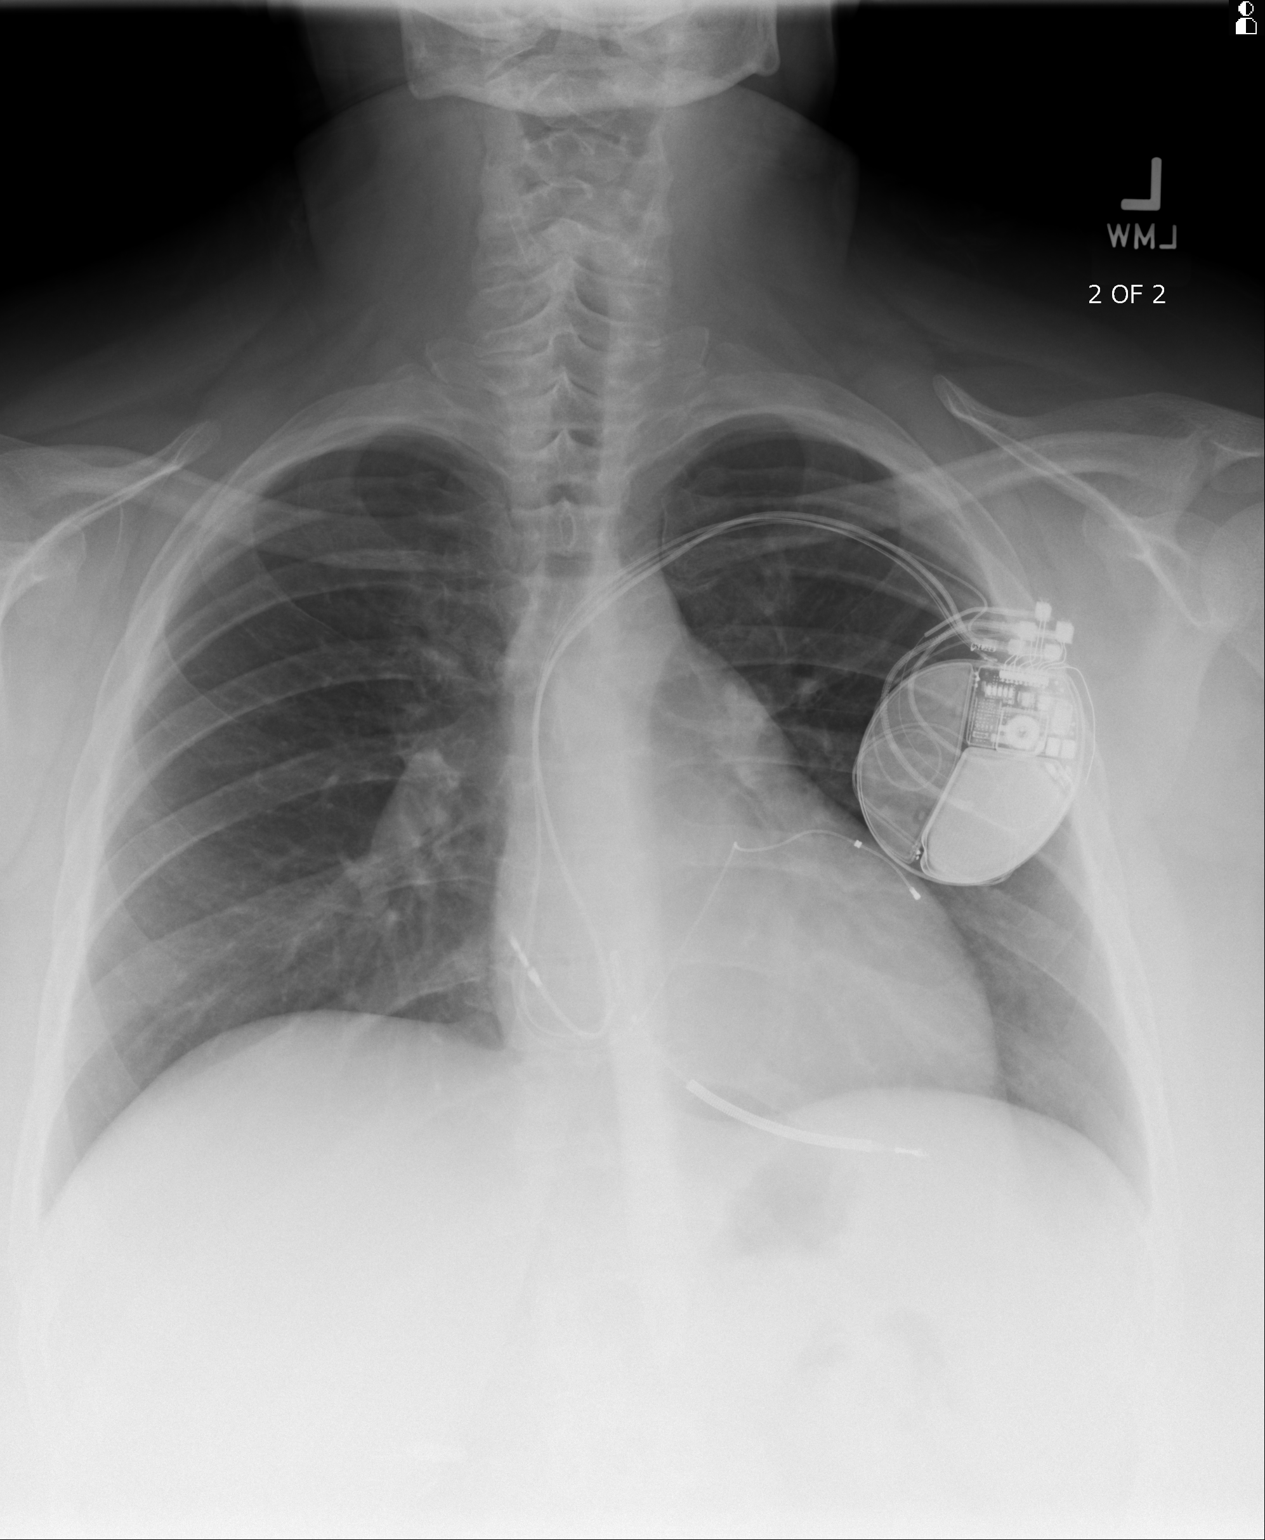
[im 3/3]
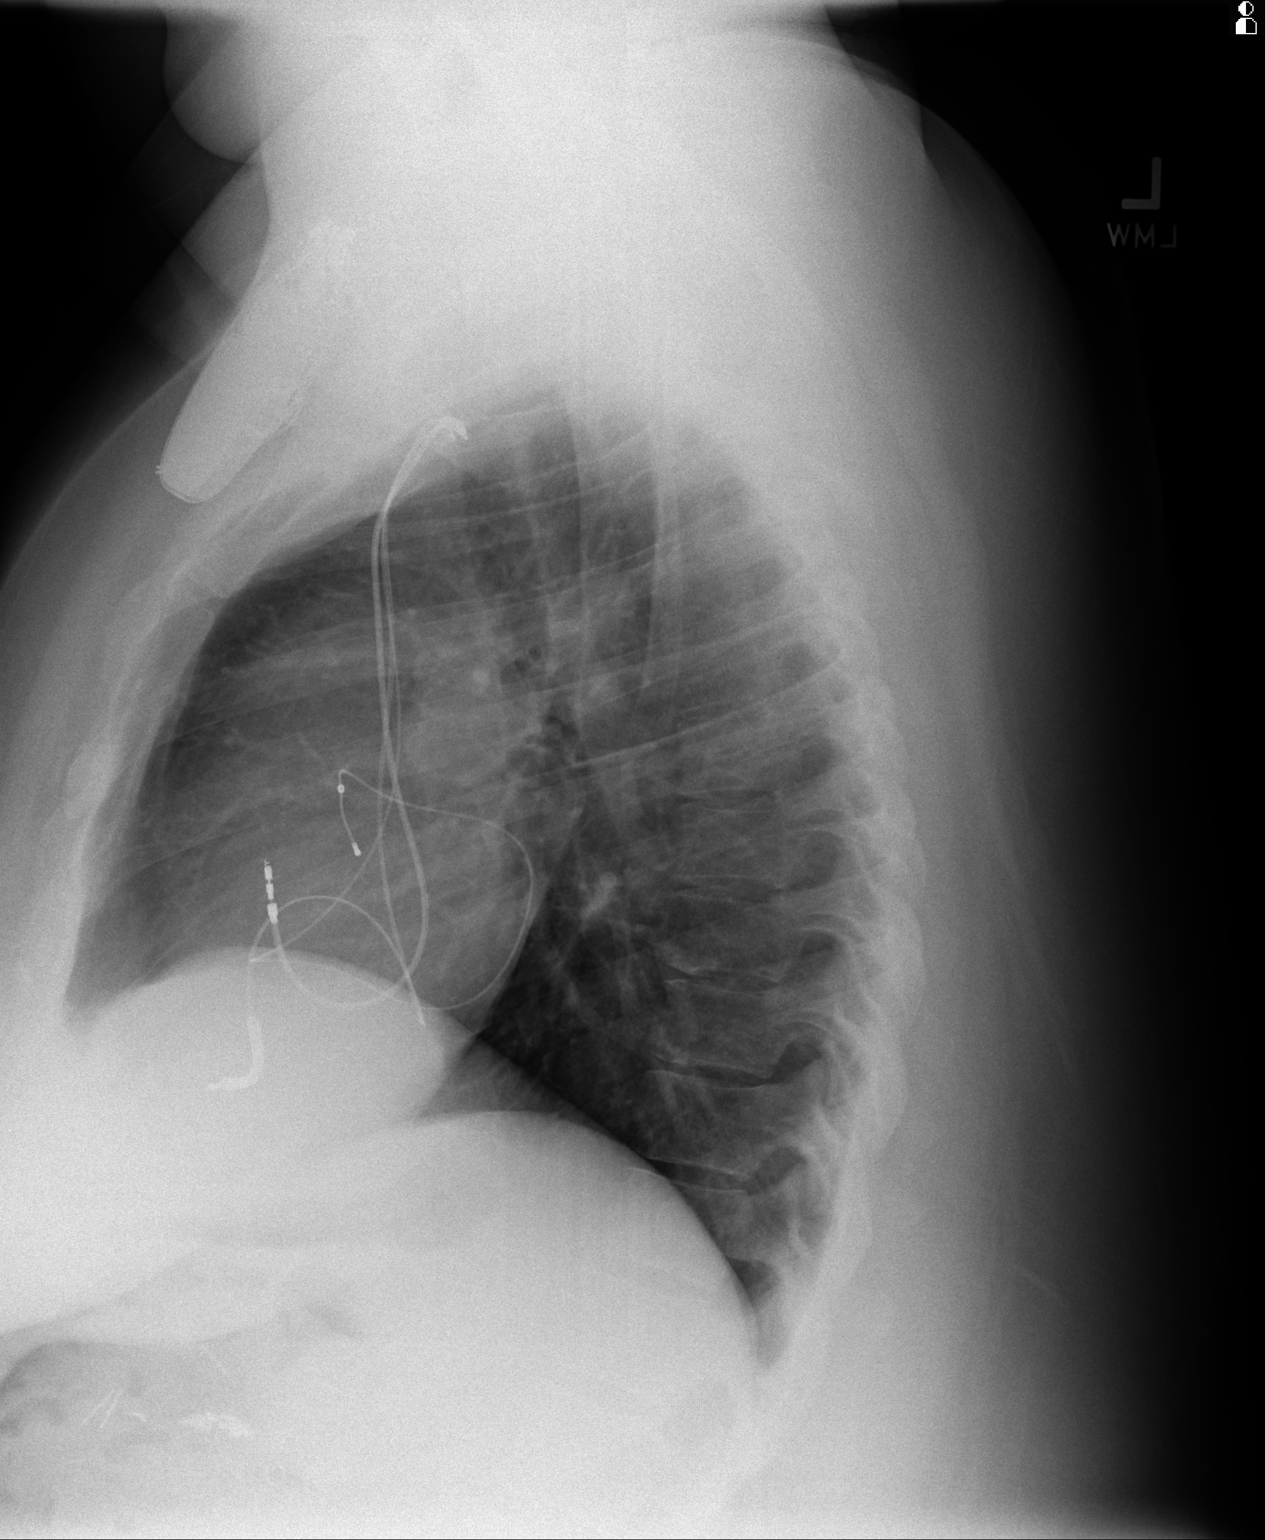

[3 of 3 positions shown; findings below may reference images not displayed]

FINDINGS: Shallow inspiration. The lungs are clear. Cardiac silhouette is
within normal limits. A left-sided pectoralis pacing unit is appreciated
with lead tips projecting in the region of the right atrium and right
ventricle. The visualized bony skeleton is unremarkable.
IMPRESSION: Chest radiograph without evidence of acute cardiopulmonary
disease.

## 2014-02-09 ENCOUNTER — Encounter: Payer: Self-pay | Admitting: *Deleted

## 2014-03-10 ENCOUNTER — Encounter (HOSPITAL_COMMUNITY): Payer: Self-pay | Admitting: Internal Medicine

## 2014-03-29 ENCOUNTER — Encounter: Payer: Self-pay | Admitting: *Deleted

## 2014-04-29 ENCOUNTER — Encounter: Payer: Self-pay | Admitting: *Deleted

## 2014-05-26 ENCOUNTER — Encounter: Payer: Self-pay | Admitting: *Deleted

## 2014-06-30 ENCOUNTER — Encounter: Payer: Self-pay | Admitting: *Deleted

## 2014-07-22 NOTE — Consult Note (Signed)
Brief Consult Note: Diagnosis: Mood disorder NOS.   Patient was seen by consultant.   Consult note dictated.   Recommend further assessment or treatment.   Orders entered.   Comments: M s. Shipps has a h/o depression and mood instability. She was just discharged from [facility]. She took 5 trazodones when wanted to sleep aftger an argument with the family after sleepless night carying for an infant. She is not suicidal or homicidal.  PLAN: 1. The patient no longer meets criteria for IVC, I will terminate proceedings. Please discharge as appropriate.  2. She is to continue medications as prescribed at discharge from [facility]U.  3. She will follow up with [facility] and their community support team.  Electronic Signatures: Kristine Linea (MD)  (Signed 19-Dec-14 15:54)  Authored: Brief Consult Note   Last Updated: 19-Dec-14 15:54 by Kristine Linea (MD)
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
## 2014-07-27 ENCOUNTER — Encounter: Payer: Self-pay | Admitting: *Deleted

## 2014-08-09 ENCOUNTER — Telehealth: Payer: Self-pay | Admitting: Internal Medicine

## 2014-08-09 NOTE — Telephone Encounter (Signed)
Patient is now having her device checked in [place], [place]. I informed patient that she has been marked inactive in our system Environmental consultant). Patient voiced understanding.

## 2014-08-09 NOTE — Telephone Encounter (Signed)
New message        Pt states she has moved and is getting her device checked in [place]
